# Patient Record
Sex: Male | Born: 1965 | Race: White | Hispanic: No | Marital: Married | Smoking: Never smoker
Health system: Southern US, Community
[De-identification: ages and names within clinical notes are randomized; demographics above are authoritative.]

## PROBLEM LIST (undated history)

## (undated) DIAGNOSIS — K469 Unspecified abdominal hernia without obstruction or gangrene: Secondary | ICD-10-CM

## (undated) DIAGNOSIS — J302 Other seasonal allergic rhinitis: Secondary | ICD-10-CM

## (undated) DIAGNOSIS — K5792 Diverticulitis of intestine, part unspecified, without perforation or abscess without bleeding: Secondary | ICD-10-CM

## (undated) DIAGNOSIS — E119 Type 2 diabetes mellitus without complications: Secondary | ICD-10-CM

## (undated) DIAGNOSIS — Z973 Presence of spectacles and contact lenses: Secondary | ICD-10-CM

## (undated) DIAGNOSIS — I10 Essential (primary) hypertension: Secondary | ICD-10-CM

## (undated) DIAGNOSIS — M1990 Unspecified osteoarthritis, unspecified site: Secondary | ICD-10-CM

## (undated) DIAGNOSIS — S060X9A Concussion with loss of consciousness of unspecified duration, initial encounter: Secondary | ICD-10-CM

## (undated) DIAGNOSIS — Z8719 Personal history of other diseases of the digestive system: Secondary | ICD-10-CM

## (undated) DIAGNOSIS — E785 Hyperlipidemia, unspecified: Secondary | ICD-10-CM

## (undated) DIAGNOSIS — I700 Atherosclerosis of aorta: Secondary | ICD-10-CM

## (undated) DIAGNOSIS — K760 Fatty (change of) liver, not elsewhere classified: Secondary | ICD-10-CM

## (undated) DIAGNOSIS — I2510 Atherosclerotic heart disease of native coronary artery without angina pectoris: Secondary | ICD-10-CM

## (undated) DIAGNOSIS — Z9289 Personal history of other medical treatment: Secondary | ICD-10-CM

## (undated) DIAGNOSIS — T7840XA Allergy, unspecified, initial encounter: Secondary | ICD-10-CM

## (undated) DIAGNOSIS — Z460 Encounter for fitting and adjustment of spectacles and contact lenses: Secondary | ICD-10-CM

## (undated) DIAGNOSIS — S060XAA Concussion with loss of consciousness status unknown, initial encounter: Secondary | ICD-10-CM

## (undated) HISTORY — DX: Atherosclerotic heart disease of native coronary artery without angina pectoris: I25.10

## (undated) HISTORY — PX: TRIGGER FINGER RELEASE: SHX641

## (undated) HISTORY — DX: Allergy, unspecified, initial encounter: T78.40XA

## (undated) HISTORY — PX: KNEE ARTHROSCOPY: SUR90

## (undated) HISTORY — DX: Hyperlipidemia, unspecified: E78.5

## (undated) HISTORY — PX: HAND SURGERY: SHX662

## (undated) HISTORY — PX: CORONARY ANGIOPLASTY: SHX604

## (undated) HISTORY — PX: SHOULDER SURGERY: SHX246

---

## 1998-01-08 ENCOUNTER — Emergency Department (HOSPITAL_COMMUNITY): Admission: EM | Admit: 1998-01-08 | Discharge: 1998-01-08 | Payer: Self-pay | Admitting: Emergency Medicine

## 2003-01-16 ENCOUNTER — Encounter: Payer: Self-pay | Admitting: Family Medicine

## 2003-01-16 ENCOUNTER — Encounter: Admission: RE | Admit: 2003-01-16 | Discharge: 2003-01-16 | Payer: Self-pay | Admitting: Family Medicine

## 2003-01-17 ENCOUNTER — Inpatient Hospital Stay (HOSPITAL_COMMUNITY): Admission: EM | Admit: 2003-01-17 | Discharge: 2003-01-21 | Payer: Self-pay | Admitting: Internal Medicine

## 2006-04-28 ENCOUNTER — Ambulatory Visit (HOSPITAL_COMMUNITY): Admission: RE | Admit: 2006-04-28 | Discharge: 2006-04-28 | Payer: Self-pay | Admitting: Orthopaedic Surgery

## 2007-04-20 HISTORY — PX: INGUINAL HERNIA REPAIR: SUR1180

## 2007-10-06 ENCOUNTER — Ambulatory Visit (HOSPITAL_COMMUNITY): Admission: RE | Admit: 2007-10-06 | Discharge: 2007-10-06 | Payer: Self-pay | Admitting: Surgery

## 2008-03-11 ENCOUNTER — Ambulatory Visit: Payer: Self-pay | Admitting: Family Medicine

## 2008-03-11 DIAGNOSIS — M25562 Pain in left knee: Secondary | ICD-10-CM

## 2008-04-19 HISTORY — PX: ELBOW ARTHROTOMY: SUR103

## 2008-09-09 ENCOUNTER — Ambulatory Visit: Payer: Self-pay | Admitting: Sports Medicine

## 2008-09-09 DIAGNOSIS — M25529 Pain in unspecified elbow: Secondary | ICD-10-CM | POA: Insufficient documentation

## 2008-09-09 DIAGNOSIS — M7700 Medial epicondylitis, unspecified elbow: Secondary | ICD-10-CM

## 2008-09-10 ENCOUNTER — Telehealth (INDEPENDENT_AMBULATORY_CARE_PROVIDER_SITE_OTHER): Payer: Self-pay | Admitting: *Deleted

## 2008-10-01 ENCOUNTER — Ambulatory Visit (HOSPITAL_BASED_OUTPATIENT_CLINIC_OR_DEPARTMENT_OTHER): Admission: RE | Admit: 2008-10-01 | Discharge: 2008-10-01 | Payer: Self-pay | Admitting: Orthopaedic Surgery

## 2010-01-05 ENCOUNTER — Ambulatory Visit (HOSPITAL_COMMUNITY): Admission: RE | Admit: 2010-01-05 | Discharge: 2010-01-05 | Payer: Self-pay | Admitting: Orthopaedic Surgery

## 2010-03-10 ENCOUNTER — Ambulatory Visit (HOSPITAL_BASED_OUTPATIENT_CLINIC_OR_DEPARTMENT_OTHER): Admission: RE | Admit: 2010-03-10 | Discharge: 2010-03-10 | Payer: Self-pay | Admitting: Orthopaedic Surgery

## 2010-05-04 ENCOUNTER — Encounter
Admission: RE | Admit: 2010-05-04 | Discharge: 2010-05-19 | Payer: Self-pay | Source: Home / Self Care | Attending: Orthopaedic Surgery | Admitting: Orthopaedic Surgery

## 2010-05-20 ENCOUNTER — Ambulatory Visit: Payer: 59 | Attending: Orthopaedic Surgery | Admitting: Rehabilitation

## 2010-05-20 DIAGNOSIS — M25669 Stiffness of unspecified knee, not elsewhere classified: Secondary | ICD-10-CM | POA: Insufficient documentation

## 2010-05-20 DIAGNOSIS — M25569 Pain in unspecified knee: Secondary | ICD-10-CM | POA: Insufficient documentation

## 2010-05-20 DIAGNOSIS — IMO0001 Reserved for inherently not codable concepts without codable children: Secondary | ICD-10-CM | POA: Insufficient documentation

## 2010-05-29 ENCOUNTER — Ambulatory Visit: Payer: 59 | Admitting: Rehabilitation

## 2010-06-01 ENCOUNTER — Ambulatory Visit: Payer: 59 | Admitting: Rehabilitation

## 2010-06-03 ENCOUNTER — Encounter: Payer: 59 | Admitting: Rehabilitation

## 2010-06-05 ENCOUNTER — Ambulatory Visit: Payer: 59 | Admitting: Physical Therapy

## 2010-06-05 ENCOUNTER — Encounter: Payer: 59 | Admitting: Rehabilitation

## 2010-06-08 ENCOUNTER — Ambulatory Visit: Payer: 59 | Admitting: Rehabilitation

## 2010-06-10 ENCOUNTER — Encounter: Payer: 59 | Admitting: Rehabilitation

## 2010-06-12 ENCOUNTER — Ambulatory Visit: Payer: 59 | Admitting: Rehabilitation

## 2010-06-15 ENCOUNTER — Ambulatory Visit: Payer: 59 | Admitting: Rehabilitation

## 2010-06-18 ENCOUNTER — Ambulatory Visit: Payer: 59 | Admitting: Rehabilitation

## 2010-06-19 ENCOUNTER — Ambulatory Visit: Payer: 59 | Attending: Orthopaedic Surgery | Admitting: Rehabilitation

## 2010-06-19 DIAGNOSIS — IMO0001 Reserved for inherently not codable concepts without codable children: Secondary | ICD-10-CM | POA: Insufficient documentation

## 2010-06-19 DIAGNOSIS — M25569 Pain in unspecified knee: Secondary | ICD-10-CM | POA: Insufficient documentation

## 2010-06-19 DIAGNOSIS — M25669 Stiffness of unspecified knee, not elsewhere classified: Secondary | ICD-10-CM | POA: Insufficient documentation

## 2010-06-22 ENCOUNTER — Encounter: Payer: 59 | Admitting: Physical Therapy

## 2010-06-30 LAB — BASIC METABOLIC PANEL
BUN: 13 mg/dL (ref 6–23)
CO2: 26 mEq/L (ref 19–32)
Calcium: 9.6 mg/dL (ref 8.4–10.5)
Chloride: 99 mEq/L (ref 96–112)
Creatinine, Ser: 0.84 mg/dL (ref 0.4–1.5)
GFR calc Af Amer: >60 mL/min (ref >60)
Glucose, Bld: 205 mg/dL — ABNORMAL HIGH (ref 70–99)

## 2010-06-30 LAB — GLUCOSE, CAPILLARY: Glucose-Capillary: 147 mg/dL — ABNORMAL HIGH (ref 70–99)

## 2010-07-27 LAB — BASIC METABOLIC PANEL
BUN: 16 mg/dL (ref 6–23)
CO2: 26 mEq/L (ref 19–32)
Chloride: 105 mEq/L (ref 96–112)
GFR calc non Af Amer: >60 mL/min (ref >60)
Glucose, Bld: 121 mg/dL — ABNORMAL HIGH (ref 70–99)
Potassium: 3.9 mEq/L (ref 3.5–5.1)
Sodium: 137 mEq/L (ref 135–145)

## 2010-07-27 LAB — GLUCOSE, CAPILLARY: Glucose-Capillary: 169 mg/dL — ABNORMAL HIGH (ref 70–99)

## 2010-09-01 NOTE — Op Note (Signed)
Stephen Arnold, Stephen Arnold           ACCOUNT NO.:  [id number]   MEDICAL RECORD NO.:  [id number]          PATIENT TYPE:  AMB   LOCATION:  DAY                          FACILITY:  [facility]   PHYSICIAN:  Ardeth Sportsman, MD     DATE OF BIRTH:  03/09/1966   DATE OF PROCEDURE:  DATE OF DISCHARGE:                               OPERATIVE REPORT   PRIMARY CARE PHYSICIAN:  Dr. Manus Gunning.   SURGEON:  Karie Soda, MD.   ASSISTANT:  None.   PREOPERATIVE DIAGNOSES:  1. Recurrent left inguinal hernia.  2. Possible right inguinal hernia.   POSTOPERATIVE DIAGNOSES:  1. Recurrent left indirect and direct inguinal hernia.  2. Right indirect and indirect inguinal hernia.   PROCEDURE PERFORMED:  Laparoscopic bilateral preperitoneal exploration  with repair with mesh.   ANESTHESIA:  1. General anesthesia.  Bilateral ilioinguinal/genitofemoral/cord      nerve blocks.  2. Local anesthetic in a field block around all port sites.   SPECIMENS:  None.   DRAINS:  None.   ESTIMATED BLOOD LOSS:  Less than 20 mL.   COMPLICATIONS:  No major complications.   INDICATIONS:  Stephen Arnold is a 45 year old male who had a left  inguinal hernia repair about 20 years ago and felt a pulling discomfort  and pain.  He had evidence of a left inguinal hernia.  He also on my  examination had evidence of a right inguinal hernia.  He was actually  starting to have a little bit of groin discomfort as well.   The anatomy and physiology of abdominal formation and embryology of  testicular migration was explained.  Pathophysiology of inguinal  herniation with its risks of debilitating pain, strangulation and  incarceration was discussed and recommendation was made for laparoscopic  preperitoneal exploration with bilateral inguinal hernia repair.  The  risks and alternatives were discussed.  Questions were answered.   OPERATIVE FINDINGS:  He had obvious indirect and direct inguinal hernias  with a moderate-sized cord  lipoma on the left side.  There was some  scarring around the inguinal region.  On the right he had small but  definite direct and indirect inguinal hernias as well a larger cord  lipoma on the right.   DESCRIPTION OF PROCEDURE:  Informed consent was confirmed.  The patient  voided just prior to going to the operating room.  He received IV  cefazolin just prior to surgery.  He was positioned supine with both  arms tucked.  He underwent general anesthesia without any difficulty.  Stephen abdomen was clipped, prepped and draped in a sterile fashion.   Entry was gained in the preperitoneal space through an infraumbilical  curvilinear incision.  A nick was made in the anterior rectus fascia  just to the right of midline and a 12-mm port was passed in the  preperitoneal plane just posterior to the right rectus abdominis muscle.  Capnopreperitoneum to 15 mL provided good abdominal insufflation.   Attention was turned towards the left side, which was the obvious side.  The peritoneum was freed off the lateral sidewalls in the bilateral  lower quadrants.  When enough  working space was created, the 5-0 ports  being placed in the left and right midabdomen, dissection on the left  side, I freed the peritoneum laterally.  A window was made between the  anterolateral bladder and the anteromedial true pelvis down to the level  of the obturator foramen.  This helped align the inguinal region  borders.  Peritoneum could be seen crawling up into the internal ring,  consistent with an indirect hernia.  There was a separate swath of  peritoneum crawling up medial to the inferior epigastrics, concerning  for a direct hernia as well.  The direct hernia was skeletonized and  reduced down.  The sac was adherent to Stephen iliac vein.  This was  carefully freed off, staying close to the sac and staying away from the  iliac vein without much difficulty.  The cord structures, including the  vas deferens, were freed off  the hernia sac and the sac was reduced out  of the inguinal canal and peeled back as proximally as possible.  The  peritoneum was freed off laterally and superolaterally.   There was a tear in the peritoneum at the hernia as well as one in the  mid midabdomen and left lateral abdomen peritoneum.  These were closed  using 3-0 Vicryl stitches using intracorporeal suturing.   Attention was turned toward the right side.  Peritoneum was freed off.  A large cord lipoma could be seen going into the internal ring along  with a swath of peritoneum consistent with an indirect inguinal hernia.  This was peeled back and the cord lipoma was brought out in pieces  through the port sites.  A swath could be seen going just medial to the  inferior epigastrics for an indirect defect as well although there was  no major incarceration involved.  Dissection was carried out in a mirror  image fashion.  There was a small tear in the peritoneal hernia sac and  this was controlled using intracorporeal suturing as noted above.   A 15 x 15 cm ultra lightweight polypropylene (Ultra Pro) mesh was used  for each side.  On each side the mesh was cut to a half skull shape.  Mesh was placed in a mirror image fashion such that a medial inferior  flap rested in the true pelvis between the anterolateral bladder and  anteromedial pelvic wall.  The mesh laid well posteriorly, laterally,  superiorly and medially such that there was 3 inches circumferential  coverage around the direct and indirect hernia defects.  The lead points  and hernia sacs were grasped and elevated cephalad as capnopreperitoneum  was evacuated.  The ports were removed.  The fascial defect was closed  using 0 Vicryl stitch.  The skin was closed using 4-0 Monocryl stitch.  A sterile dressing was applied.   The patient was extubated and sent to the recovery room in stable  condition.  I explained the postoperative care to the patient and Stephen Arnold just  prior to surgery and reinforced to Stephen Arnold just after  surgery.  I gave her the prescription for 50 Percocet.  Questions were  answered to her satisfaction.  She expressed understanding and  appreciation.      Ardeth Sportsman, MD  Electronically Signed     SCG/MEDQ  D:  10/06/2007  T:  10/06/2007  Job:  [id number]   cc:   Bryan Lemma. Manus Gunning, M.D.  Fax: [phone]

## 2010-09-01 NOTE — Op Note (Signed)
NAMEJADIAN, Stephen Arnold           ACCOUNT NO.:  [id number]   MEDICAL RECORD NO.:  [id number]          PATIENT TYPE:  AMB   LOCATION:  DSC                          FACILITY:  [facility]   PHYSICIAN:  Lubertha Basque. Dalldorf, M.D.DATE OF BIRTH:  1965/08/14   DATE OF PROCEDURE:  10/01/2008  DATE OF DISCHARGE:                               OPERATIVE REPORT   PREOPERATIVE DIAGNOSIS:  Right elbow lateral epicondylitis.   POSTOPERATIVE DIAGNOSIS:  Right elbow lateral epicondylitis.   PROCEDURE:  Right elbow lateral release.   ANESTHESIA:  General.   ATTENDING SURGEON:  Lubertha Basque. Jerl Santos, MD   ASSISTANT:  Lindwood Qua, PA   INDICATIONS FOR PROCEDURE:  The patient is a 45 year old man with a many-  year history of right elbow lateral aspect pain.  This has persisted  despite oral anti-inflammatories and a therapy program as well as  bracing and several injections, all of which have helped transiently.  He has pain which limits his ability to remain active and at times to  rest and he is offered a lateral release procedure.  Informed operative  consent was obtained after discussion of possible complications  including reaction to anesthesia and infection.   SUMMARY, FINDINGS, AND PROCEDURE:  Under general anesthesia through a  small lateral incision, a longitudinal split was made in the extensor  origin at the lateral epicondyle.  Some devitalized tissue was removed  and this was repaired.   DESCRIPTION OF PROCEDURE:  The patient was taken to the operating suite  where general anesthetic was applied without difficulty.  He was  positioned supine and prepped and draped in the normal sterile fashion.  After administration of IV Kefzol, the right arm was elevated,  exsanguinated, tourniquet inflated about the upper arm.  A lateral  incision was made with dissection down to the extensor origin of the  lateral epicondyle.  This was split longitudinally.  I created anterior  and posterior flaps  and removed some devitalized tissue down to bone.  I  then used a rongeur to freshen the bone.  We allowed 1 limb of this to  slip distally a few millimeters and then repaired the fascial layer with  2-0 Vicryl so as not to allow [place] to slip any further.  The tourniquet  was deflated and a small amount of bleeding was easily controlled with  cautery.  Subcutaneous tissues were reapproximated with 2-0 undyed  Vicryl and skin with nylon.  Some Marcaine was injected followed by  Adaptic and a dry gauze dressing with a loose Ace wrap.  Estimated blood  loss and intraoperative fluids can be obtained from anesthesia records  as can accurate tourniquet time.   DISPOSITION:  The patient was extubated in the operating room and taken  to the recovery room in stable addition.  He is to go home on same day  and follow up in my office next week.  I will contact him by phone  tonight.      Lubertha Basque Jerl Santos, M.D.  Electronically Signed     PGD/MEDQ  D:  10/01/2008  T:  10/02/2008  Job:  [id number]

## 2010-09-04 NOTE — H&P (Signed)
NAME:  Stephen Arnold, Stephen Arnold                     ACCOUNT NO.:  [id number]   MEDICAL RECORD NO.:  [id number]                   PATIENT TYPE:  INP   LOCATION:  0342                                 FACILITY:  [facility]   PHYSICIAN:  Corinna L. Lendell Caprice, MD             DATE OF BIRTH:  January 04, 1966   DATE OF ADMISSION:  01/17/2003  DATE OF DISCHARGE:                                HISTORY & PHYSICAL   CHIEF COMPLAINT:  Abdominal pain.   HISTORY OF PRESENT ILLNESS:  Mr. Blaisdell is a 45 year old white male who  has had about a 10-day history of bloating, epigastric pain, anorexia.  He  denies fevers or chills.  The patient is worse with eating.  He has been  taking Vicodin which improves the pain somewhat.  He vomited this morning  which he attributes to taking the pain medication on an empty stomach.  He  has no previous history of pancreatitis.  He does not drink.   PAST MEDICAL HISTORY:  Seasonal allergic rhinitis.   MEDICATIONS:  Allegra, Vicodin.   SOCIAL HISTORY:  The patient is married.  He does not smoke.  He drinks  occasionally although he had not drank to excess recently.  He occasionally  smokes marijuana.   FAMILY HISTORY:  His father has type 2 diabetes.  His grandfather died of  cancer.  He thinks it is colon cancer.  His mother has COPD and heart  problems.   PAST SURGICAL HISTORY:  Herniorrhaphy and knee surgeries.   REVIEW OF SYSTEMS:  CONSTITUTIONAL:  As above.  HEENT:  No headaches, no  sore throat, no odynophagia.  RESPIRATORY:  No cough.  No shortness of  breath.  GI:  As above.  He has not had any diarrhea or hematemesis.  GU:  No dysuria.  MUSCULOSKELETAL:  No arthralgias or myalgias.  SKIN:  No rash.  PSYCHIATRIC:  No depression.  NEUROLOGICAL:  No seizures.  ENDOCRINE:  No  diabetes.  INFECTIOUS:  He had an upper respiratory infection recently.   PHYSICAL EXAMINATION:  VITAL SIGNS:  Temperature is reportedly 99.8, vital  signs are normal.  GENERAL:  The  patient is well-nourished, well-developed in no acute  distress.  HEENT:  Normocephalic, atraumatic.  Pupils equal, round, reactive to light.  Sclerae nonicteric.  Moist mucous membranes.  NECK:  Supple.  No lymphadenopathy.  LUNGS:  Clear to auscultation bilaterally without wheezes, rhonchi, or  rales.  CARDIOVASCULAR:  Regular rate and rhythm without murmurs, rubs, or gallops.  ABDOMEN:  Normal bowel sounds.  Soft.  Mild epigastric tenderness.  No  rebound tenderness.  No distention.  GENITOURINARY AND RECTAL:  Deferred.  EXTREMITIES:  No clubbing, cyanosis, or edema.  SKIN:  No rash.  PSYCHIATRIC:  Normal affect.  NEUROLOGICAL:  Alert and oriented.  Cranial nerves and sensory motor exam  are intact.   LABORATORY AND ACCESSORY DATA:  His amylase from the 28th was 192, his  lipase was 1251, glucose 145, BUN 14, creatinine 0.9, sodium 138, potassium  4.4, chloride 104, bicarbonate 27, calcium 10, total protein 6.8, albumin  4.3, total bilirubin 0.5, alkaline phosphatase 36, AST 13, ALT 13.  White  blood cell count was 10 with a normal differential.  His hemoglobin was  13.4, hematocrit 38.4, platelet count 322.   Ultrasound of the abdomen today showed a nondilated common duct, excessive  gas in the midline which limits visualization of the pancreas.  On one  image, a suspicion of a dilated pancreatic duct measuring 3.5 mm.  CT of the  abdomen without and with contrast showed faint inflammatory stranding about  the pancreatic head and body consistent with pancreatitis.  No mass or  pseudocysts.   ASSESSMENT AND PLAN:  1. Acute pancreatitis:  The patient is not a heavy drinker.  He has no     gallstones.  I will check a triglyceride level.  The patient will be     admitted, given intravenous fluids, antiemetics, pain medications.  He     will be NPO.  I will check serial amylase and lipase measurements and do     serial abdominal examinations.  2. Hyperglycemia.  This will be  monitored and I will check a hemoglobin A1C     if it is elevated.                                               Corinna L. Lendell Caprice, MD    CLS/MEDQ  D:  01/17/2003  T:  01/17/2003  Job:  [id number]   cc:   Bryan Lemma. Manus Gunning, M.D.  [address] [place]  [place] [address]  Fax: [phone]

## 2010-09-04 NOTE — Discharge Summary (Signed)
NAME:  PINCHOS, TOPEL                     ACCOUNT NO.:  [id number]   MEDICAL RECORD NO.:  [id number]                   PATIENT TYPE:  INP   LOCATION:  0342                                 FACILITY[facility]  [facility]   PHYSICIAN:  Corinna L. Lendell Caprice, MD             DATE OF BIRTH:  February 23, 1966   DATE OF ADMISSION:  01/17/2003  DATE OF DISCHARGE:  01/21/2003                                 DISCHARGE SUMMARY   DIAGNOSES:  1. Idiopathic acute pancreatitis.  2. Resolved hyperglycemia.  3. Seasonal allergic rhinitis.   DISCHARGE MEDICATIONS:  1. Phenergan 25 mg p.o. q.6h. p.r.n. nausea/vomiting.  2. Vicodin one to two p.o. q.4h. p.r.n. pain.   ACTIVITY:  Ad lib.   DIET:  Low fat, no alcohol.   FOLLOWUP:  Follow up with Dr. Manus Gunning in two to three weeks.   CONDITION ON DISCHARGE:  Stable.   LABORATORY DATA:  Lipase two days prior to admission was over 1200.  Upon  admission his lipase was 125.  His amylase was normal.  Liver function tests  were completely normal.  Triglyceride level 86.  Basic metabolic panel  normal.  CBC normal.  Serum glucose done two days prior to admission was  about 145.  Special studies on radiology:  Ultrasound done prior to  admission at [facility] showed no gallbladder or liver  pathology, pancreas was not well seen.  CT of the abdomen and pelvis done  prior to admission showed faint inflammatory stranding around the pancreatic  head and body, no masses or pseudocysts were seen.   HISTORY AND HOSPITAL COURSE:  Mr. Borowski is a very pleasant 45 year old  white male who had a several week history of epigastric pain.  He also had  several episodes of vomiting and went to see his primary care physician who  ordered basic blood work as well as liver function tests, amylase, and  lipase.  His lipase was as above.  Ultrasound and CAT scan showed no  evidence of gallstones or pancreatic masses.  He was admitted to the floor,  given IV fluids, IV  antiemetics, and pain medications and made n.p.o.  He  had serial lipase levels drawn.  At the time of discharge, his lipase was  93.  His pain improved and his diet was advanced slowly.  At the time of  discharge  he was tolerating a low fat diet and had minimal pain.  He reports that he  drinks occasionally, but has not been drinking heavily and there was no  history of alcoholism or recent binging.  If this recurs, consider doing an  ERCP at some point.                                               Corinna L. Lendell Caprice, MD  CLS/MEDQ  D:  01/21/2003  T:  01/21/2003  Job:  [id number]   cc:   Bryan Lemma. Manus Gunning, M.D.  [address] [place]  [place] [address]  Fax: [phone]

## 2011-01-14 LAB — BASIC METABOLIC PANEL
CO2: 28
Chloride: 102
Creatinine, Ser: 0.59
GFR calc Af Amer: >60
Sodium: 139

## 2011-01-14 LAB — HEMOGLOBIN AND HEMATOCRIT, BLOOD
HCT: 37.6 — ABNORMAL LOW
Hemoglobin: 12.8 — ABNORMAL LOW

## 2011-02-01 ENCOUNTER — Ambulatory Visit (INDEPENDENT_AMBULATORY_CARE_PROVIDER_SITE_OTHER): Payer: 59 | Admitting: Physician Assistant

## 2011-02-01 ENCOUNTER — Telehealth: Payer: Self-pay

## 2011-02-01 DIAGNOSIS — R079 Chest pain, unspecified: Secondary | ICD-10-CM

## 2011-02-01 NOTE — Telephone Encounter (Signed)
I spoke with pt anda divsed him I am not showing where our office attempted to call him but try Dr. Wende Mott office since he did have a stress test done. Pt verbalized understanding and had no questions

## 2011-02-01 NOTE — Progress Notes (Signed)
   Stephen Arnold is a 45 y.o. male presents for ETT at request of PCP.  He has a h/o DM2 x 4 years.  No h/o CAD.  No HTN or HLP.  Non smoker.  No FHx of CAD.  He notes recent episodes of chest pressure with emotional stress.  Has had some chest discomfort with exertion.  He is able to exert himself without pain as well.  No dyspnea.  No syncope.  Exercise Treadmill Test  Pre-Exercise Testing Evaluation Rhythm: normal sinus  Rate: 81   PR:  .15 QRS:  .09  QT:  .35 QTc: .40     Test  Exercise Tolerance Test Ordering MD: Dr. Thea Silversmith  Interpreting MD:  Tereso Newcomer, PA-C  Unique Test No: 1  Treadmill:  1  Indication for ETT: chest pain - rule out ischemia  Contraindication to ETT: No   Stress Modality: exercise - treadmill  Cardiac Imaging Performed: non   Protocol: standard Bruce - maximal  Max BP:  184/94  Max MPHR (bpm):  174 85% MPR (bpm):  147  MPHR obtained (bpm): 166 % MPHR obtained:  96  Reached 85% MPHR (min:sec) 6:14 Total Exercise Time (min-sec):  8:36  Workload in METS:  13.4 Borg Scale: 17  Reason ETT Terminated:  patient's desire to stop    ST Segment Analysis At Rest: normal ST segments - no evidence of significant ST depression With Exercise: no evidence of significant ST depression  Other Information Arrhythmia:  No Angina during ETT:  absent (0) Quality of ETT:  diagnostic  ETT Interpretation:  normal - no evidence of ischemia by ST analysis  Comments: Good exercise tolerance. No chest pain. Patient stopped due to fatigue and knee pain. Normal BP response to exercise. No ST-T changes to suggest ischemia.   Recommendations: Follow up with PCP as directed.

## 2011-03-15 ENCOUNTER — Encounter: Payer: Self-pay | Admitting: Physician Assistant

## 2011-08-17 ENCOUNTER — Encounter: Payer: Self-pay | Admitting: Sports Medicine

## 2011-08-17 ENCOUNTER — Ambulatory Visit (INDEPENDENT_AMBULATORY_CARE_PROVIDER_SITE_OTHER): Payer: 59 | Admitting: Sports Medicine

## 2011-08-17 VITALS — BP 133/89 | HR 89 | Ht 72.0 in | Wt 215.0 lb

## 2011-08-17 DIAGNOSIS — M2170 Unequal limb length (acquired), unspecified site: Secondary | ICD-10-CM

## 2011-08-17 DIAGNOSIS — IMO0002 Reserved for concepts with insufficient information to code with codable children: Secondary | ICD-10-CM

## 2011-08-17 DIAGNOSIS — M25569 Pain in unspecified knee: Secondary | ICD-10-CM

## 2011-08-17 MED ORDER — TRAMADOL HCL 50 MG PO TABS: 50.0000 mg | ORAL_TABLET | Freq: Four times a day (QID) | ORAL | Status: AC | PRN

## 2011-08-17 NOTE — Assessment & Plan Note (Signed)
We need to make a correction for his leg length difference since he is getting into more varus change and some knee flexion on the left while walking he has functionally A1 to 2 cm short left leg  Plan is to make orthotics to support both arches but also to see if we correct the leg length difference on his return in 1 month

## 2011-08-17 NOTE — Assessment & Plan Note (Signed)
Began tramadol 50 mg 2-3 times daily  We will see if he can reduce his use of ibuprofen  Start glucosamine 1500 mg and chondroitin 1200 mg daily

## 2011-08-17 NOTE — Assessment & Plan Note (Signed)
Think he she has symptoms to suggest a possible new medial meniscus tear   I am concerned that his meniscus injuries most recently are degenerative  His lack of response to his 2011 surgery may suggest that he felt more significant arthritis then is clear from his examination  We will use patellar compression  He needs cushioned insoles to better support his knees particularly with the collapse of his arch bilaterally

## 2011-08-17 NOTE — Patient Instructions (Signed)
Please schedule a follow up appointment for custom orthotics in 1 month  Start tramadol 2-3 times per day as needed for knee pain  Try glucosamine/chondroitin - [employer]'s [place] brand is good.  Dosage is 1500/1200 mg daily  Thank you for seeing [place] today!

## 2011-08-17 NOTE — Progress Notes (Signed)
  Subjective:    Patient ID: Stephen Arnold, male    DOB: [birth year], 46 y.o.   MRN: [id number]  HPI  Pt presents to clinic for evaluation of lt knee pain.  Hx of 3 prior lt knee surgeries. 1980s and 2000 had knees scoped.  2011 had meniscal repair. Lt was post horn of lat meniscus.  Done by Dr Jerl Santos.  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 States his knee pain is now worse than it was before he had the surgeries.  Has significant pain and swelling after doing any type of activity. Takes total of 1600 mg of advil daily.  Had been on vicodin after surgery, but stopped this because he felt it was too strong.  He has stopped all activity because of swelling and pain - really all in left Can't do even easy sports activity even with brace has to prop up and ice after  Review of Systems     Objective:   Physical Exam NAD  Seated gets full extension bilat knees Mild atrophy over VMO on lt Bounce home test neg bilat Varus shift L>R Rt knee 150 deg flexion Lt knee 145 deg flexion Good hip flexion strength on lt Mcmurray's causes pain medially on lt MCL and LCL stable on lt Lachman neg on lt Mild puffiness popliteal space on lt Long arch dropped bilat moderately Standing has more varus shift on lt Lying position- lt leg 1 cm shorter than rt  Lt hip ROM normal  Walking gait- turns both feet out Trendelenburg to the left         Assessment & Plan:

## 2011-09-16 ENCOUNTER — Encounter: Payer: 59 | Admitting: Sports Medicine

## 2011-09-22 ENCOUNTER — Encounter: Payer: 59 | Admitting: Sports Medicine

## 2012-04-19 HISTORY — PX: SHOULDER ARTHROSCOPY W/ ROTATOR CUFF REPAIR: SHX2400

## 2012-04-19 HISTORY — PX: TOOTH EXTRACTION: SUR596

## 2012-07-18 ENCOUNTER — Other Ambulatory Visit (HOSPITAL_COMMUNITY): Payer: Self-pay | Admitting: Orthopaedic Surgery

## 2012-07-18 DIAGNOSIS — M25512 Pain in left shoulder: Secondary | ICD-10-CM

## 2012-07-20 ENCOUNTER — Ambulatory Visit (HOSPITAL_COMMUNITY)
Admission: RE | Admit: 2012-07-20 | Discharge: 2012-07-20 | Disposition: A | Discharge status: Home / Self Care | Payer: 59 | Source: Ambulatory Visit | Attending: Orthopaedic Surgery | Admitting: Orthopaedic Surgery

## 2012-07-20 DIAGNOSIS — M25512 Pain in left shoulder: Secondary | ICD-10-CM

## 2012-08-08 ENCOUNTER — Other Ambulatory Visit (HOSPITAL_COMMUNITY): Payer: Self-pay | Admitting: Orthopaedic Surgery

## 2012-08-08 DIAGNOSIS — M25512 Pain in left shoulder: Secondary | ICD-10-CM

## 2012-08-10 ENCOUNTER — Ambulatory Visit (HOSPITAL_COMMUNITY)
Admission: RE | Admit: 2012-08-10 | Discharge: 2012-08-10 | Disposition: A | Discharge status: Home / Self Care | Payer: 59 | Source: Ambulatory Visit | Attending: Orthopaedic Surgery | Admitting: Orthopaedic Surgery

## 2012-08-10 DIAGNOSIS — M25512 Pain in left shoulder: Secondary | ICD-10-CM

## 2012-08-15 ENCOUNTER — Other Ambulatory Visit (HOSPITAL_COMMUNITY): Payer: Self-pay | Admitting: Orthopaedic Surgery

## 2012-08-15 DIAGNOSIS — R52 Pain, unspecified: Secondary | ICD-10-CM

## 2012-08-15 DIAGNOSIS — R531 Weakness: Secondary | ICD-10-CM

## 2012-08-23 ENCOUNTER — Ambulatory Visit (HOSPITAL_COMMUNITY)
Admission: RE | Admit: 2012-08-23 | Discharge: 2012-08-23 | Disposition: A | Discharge status: Home / Self Care | Payer: 59 | Source: Ambulatory Visit | Attending: Orthopaedic Surgery | Admitting: Orthopaedic Surgery

## 2012-08-23 ENCOUNTER — Encounter (HOSPITAL_COMMUNITY): Payer: Self-pay

## 2012-08-23 DIAGNOSIS — M25519 Pain in unspecified shoulder: Secondary | ICD-10-CM | POA: Insufficient documentation

## 2012-08-23 DIAGNOSIS — R531 Weakness: Secondary | ICD-10-CM

## 2012-08-23 DIAGNOSIS — E119 Type 2 diabetes mellitus without complications: Secondary | ICD-10-CM | POA: Insufficient documentation

## 2012-08-23 DIAGNOSIS — M19019 Primary osteoarthritis, unspecified shoulder: Secondary | ICD-10-CM | POA: Insufficient documentation

## 2012-08-23 DIAGNOSIS — R52 Pain, unspecified: Secondary | ICD-10-CM

## 2012-08-23 HISTORY — DX: Type 2 diabetes mellitus without complications: E11.9

## 2012-08-23 MED ORDER — MIDAZOLAM HCL 2 MG/2ML IJ SOLN
INTRAMUSCULAR | Status: AC
  Administered 2012-08-23: 2 mg via INTRAVENOUS
  Filled 2012-08-23: qty 10

## 2012-08-23 MED ORDER — MIDAZOLAM HCL 2 MG/2ML IJ SOLN
1.0000 mg | INTRAMUSCULAR | Status: DC | PRN
  Administered 2012-08-23 (×3): 2 mg via INTRAVENOUS

## 2012-08-23 MED ORDER — FENTANYL CITRATE 0.05 MG/ML IJ SOLN
INTRAMUSCULAR | Status: AC
  Administered 2012-08-23: 50 ug via INTRAVENOUS
  Filled 2012-08-23: qty 2

## 2012-08-23 MED ORDER — FENTANYL CITRATE 0.05 MG/ML IJ SOLN
25.0000 ug | INTRAMUSCULAR | Status: DC | PRN
  Administered 2012-08-23 (×2): 50 ug via INTRAVENOUS

## 2012-08-23 NOTE — ED Notes (Signed)
Discharge instructions reviewed with patient and his wife and a copy given

## 2012-08-23 NOTE — ED Notes (Signed)
MRI exam completed.

## 2012-08-23 NOTE — H&P (Signed)
Stephen Arnold is an 47 y.o. male.   Chief Complaint: Left shoulder pain Scheduled for left shoulder MRI with sedation Pt unable to tolerate positioning in scanner due to pain HPI: DM  Past Medical History  Diagnosis Date  . Diabetes mellitus without complication     Past Surgical History  Procedure Laterality Date  . Hernia repair Bilateral   . Knee arthroscopy Bilateral   . Hand surgery Right     History reviewed. No pertinent family history. Social History:  reports that he has never smoked. He has never used smokeless tobacco. His alcohol and drug histories are not on file.  Allergies: No Known Allergies   (Not in a hospital admission)  No results found for this or any previous visit (from the past 48 hour(s)). No results found.  Review of Systems  Constitutional: Negative for fever.  Respiratory: Negative for shortness of breath.   Cardiovascular: Negative for chest pain.  Gastrointestinal: Negative for nausea, vomiting and abdominal pain.  Musculoskeletal: Positive for joint pain.       Lt shoulder pain  Neurological: Negative for weakness and headaches.    Blood pressure 122/83, pulse 75, temperature 97.6 F (36.4 C), SpO2 100.00%. Physical Exam  Constitutional: He is oriented to person, place, and time. He appears well-developed and well-nourished.  Cardiovascular: Normal rate, regular rhythm and normal heart sounds.   No murmur heard. Respiratory: Effort normal and breath sounds normal. He has no wheezes.  GI: Soft. Bowel sounds are normal. There is no tenderness.  Musculoskeletal: Normal range of motion.  Left shoulder pain  Neurological: He is alert and oriented to person, place, and time.  Skin: Skin is warm and dry.  Psychiatric: He has a normal mood and affect. His behavior is normal. Judgment and thought content normal.     Assessment/Plan Left shoulder pain Scheduled for MRI with sedation Pt aware of procedure benefits and risks and  agreeable to proceed Consent signed and in chart  Marry Kusch A 08/23/2012, 12:39 PM

## 2012-08-23 NOTE — ED Notes (Signed)
MRI exam started 

## 2012-11-16 ENCOUNTER — Other Ambulatory Visit: Payer: Self-pay | Admitting: Orthopaedic Surgery

## 2012-12-05 ENCOUNTER — Encounter (HOSPITAL_BASED_OUTPATIENT_CLINIC_OR_DEPARTMENT_OTHER): Payer: Self-pay | Admitting: *Deleted

## 2012-12-05 NOTE — Progress Notes (Signed)
Pt has had several surgeries here-to come by for bmet-ekg-has no cardiac or resp problems-diabetic controlled

## 2012-12-06 NOTE — H&P (Signed)
Stephen Arnold is an 47 y.o. male.   Chief Complaint: Left shoulder pain HPI: Stephen Arnold has been having left shoulder pain for some time.  He is having trouble sleeping at nighttime and using his arm comfortably.  Any work at shoulder level height or higher is very painful for him.  He has had 3 different injections which helped only temporarily.  A recent MRI scan was positive for a.c. Joint degeneration and subacromial bursitis/rotator cuff tendinitis.  Have discussed proceeding with a shoulder arthroscopy, acromial decompression and a.c. Resection.  My hope is that this will regain his function and relieve his pain.  Past Medical History  Diagnosis Date  . Diabetes mellitus without complication   . Seasonal allergies   . Contact lens/glasses fitting     wears contacts  . GERD (gastroesophageal reflux disease)     occ-no meds    Past Surgical History  Procedure Laterality Date  . Knee arthroscopy  2011    lt  . Hand surgery Right     rt small finger  . Hernia repair Bilateral 2009  . Knee arthroscopy      right  . Elbow arthrotomy  2010    rt  . Tooth extraction  2014    History reviewed. No pertinent family history. Social History:  reports that he has never smoked. He has never used smokeless tobacco. He reports that  drinks alcohol. He reports that he does not use illicit drugs.  Allergies: No Known Allergies  No prescriptions prior to admission    No results found for this or any previous visit (from the past 48 hour(s)). No results found.  Review of Systems  Musculoskeletal: Positive for joint pain.  All other systems reviewed and are negative.    Height 6' (1.829 m), weight 97.523 kg (215 lb). Physical Exam  Constitutional: He is oriented to person, place, and time. He appears well-nourished.  HENT:  Head: Atraumatic.  Eyes: Conjunctivae are normal.  Neck: Neck supple.  Cardiovascular: Regular rhythm.   Respiratory: Breath sounds normal.  GI: Bowel  sounds are normal.  Musculoskeletal:  Left shoulder exam: Range of motion is good but he has.  Painful impingement in 2 positions.  Rotator cuff strength is good but very painful with testing.  There is pain at the a.c. Joint with cross chest test maneuver and direct pressure.  Elbow and wrist motion normal.  Neurological: He is oriented to person, place, and time.  Skin: Skin is dry.  Psychiatric: He has a normal mood and affect.     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 Plan: Stephen Arnold is having continued shoulder pain despite injections mentioned above.  We have discussed, Resectinghis a.c. Joint spurs and also doing a subacromial decompression to relieve his pain and improve his function.  We did review the risks of anesthesia, infection associated with outpatient  Shoulder arthroscopy.  Stephen Arnold R 12/06/2012, 6:11 PM

## 2012-12-11 ENCOUNTER — Encounter (HOSPITAL_BASED_OUTPATIENT_CLINIC_OR_DEPARTMENT_OTHER)
Admission: RE | Admit: 2012-12-11 | Discharge: 2012-12-11 | Disposition: A | Discharge status: Home / Self Care | Payer: 59 | Source: Ambulatory Visit | Attending: Orthopaedic Surgery | Admitting: Orthopaedic Surgery

## 2012-12-11 LAB — BASIC METABOLIC PANEL
Calcium: 9.7 mg/dL (ref 8.4–10.5)
Chloride: 98 mEq/L (ref 96–112)
GFR calc non Af Amer: >90 mL/min (ref >90)
Glucose, Bld: 377 mg/dL — ABNORMAL HIGH (ref 70–99)
Sodium: 137 mEq/L (ref 135–145)

## 2012-12-11 NOTE — Progress Notes (Signed)
Discussed BMP glucose of 377 with Dr. Sampson Goon who plans to check CBG DOS in pre op and readdress at that time.

## 2012-12-12 ENCOUNTER — Encounter (HOSPITAL_BASED_OUTPATIENT_CLINIC_OR_DEPARTMENT_OTHER): Payer: Self-pay | Admitting: *Deleted

## 2012-12-12 ENCOUNTER — Encounter (HOSPITAL_BASED_OUTPATIENT_CLINIC_OR_DEPARTMENT_OTHER)
Admission: RE | Disposition: A | Discharge status: Home / Self Care | Payer: Self-pay | Source: Ambulatory Visit | Attending: Orthopaedic Surgery

## 2012-12-12 ENCOUNTER — Ambulatory Visit (HOSPITAL_BASED_OUTPATIENT_CLINIC_OR_DEPARTMENT_OTHER)
Admission: RE | Admit: 2012-12-12 | Discharge: 2012-12-12 | Disposition: A | Discharge status: Home / Self Care | Payer: 59 | Source: Ambulatory Visit | Attending: Orthopaedic Surgery | Admitting: Orthopaedic Surgery

## 2012-12-12 ENCOUNTER — Ambulatory Visit (HOSPITAL_BASED_OUTPATIENT_CLINIC_OR_DEPARTMENT_OTHER): Payer: 59 | Admitting: *Deleted

## 2012-12-12 DIAGNOSIS — M7542 Impingement syndrome of left shoulder: Secondary | ICD-10-CM

## 2012-12-12 DIAGNOSIS — E119 Type 2 diabetes mellitus without complications: Secondary | ICD-10-CM | POA: Insufficient documentation

## 2012-12-12 DIAGNOSIS — Z01812 Encounter for preprocedural laboratory examination: Secondary | ICD-10-CM | POA: Insufficient documentation

## 2012-12-12 DIAGNOSIS — Z01810 Encounter for preprocedural cardiovascular examination: Secondary | ICD-10-CM | POA: Insufficient documentation

## 2012-12-12 DIAGNOSIS — M19019 Primary osteoarthritis, unspecified shoulder: Secondary | ICD-10-CM | POA: Insufficient documentation

## 2012-12-12 DIAGNOSIS — M25819 Other specified joint disorders, unspecified shoulder: Secondary | ICD-10-CM | POA: Insufficient documentation

## 2012-12-12 HISTORY — DX: Other seasonal allergic rhinitis: J30.2

## 2012-12-12 HISTORY — DX: Encounter for fitting and adjustment of spectacles and contact lenses: Z46.0

## 2012-12-12 LAB — POCT HEMOGLOBIN-HEMACUE: Hemoglobin: 10.7 g/dL — ABNORMAL LOW (ref 13.0–17.0)

## 2012-12-12 SURGERY — SHOULDER ARTHROSCOPY WITH SUBACROMIAL DECOMPRESSION AND DISTAL CLAVICLE EXCISION
Anesthesia: Regional | Site: Shoulder | Laterality: Left | Wound class: Clean

## 2012-12-12 MED ORDER — SODIUM CHLORIDE 0.9 % IR SOLN
Status: DC | PRN
  Administered 2012-12-12: 9000 mL

## 2012-12-12 MED ORDER — FENTANYL CITRATE 0.05 MG/ML IJ SOLN
INTRAMUSCULAR | Status: DC | PRN
  Administered 2012-12-12 (×3): 25 ug via INTRAVENOUS

## 2012-12-12 MED ORDER — LACTATED RINGERS IV SOLN: INTRAVENOUS | Status: DC

## 2012-12-12 MED ORDER — OXYCODONE HCL 5 MG/5ML PO SOLN: 5.0000 mg | Freq: Once | ORAL | Status: DC | PRN

## 2012-12-12 MED ORDER — ONDANSETRON HCL 4 MG/2ML IJ SOLN
INTRAMUSCULAR | Status: DC | PRN
  Administered 2012-12-12: 4 mg via INTRAVENOUS

## 2012-12-12 MED ORDER — HYDROMORPHONE HCL PF 1 MG/ML IJ SOLN: 0.2500 mg | INTRAMUSCULAR | Status: DC | PRN

## 2012-12-12 MED ORDER — BUPIVACAINE-EPINEPHRINE PF 0.5-1:200000 % IJ SOLN
INTRAMUSCULAR | Status: DC | PRN
  Administered 2012-12-12: 30 mL

## 2012-12-12 MED ORDER — OXYCODONE HCL 5 MG PO TABS: 5.0000 mg | ORAL_TABLET | Freq: Once | ORAL | Status: DC | PRN

## 2012-12-12 MED ORDER — DEXAMETHASONE SODIUM PHOSPHATE 4 MG/ML IJ SOLN
INTRAMUSCULAR | Status: DC | PRN
  Administered 2012-12-12: 10 mg via INTRAVENOUS

## 2012-12-12 MED ORDER — CHLORHEXIDINE GLUCONATE 4 % EX LIQD: 60.0000 mL | Freq: Once | CUTANEOUS | Status: DC

## 2012-12-12 MED ORDER — PROPOFOL 10 MG/ML IV BOLUS
INTRAVENOUS | Status: DC | PRN
  Administered 2012-12-12: 100 mg via INTRAVENOUS
  Administered 2012-12-12: 50 mg via INTRAVENOUS
  Administered 2012-12-12: 200 mg via INTRAVENOUS
  Administered 2012-12-12: 100 mg via INTRAVENOUS

## 2012-12-12 MED ORDER — FENTANYL CITRATE 0.05 MG/ML IJ SOLN
50.0000 ug | INTRAMUSCULAR | Status: DC | PRN
  Administered 2012-12-12: 100 ug via INTRAVENOUS

## 2012-12-12 MED ORDER — GLYCOPYRROLATE 0.2 MG/ML IJ SOLN
INTRAMUSCULAR | Status: DC | PRN
  Administered 2012-12-12: 0.2 mg via INTRAVENOUS

## 2012-12-12 MED ORDER — LACTATED RINGERS IV SOLN
INTRAVENOUS | Status: DC
  Administered 2012-12-12 (×2): via INTRAVENOUS

## 2012-12-12 MED ORDER — CEFAZOLIN SODIUM-DEXTROSE 2-3 GM-% IV SOLR
2.0000 g | INTRAVENOUS | Status: AC
  Administered 2012-12-12: 2 g via INTRAVENOUS

## 2012-12-12 MED ORDER — SUCCINYLCHOLINE CHLORIDE 20 MG/ML IJ SOLN
INTRAMUSCULAR | Status: DC | PRN
  Administered 2012-12-12: 100 mg via INTRAVENOUS

## 2012-12-12 MED ORDER — MIDAZOLAM HCL 2 MG/2ML IJ SOLN
1.0000 mg | INTRAMUSCULAR | Status: DC | PRN
  Administered 2012-12-12: 2 mg via INTRAVENOUS

## 2012-12-12 MED ORDER — OXYCODONE-ACETAMINOPHEN 5-325 MG PO TABS: 1.0000 | ORAL_TABLET | Freq: Four times a day (QID) | ORAL | Status: DC | PRN

## 2012-12-12 SURGICAL SUPPLY — 75 items
ADH SKN CLS APL DERMABOND .7 (GAUZE/BANDAGES/DRESSINGS)
APL SKNCLS STERI-STRIP NONHPOA (GAUZE/BANDAGES/DRESSINGS)
BENZOIN TINCTURE PRP APPL 2/3 (GAUZE/BANDAGES/DRESSINGS) IMPLANT
BLADE CUDA 5.5 (BLADE) IMPLANT
BLADE GREAT WHITE 4.2 (BLADE) ×2 IMPLANT
BLADE SURG 15 STRL LF DISP TIS (BLADE) IMPLANT
BLADE SURG 15 STRL SS (BLADE)
BUR VERTEX HOODED 4.5 (BURR) ×2 IMPLANT
CANISTER OMNI JUG 16 LITER (MISCELLANEOUS) IMPLANT
CANISTER SUCTION 2500CC (MISCELLANEOUS) IMPLANT
CANNULA SHOULDER 7CM (CANNULA) ×2 IMPLANT
CANNULA TWIST IN 8.25X7CM (CANNULA) IMPLANT
DECANTER SPIKE VIAL GLASS SM (MISCELLANEOUS) IMPLANT
DERMABOND ADVANCED (GAUZE/BANDAGES/DRESSINGS)
DERMABOND ADVANCED .7 DNX12 (GAUZE/BANDAGES/DRESSINGS) IMPLANT
DRAPE STERI 35X30 U-POUCH (DRAPES) ×2 IMPLANT
DRAPE U-SHAPE 47X51 STRL (DRAPES) ×2 IMPLANT
DRAPE U-SHAPE 76X120 STRL (DRAPES) ×4 IMPLANT
DRSG EMULSION OIL 3X3 NADH (GAUZE/BANDAGES/DRESSINGS) ×2 IMPLANT
DRSG PAD ABDOMINAL 8X10 ST (GAUZE/BANDAGES/DRESSINGS) ×2 IMPLANT
DURAPREP 26ML APPLICATOR (WOUND CARE) ×2 IMPLANT
ELECT MENISCUS 165MM 90D (ELECTRODE) IMPLANT
ELECT REM PT RETURN 9FT ADLT (ELECTROSURGICAL) ×2
ELECTRODE REM PT RTRN 9FT ADLT (ELECTROSURGICAL) ×1 IMPLANT
GLOVE BIO SURGEON STRL SZ 6.5 (GLOVE) ×2 IMPLANT
GLOVE BIO SURGEON STRL SZ7 (GLOVE) ×2 IMPLANT
GLOVE BIO SURGEON STRL SZ8.5 (GLOVE) ×2 IMPLANT
GLOVE BIOGEL PI IND STRL 7.5 (GLOVE) ×1 IMPLANT
GLOVE BIOGEL PI IND STRL 8 (GLOVE) ×1 IMPLANT
GLOVE BIOGEL PI IND STRL 8.5 (GLOVE) ×1 IMPLANT
GLOVE BIOGEL PI INDICATOR 7.5 (GLOVE) ×1
GLOVE BIOGEL PI INDICATOR 8 (GLOVE) ×1
GLOVE BIOGEL PI INDICATOR 8.5 (GLOVE) ×1
GLOVE EXAM NITRILE MD LF STRL (GLOVE) ×2 IMPLANT
GLOVE SS BIOGEL STRL SZ 8 (GLOVE) ×1 IMPLANT
GLOVE SUPERSENSE BIOGEL SZ 8 (GLOVE) ×1
GLOVE SURG SS PI 7.0 STRL IVOR (GLOVE) ×2 IMPLANT
GOWN PREVENTION PLUS XLARGE (GOWN DISPOSABLE) ×4 IMPLANT
GOWN PREVENTION PLUS XXLARGE (GOWN DISPOSABLE) ×6 IMPLANT
IV NS IRRIG 3000ML ARTHROMATIC (IV SOLUTION) ×6 IMPLANT
NDL SUT 6 .5 CRC .975X.05 MAYO (NEEDLE) IMPLANT
NEEDLE MAYO TAPER (NEEDLE)
NEEDLE SCORPION MULTI FIRE (NEEDLE) IMPLANT
NS IRRIG 1000ML POUR BTL (IV SOLUTION) IMPLANT
PACK ARTHROSCOPY DSU (CUSTOM PROCEDURE TRAY) ×2 IMPLANT
PACK BASIN DAY SURGERY FS (CUSTOM PROCEDURE TRAY) ×2 IMPLANT
PASSER SUT SWANSON 36MM LOOP (INSTRUMENTS) IMPLANT
PENCIL BUTTON HOLSTER BLD 10FT (ELECTRODE) IMPLANT
SET ARTHROSCOPY TUBING (MISCELLANEOUS) ×2
SET ARTHROSCOPY TUBING LN (MISCELLANEOUS) ×1 IMPLANT
SHEET MEDIUM DRAPE 40X70 STRL (DRAPES) ×2 IMPLANT
SLEEVE SCD COMPRESS KNEE MED (MISCELLANEOUS) IMPLANT
SLING ARM FOAM STRAP LRG (SOFTGOODS) ×2 IMPLANT
SLING ARM FOAM STRAP MED (SOFTGOODS) IMPLANT
SLING ARM FOAM STRAP SML (SOFTGOODS) IMPLANT
SLING ARM FOAM STRAP XLG (SOFTGOODS) ×2 IMPLANT
SPONGE GAUZE 4X4 12PLY (GAUZE/BANDAGES/DRESSINGS) ×2 IMPLANT
SPONGE LAP 4X18 X RAY DECT (DISPOSABLE) IMPLANT
STRIP CLOSURE SKIN 1/2X4 (GAUZE/BANDAGES/DRESSINGS) IMPLANT
SUCTION FRAZIER TIP 10 FR DISP (SUCTIONS) IMPLANT
SUT ETHIBOND 2 OS 4 DA (SUTURE) IMPLANT
SUT ETHILON 3 0 PS 1 (SUTURE) ×2 IMPLANT
SUT FIBERWIRE #2 38 T-5 BLUE (SUTURE)
SUT PDS AB 2-0 CT2 27 (SUTURE) IMPLANT
SUT VIC AB 0 SH 27 (SUTURE) IMPLANT
SUT VIC AB 2-0 SH 27 (SUTURE)
SUT VIC AB 2-0 SH 27XBRD (SUTURE) IMPLANT
SUT VICRYL 4-0 PS2 18IN ABS (SUTURE) IMPLANT
SUTURE FIBERWR #2 38 T-5 BLUE (SUTURE) IMPLANT
SYR BULB 3OZ (MISCELLANEOUS) IMPLANT
TOWEL OR 17X24 6PK STRL BLUE (TOWEL DISPOSABLE) ×2 IMPLANT
TOWEL OR NON WOVEN STRL DISP B (DISPOSABLE) IMPLANT
WAND STAR VAC 90 (SURGICAL WAND) ×2 IMPLANT
WATER STERILE IRR 1000ML POUR (IV SOLUTION) ×2 IMPLANT
YANKAUER SUCT BULB TIP NO VENT (SUCTIONS) IMPLANT

## 2012-12-12 NOTE — Interval H&P Note (Signed)
History and Physical Interval Note:  12/12/2012 12:59 PM  Stephen Arnold  has presented today for surgery, with the diagnosis of LEFT IMPINGEMENT SYNDROME AND [facility] JOINT ARTHRITIS  The various methods of treatment have been discussed with the patient and family. After consideration of risks, benefits and other options for treatment, the patient has consented to  Procedure(s): ARTHROSCOPY SHOULDER (Left) as a surgical intervention .  The patient's history has been reviewed, patient examined, no change in status, stable for surgery.  I have reviewed the patient's chart and labs.  Questions were answered to the patient's satisfaction.     [name]

## 2012-12-12 NOTE — Op Note (Signed)
#  [id number] 

## 2012-12-12 NOTE — Anesthesia Procedure Notes (Addendum)
Anesthesia Regional Block:  Interscalene brachial plexus block  Pre-Anesthetic Checklist: ,, timeout performed, Correct Patient, Correct Site, Correct Laterality, Correct Procedure, Correct Position, site marked, Risks and benefits discussed, pre-op evaluation,  At surgeon's request and post-op pain management  Laterality: Left  Prep: Maximum Sterile Barrier Precautions used and chloraprep       Needles:  Injection technique: Single-shot  Needle Type: Echogenic Stimulator Needle     Needle Length: 5cm 5 cm Needle Gauge: 22 and 22 G    Additional Needles:  Procedures: ultrasound guided (picture in chart) and nerve stimulator Interscalene brachial plexus block  Nerve Stimulator or Paresthesia:  Response: Biceps response,   Additional Responses:   Narrative:  Start time: 12/12/2012 12:26 PM End time: 12/12/2012 12:37 PM Injection made incrementally with aspirations every 5 mL. Anesthesiologist: Sampson Goon, MD  Additional Notes: 2% Lidocaine skin wheel.   Interscalene brachial plexus block Procedure Name: Intubation Date/Time: 12/12/2012 1:41 PM Performed by: Gar Gibbon Pre-anesthesia Checklist: Patient identified, Emergency Drugs available, Suction available and Patient being monitored Patient Re-evaluated:Patient Re-evaluated prior to inductionOxygen Delivery Method: Circle System Utilized Preoxygenation: Pre-oxygenation with 100% oxygen Intubation Type: IV induction Ventilation: Mask ventilation without difficulty Laryngoscope Size: Miller and 3 Grade View: Grade II Tube type: Oral Tube size: 8.0 mm Number of attempts: 4 Airway Equipment and Method: stylet,  oral airway and Video-laryngoscopy Placement Confirmation: positive ETCO2 and breath sounds checked- equal and bilateral Secured at: 23 cm Tube secured with: Tape Dental Injury: Bloody posterior oropharynx  Difficulty Due To: Difficulty was unanticipated and Difficult Airway- due to anterior  larynx Future Recommendations: Recommend- induction with short-acting agent, and alternative techniques readily available

## 2012-12-12 NOTE — Progress Notes (Signed)
Assisted Dr. Fitzgerald with left, ultrasound guided, supraclavicular block. Side rails up, monitors on throughout procedure. See vital signs in flow sheet. Tolerated Procedure well. 

## 2012-12-12 NOTE — Transfer of Care (Signed)
Immediate Anesthesia Transfer of Care Note  Patient: Stephen Arnold  Procedure(s) Performed: Procedure(s): SHOULDER ARTHROSCOPY WITH SUBACROMIAL DECOMPRESSION AND DISTAL CLAVICLE EXCISION (Left)  Patient Location: PACU  Anesthesia Type:GA combined with regional for post-op pain  Level of Consciousness: awake, sedated and patient cooperative  Airway & Oxygen Therapy: Patient Spontanous Breathing and Patient connected to face mask oxygen  Post-op Assessment: Report given to PACU RN and Post -op Vital signs reviewed and stable  Post vital signs: Reviewed and stable  Complications: No apparent anesthesia complications

## 2012-12-12 NOTE — Anesthesia Postprocedure Evaluation (Signed)
Anesthesia Post Note  Patient: Stephen Arnold  Procedure(s) Performed: Procedure(s) (LRB): SHOULDER ARTHROSCOPY WITH SUBACROMIAL DECOMPRESSION AND DISTAL CLAVICLE EXCISION (Left)  Anesthesia type: general  Patient location: PACU  Post pain: Pain level controlled  Post assessment: Patient's Cardiovascular Status Stable  Last Vitals:  Filed Vitals:   12/12/12 1530  BP:   Pulse: 84  Temp:   Resp: 18    Post vital signs: Reviewed and stable  Level of consciousness: sedated  Complications: No apparent anesthesia complications

## 2012-12-12 NOTE — Anesthesia Preprocedure Evaluation (Signed)
Anesthesia Evaluation  Patient identified by MRN, date of birth, ID band Patient awake    Reviewed: Allergy & Precautions, H&P , NPO status , Patient's Chart, lab work & pertinent test results  Airway Mallampati: II TM Distance: >3 FB Neck ROM: Full    Dental no notable dental hx. (+) Teeth Intact and Dental Advisory Given   Pulmonary neg pulmonary ROS,  breath sounds clear to auscultation  Pulmonary exam normal       Cardiovascular negative cardio ROS  Rhythm:Regular Rate:Normal     Neuro/Psych negative neurological ROS  negative psych ROS   GI/Hepatic Neg liver ROS, GERD-  Medicated and Controlled,  Endo/Other  diabetes, Type 2, Oral Hypoglycemic Agents  Renal/GU negative Renal ROS  negative genitourinary   Musculoskeletal   Abdominal   Peds  Hematology negative hematology ROS (+)   Anesthesia Other Findings   Reproductive/Obstetrics negative OB ROS                           Anesthesia Physical Anesthesia Plan  ASA: II  Anesthesia Plan: General and Regional   Post-op Pain Management:    Induction: Intravenous  Airway Management Planned: Oral ETT  Additional Equipment:   Intra-op Plan:   Post-operative Plan: Extubation in OR  Informed Consent: I have reviewed the patients History and Physical, chart, labs and discussed the procedure including the risks, benefits and alternatives for the proposed anesthesia with the patient or authorized representative who has indicated his/her understanding and acceptance.   Dental advisory given  Plan Discussed with: CRNA  Anesthesia Plan Comments:         Anesthesia Quick Evaluation

## 2012-12-13 NOTE — Op Note (Signed)
NAMEKAMERIN, GRUMBINE NO.:  [id number]  MEDICAL RECORD NO.:  [id number]  LOCATION:                               FACILITY:  [facility]  PHYSICIAN:  Lubertha Basque. Savon Bordonaro, M.D.DATE OF BIRTH:  Sep 04, 1965  DATE OF PROCEDURE:  12/12/2012 DATE OF DISCHARGE:  12/12/2012                              OPERATIVE REPORT   PREOPERATIVE DIAGNOSES: 1. Left shoulder impingement. 2. Left shoulder acromioclavicular degeneration.  POSTOPERATIVE DIAGNOSES: 1. Left shoulder impingement. 2. Left shoulder acromioclavicular degeneration.  PROCEDURES: 1. Left shoulder arthroscopic debridement. 2. Left shoulder arthroscopic acromioclavicular resection. 3. Left shoulder arthroscopic acromioplasty.  ANESTHESIA:  General and block.  ATTENDING SURGEON:  Lubertha Basque. Jerl Santos, M.D.  ASSISTANT:  Lindwood Qua, P.A.  INDICATION FOR PROCEDURE:  The patient is a 47 year old male with long history of left shoulder pain.  This is persisted for years despite various oral anti-inflammatories, therapy, and three injections, all of which did afford him transient relief.  By MRI scan, he has cup irritation, but no complete tear.  He does have things consistent with impingement related to the shape of this acromion and the [facility] joint. With pain that limits his ability to rest and use his arm, he is offered an arthroscopy.  Informed operative consent was obtained after discussion of possible complications including reaction to anesthesia and infection.  SUMMARY OF FINDINGS AND PROCEDURE:  Under general anesthesia and a block, a left shoulder arthroscopy was performed.  The glenohumeral joint showed no degenerative changes in the biceps tendon and rotator cuff appeared benign from below.  In the subacromial space, he had irritation of the cuff, but no tear worthy of repair.  I performed an acromioplasty followed by a formal AC decompression removing about 1 cm of bone in this location.  He was discharged  to home the same day.  DESCRIPTION OF PROCEDURE:  The patient was taken to the operating suite where general anesthetic was applied without difficulty.  He was also given a block in the preanesthesia area.  He was positioned in a beach- chair position and prepped and draped in normal sterile fashion.  After the administration of IV Kefzol and an appropriate time-out, an arthroscopy of the left shoulder was performed through total of three portals.  Findings were as noted above and procedure consisted predominantly of the formal AC resection where I took about 1 cm to distal clavicle.  I also performed an acromioplasty with the bur in the lateral position followed by transfer of the bur to the posterior position.  The cuff was irritated and partially torn with thick bursa on the bursal aspect and I elected to debride this, but no tear worthy of repair was found.  The shoulder was thoroughly irrigated followed by reapproximation of portals loosely with nylon.  Adaptic was applied followed by dry gauze and tape.  Estimated blood loss and intraoperative fluids can be obtained from anesthesia records.  DISPOSITION:  The patient was extubated in the operating room and taken to the recovery room in stable addition.  He was to go home same-day and follow up in office next week.  I will contact him by phone tonight.     Lubertha Basque Jerl Santos, M.D.  PGD/MEDQ  D:  12/12/2012  T:  12/13/2012  Job:  [id number]

## 2013-10-02 ENCOUNTER — Other Ambulatory Visit (HOSPITAL_COMMUNITY): Payer: Self-pay | Admitting: Orthopaedic Surgery

## 2013-10-02 DIAGNOSIS — M75101 Unspecified rotator cuff tear or rupture of right shoulder, not specified as traumatic: Secondary | ICD-10-CM

## 2013-10-23 ENCOUNTER — Encounter (HOSPITAL_COMMUNITY): Payer: Self-pay | Admitting: Pharmacy Technician

## 2013-10-25 ENCOUNTER — Other Ambulatory Visit (HOSPITAL_COMMUNITY): Payer: Self-pay | Admitting: *Deleted

## 2013-10-25 ENCOUNTER — Encounter (HOSPITAL_COMMUNITY): Payer: Self-pay | Admitting: Vascular Surgery

## 2013-10-25 ENCOUNTER — Encounter (HOSPITAL_COMMUNITY): Payer: Self-pay

## 2013-10-25 ENCOUNTER — Encounter (HOSPITAL_COMMUNITY)
Admission: RE | Admit: 2013-10-25 | Discharge: 2013-10-25 | Disposition: A | Discharge status: Home / Self Care | Payer: 59 | Source: Ambulatory Visit | Attending: Orthopaedic Surgery | Admitting: Orthopaedic Surgery

## 2013-10-25 DIAGNOSIS — Z01812 Encounter for preprocedural laboratory examination: Secondary | ICD-10-CM | POA: Insufficient documentation

## 2013-10-25 LAB — CBC
HCT: 38.8 % — ABNORMAL LOW (ref 39.0–52.0)
HEMOGLOBIN: 12.9 g/dL — AB (ref 13.0–17.0)
MCH: 29.3 pg (ref 26.0–34.0)
MCHC: 33.2 g/dL (ref 30.0–36.0)
MCV: 88.2 fL (ref 78.0–100.0)
PLATELETS: 342 10*3/uL (ref 150–400)
RBC: 4.4 MIL/uL (ref 4.22–5.81)
RDW: 13 % (ref 11.5–15.5)
WBC: 7.3 10*3/uL (ref 4.0–10.5)

## 2013-10-25 LAB — BASIC METABOLIC PANEL
Anion gap: 15 (ref 5–15)
BUN: 17 mg/dL (ref 6–23)
CO2: 25 mEq/L (ref 19–32)
Calcium: 9.4 mg/dL (ref 8.4–10.5)
Chloride: 100 mEq/L (ref 96–112)
Creatinine, Ser: 0.65 mg/dL (ref 0.50–1.35)
GFR calc non Af Amer: >90 mL/min (ref >90)
GLUCOSE: 173 mg/dL — AB (ref 70–99)
POTASSIUM: 4.2 meq/L (ref 3.7–5.3)
SODIUM: 140 meq/L (ref 137–147)

## 2013-10-25 NOTE — Pre-Procedure Instructions (Signed)
Stephen Arnold  10/25/2013   Your procedure is scheduled on:  Tuesday, November 06, 2013 at 8:00 AM.   Report to [facility] Entrance "A" Admitting Office at 6:00 AM.   Call this number if you have problems the morning of surgery: [phone]   Remember:   Do not eat food or drink liquids after midnight Monday, 11/05/13.   Take these medicines the morning of surgery with A SIP OF WATER: loratadine (CLARITIN) - if needed.  Do not take diabetic medications the morning of procedure.    Do not wear jewelry.  Do not wear lotions, powders, or cologne. You may wear deodorant.  Men may shave face and neck.  Do not bring valuables to the hospital.  [facility] is not responsible                  for any belongings or valuables.               Contacts, dentures or bridgework may not be worn into surgery.  Leave suitcase in the car. After surgery it may be brought to your room.  For patients admitted to the hospital, discharge time is determined by your                treatment team.               Patients discharged the day of surgery will not be allowed to drive  home.  Name and phone number of your driver: Family/friend   Special Instructions: [place] - Preparing for Surgery  Before surgery, you can play an important role.  Because skin is not sterile, your skin needs to be as free of germs as possible.  You can reduce the number of germs on you skin by washing with CHG (chlorahexidine gluconate) soap before surgery.  CHG is an antiseptic cleaner which kills germs and bonds with the skin to continue killing germs even after washing.  Please DO NOT use if you have an allergy to CHG or antibacterial soaps.  If your skin becomes reddened/irritated stop using the CHG and inform your nurse when you arrive at Short Stay.  Do not shave (including legs and underarms) for at least 48 hours prior to the first CHG shower.  You may shave your face.  Please follow these instructions  carefully:   1.  Shower with CHG Soap the night before surgery and the                                morning of Surgery.  2.  If you choose to wash your hair, wash your hair first as usual with your       normal shampoo.  3.  After you shampoo, rinse your hair and body thoroughly to remove the                      Shampoo.  4.  Use CHG as you would any other liquid soap.  You can apply chg directly       to the skin and wash gently with scrungie or a clean washcloth.  5.  Apply the CHG Soap to your body ONLY FROM THE NECK DOWN.        Do not use on open wounds or open sores.  Avoid contact with your eyes, ears, mouth and genitals (private parts).  Wash genitals (private parts)  with your normal soap.  6.  Wash thoroughly, paying special attention to the area where your surgery        will be performed.  7.  Thoroughly rinse your body with warm water from the neck down.  8.  DO NOT shower/wash with your normal soap after using and rinsing off       the CHG Soap.  9.  Pat yourself dry with a clean towel.            10.  Wear clean pajamas.            11.  Place clean sheets on your bed the night of your first shower and do not        sleep with pets.  Day of Surgery  Do not apply any lotions the morning of surgery.  Please wear clean clothes to the hospital/surgery center.     Please read over the following fact sheets that you were given: Pain Booklet, Coughing and Deep Breathing and Surgical Site Infection Prevention

## 2013-11-06 ENCOUNTER — Ambulatory Visit (HOSPITAL_COMMUNITY): Admission: RE | Admit: 2013-11-06 | Payer: 59 | Source: Ambulatory Visit | Admitting: Orthopaedic Surgery

## 2013-11-06 ENCOUNTER — Ambulatory Visit (HOSPITAL_COMMUNITY)
Admission: RE | Admit: 2013-11-06 | Discharge: 2013-11-06 | Disposition: A | Discharge status: Home / Self Care | Payer: 59 | Source: Ambulatory Visit | Attending: Orthopaedic Surgery | Admitting: Orthopaedic Surgery

## 2013-11-06 ENCOUNTER — Ambulatory Visit (HOSPITAL_COMMUNITY): Admission: RE | Admit: 2013-11-06 | Payer: 59 | Source: Ambulatory Visit

## 2013-11-06 ENCOUNTER — Encounter (HOSPITAL_COMMUNITY): Admission: RE | Payer: Self-pay | Source: Ambulatory Visit

## 2013-11-06 DIAGNOSIS — M75101 Unspecified rotator cuff tear or rupture of right shoulder, not specified as traumatic: Secondary | ICD-10-CM

## 2013-11-06 SURGERY — RADIOLOGY WITH ANESTHESIA
Anesthesia: General | Laterality: Right

## 2013-11-10 ENCOUNTER — Ambulatory Visit (HOSPITAL_BASED_OUTPATIENT_CLINIC_OR_DEPARTMENT_OTHER): Admission: RE | Admit: 2013-11-10 | Payer: 59 | Source: Ambulatory Visit

## 2013-11-17 ENCOUNTER — Ambulatory Visit (HOSPITAL_BASED_OUTPATIENT_CLINIC_OR_DEPARTMENT_OTHER): Payer: 59

## 2013-12-28 ENCOUNTER — Other Ambulatory Visit (HOSPITAL_COMMUNITY): Payer: Self-pay | Admitting: Orthopaedic Surgery

## 2013-12-28 DIAGNOSIS — M25511 Pain in right shoulder: Secondary | ICD-10-CM

## 2014-01-14 ENCOUNTER — Ambulatory Visit (HOSPITAL_COMMUNITY): Payer: 59

## 2014-01-17 ENCOUNTER — Ambulatory Visit (HOSPITAL_COMMUNITY)
Admission: RE | Admit: 2014-01-17 | Discharge: 2014-01-17 | Disposition: A | Discharge status: Home / Self Care | Payer: 59 | Source: Ambulatory Visit | Attending: Orthopaedic Surgery | Admitting: Orthopaedic Surgery

## 2014-01-17 ENCOUNTER — Encounter: Payer: Self-pay | Admitting: Physician Assistant

## 2014-01-17 DIAGNOSIS — M25511 Pain in right shoulder: Secondary | ICD-10-CM

## 2014-01-21 ENCOUNTER — Encounter (HOSPITAL_COMMUNITY): Payer: Self-pay | Admitting: *Deleted

## 2014-01-21 ENCOUNTER — Encounter (HOSPITAL_COMMUNITY): Payer: Self-pay | Admitting: Pharmacy Technician

## 2014-01-22 ENCOUNTER — Ambulatory Visit (HOSPITAL_COMMUNITY)
Admission: RE | Admit: 2014-01-22 | Discharge: 2014-01-22 | Disposition: A | Discharge status: Home / Self Care | Payer: 59 | Source: Ambulatory Visit | Attending: Orthopaedic Surgery | Admitting: Orthopaedic Surgery

## 2014-01-22 ENCOUNTER — Encounter (HOSPITAL_COMMUNITY): Payer: Self-pay | Admitting: Anesthesiology

## 2014-01-22 ENCOUNTER — Encounter (HOSPITAL_COMMUNITY)
Admission: RE | Disposition: A | Discharge status: Home / Self Care | Payer: Self-pay | Source: Ambulatory Visit | Attending: Orthopaedic Surgery

## 2014-01-22 ENCOUNTER — Encounter (HOSPITAL_COMMUNITY): Payer: 59 | Admitting: Anesthesiology

## 2014-01-22 ENCOUNTER — Ambulatory Visit (HOSPITAL_COMMUNITY): Payer: 59 | Admitting: Anesthesiology

## 2014-01-22 DIAGNOSIS — M25519 Pain in unspecified shoulder: Secondary | ICD-10-CM | POA: Diagnosis not present

## 2014-01-22 HISTORY — DX: Unspecified osteoarthritis, unspecified site: M19.90

## 2014-01-22 HISTORY — DX: Concussion with loss of consciousness status unknown, initial encounter: S06.0XAA

## 2014-01-22 HISTORY — DX: Concussion with loss of consciousness of unspecified duration, initial encounter: S06.0X9A

## 2014-01-22 HISTORY — PX: RADIOLOGY WITH ANESTHESIA: SHX6223

## 2014-01-22 HISTORY — DX: Personal history of other medical treatment: Z92.89

## 2014-01-22 LAB — GLUCOSE, CAPILLARY
Glucose-Capillary: 179 mg/dL — ABNORMAL HIGH (ref 70–99)
Glucose-Capillary: 163 mg/dL — ABNORMAL HIGH (ref 70–99)

## 2014-01-22 SURGERY — RADIOLOGY WITH ANESTHESIA
Anesthesia: Monitor Anesthesia Care | Laterality: Right

## 2014-01-22 MED ORDER — FENTANYL CITRATE 0.05 MG/ML IJ SOLN: 25.0000 ug | INTRAMUSCULAR | Status: DC | PRN

## 2014-01-22 MED ORDER — PROMETHAZINE HCL 25 MG/ML IJ SOLN: 6.2500 mg | INTRAMUSCULAR | Status: DC | PRN

## 2014-01-22 MED ORDER — LACTATED RINGERS IV SOLN
INTRAVENOUS | Status: DC | PRN
Start: 2014-01-22 — End: 2014-01-22
  Administered 2014-01-22: 07:00:00 via INTRAVENOUS

## 2014-01-22 MED ORDER — MEPERIDINE HCL 25 MG/ML IJ SOLN: 6.2500 mg | INTRAMUSCULAR | Status: DC | PRN

## 2014-01-22 NOTE — Discharge Instructions (Signed)

## 2014-01-22 NOTE — Transfer of Care (Signed)
Immediate Anesthesia Transfer of Care Note  Patient: Stephen Arnold  Procedure(s) Performed: Procedure(s): RADIOLOGY WITH ANESTHESIA  MRI OF SHOULDER (Right)  Patient Location: PACU  Anesthesia Type:General  Level of Consciousness: awake, alert  and oriented  Airway & Oxygen Therapy: Patient Spontanous Breathing and Patient connected to nasal cannula oxygen  Post-op Assessment: Report given to PACU RN, Post -op Vital signs reviewed and stable and Patient moving all extremities X 4  Post vital signs: Reviewed and stable  Complications: No apparent anesthesia complications

## 2014-01-22 NOTE — Progress Notes (Signed)
Per Dr. Tresa Moore no labs or EKG needed.

## 2014-01-22 NOTE — Anesthesia Preprocedure Evaluation (Addendum)
Anesthesia Evaluation  Patient identified by MRN, date of birth, ID band Patient awake    Reviewed: Allergy & Precautions, H&P , NPO status   Airway Mallampati: II      Dental  (+) Teeth Intact   Pulmonary  breath sounds clear to auscultation        Cardiovascular Rhythm:Regular Rate:Normal     Neuro/Psych Anxiety    GI/Hepatic GERD-  Medicated,  Endo/Other  diabetes, Poorly Controlled, Type 2, Oral Hypoglycemic Agents  Renal/GU      Musculoskeletal   Abdominal (+) + obese,   Peds  Hematology   Anesthesia Other Findings   Reproductive/Obstetrics                       Anesthesia Physical Anesthesia Plan  ASA: III  Anesthesia Plan: MAC and General   Post-op Pain Management:    Induction: Intravenous  Airway Management Planned: LMA  Additional Equipment:   Intra-op Plan:   Post-operative Plan:   Informed Consent: I have reviewed the patients History and Physical, chart, labs and discussed the procedure including the risks, benefits and alternatives for the proposed anesthesia with the patient or authorized representative who has indicated his/her understanding and acceptance.     Plan Discussed with:   Anesthesia Plan Comments: (Pt needs to be totally unconscious.  Has failed MRI with sedation)      Anesthesia Quick Evaluation

## 2014-01-22 NOTE — Progress Notes (Signed)
Pt requested belongings with valuables be locked up in MRI locker spoke to MRI advised that they could accommodate request patient notified

## 2014-01-22 NOTE — Anesthesia Postprocedure Evaluation (Signed)
  Anesthesia Post-op Note  Patient: Stephen Arnold  Procedure(s) Performed: Procedure(s): RADIOLOGY WITH ANESTHESIA  MRI OF SHOULDER (Right)  Patient Location: PACU  Anesthesia Type:General  Level of Consciousness: awake and alert   Airway and Oxygen Therapy: Patient Spontanous Breathing and Patient connected to nasal cannula oxygen  Post-op Pain: none  Post-op Assessment: Post-op Vital signs reviewed, Patient's Cardiovascular Status Stable, Respiratory Function Stable and Patent Airway  Post-op Vital Signs: Reviewed and stable  Last Vitals:  Filed Vitals:   01/22/14 0900  BP: 131/87  Pulse: 86  Temp: 36.7 C  Resp: 12    Complications: No apparent anesthesia complications

## 2014-01-24 ENCOUNTER — Encounter (HOSPITAL_COMMUNITY): Payer: Self-pay | Admitting: Radiology

## 2014-10-29 ENCOUNTER — Other Ambulatory Visit: Payer: Self-pay | Admitting: Orthopaedic Surgery

## 2014-10-29 DIAGNOSIS — M25551 Pain in right hip: Secondary | ICD-10-CM

## 2014-11-14 ENCOUNTER — Ambulatory Visit (HOSPITAL_COMMUNITY): Admission: RE | Admit: 2014-11-14 | Payer: 59 | Source: Ambulatory Visit

## 2014-12-03 ENCOUNTER — Ambulatory Visit: Admit: 2014-12-03 | Payer: Self-pay

## 2014-12-03 ENCOUNTER — Inpatient Hospital Stay (HOSPITAL_COMMUNITY): Admission: RE | Admit: 2014-12-03 | Payer: 59 | Source: Ambulatory Visit

## 2014-12-03 SURGERY — RADIOLOGY WITH ANESTHESIA
Anesthesia: General | Laterality: Right

## 2014-12-10 ENCOUNTER — Ambulatory Visit (HOSPITAL_COMMUNITY): Payer: 59

## 2014-12-10 NOTE — Pre-Procedure Instructions (Deleted)
Daven Montz Dante  12/10/2014      [facility] - [place],  - [address] [place] [address] Phone: [phone] Fax: [phone]    Your procedure is scheduled on December 12, 2014, Thursday.   Report to [facility] Admitting at 6:00 AM.                                                                                                                                                                                                                 Call this number if you have problems the morning of surgery:  [phone]   Remember:  Do not eat food or drink liquids after midnight Wednesday.  Take these medicines the morning of surgery with A SIP OF WATER : Nothing.              You will NOT take any diabetic medication the morning of surgery.   Do not wear jewelry - no rings or watches.  Do not wear lotions or colognes.  You may NOT wear deodorant the morning of surgery.             Men may shave face and neck.   Do not bring valuables to the hospital.  [facility] is not responsible for any belongings or valuables.  Contacts, dentures or bridgework may not be worn into surgery.  Leave your suitcase in the car.  After surgery it may be brought to your room.  Patients discharged the day of surgery will not be allowed to drive home.   Name and phone number of your driver:     Please read over the following fact sheets that you were given. Pain Booklet

## 2014-12-11 ENCOUNTER — Inpatient Hospital Stay (HOSPITAL_COMMUNITY)
Admission: RE | Admit: 2014-12-11 | Discharge: 2014-12-11 | Disposition: A | Discharge status: Home / Self Care | Payer: 59 | Source: Ambulatory Visit

## 2014-12-11 ENCOUNTER — Encounter (HOSPITAL_COMMUNITY): Payer: Self-pay

## 2014-12-11 NOTE — Progress Notes (Addendum)
Patient was running late on getting to PAT appt.  So, I have called him, got his HH. He will bring a copy of his labs that he had drawn a little less then 2 weeks ago.  Have instructed him to be here 6:45-7:00 AM for 9:00 MRI.  No orders.  DA

## 2014-12-12 ENCOUNTER — Encounter (HOSPITAL_COMMUNITY)
Admission: RE | Disposition: A | Discharge status: Home / Self Care | Payer: Self-pay | Source: Ambulatory Visit | Attending: Orthopaedic Surgery

## 2014-12-12 ENCOUNTER — Ambulatory Visit (HOSPITAL_COMMUNITY): Payer: 59 | Admitting: Anesthesiology

## 2014-12-12 ENCOUNTER — Encounter (HOSPITAL_COMMUNITY): Payer: Self-pay | Admitting: Anesthesiology

## 2014-12-12 ENCOUNTER — Ambulatory Visit (HOSPITAL_COMMUNITY)
Admission: RE | Admit: 2014-12-12 | Discharge: 2014-12-12 | Disposition: A | Discharge status: Home / Self Care | Payer: 59 | Source: Ambulatory Visit | Attending: Orthopaedic Surgery | Admitting: Orthopaedic Surgery

## 2014-12-12 DIAGNOSIS — M25551 Pain in right hip: Secondary | ICD-10-CM | POA: Insufficient documentation

## 2014-12-12 DIAGNOSIS — W19XXXA Unspecified fall, initial encounter: Secondary | ICD-10-CM | POA: Insufficient documentation

## 2014-12-12 HISTORY — PX: RADIOLOGY WITH ANESTHESIA: SHX6223

## 2014-12-12 LAB — GLUCOSE, CAPILLARY
GLUCOSE-CAPILLARY: 217 mg/dL — AB (ref 65–99)
Glucose-Capillary: 250 mg/dL — ABNORMAL HIGH (ref 65–99)

## 2014-12-12 SURGERY — RADIOLOGY WITH ANESTHESIA
Anesthesia: General

## 2014-12-12 MED ORDER — FENTANYL CITRATE (PF) 100 MCG/2ML IJ SOLN
INTRAMUSCULAR | Status: DC | PRN
Start: 2014-12-12 — End: 2014-12-12
  Administered 2014-12-12: 50 ug via INTRAVENOUS

## 2014-12-12 MED ORDER — PROPOFOL 10 MG/ML IV BOLUS
INTRAVENOUS | Status: DC | PRN
  Administered 2014-12-12: 200 mg via INTRAVENOUS

## 2014-12-12 MED ORDER — MIDAZOLAM HCL 5 MG/5ML IJ SOLN
INTRAMUSCULAR | Status: DC | PRN
  Administered 2014-12-12 (×2): 1 mg via INTRAVENOUS

## 2014-12-12 MED ORDER — LACTATED RINGERS IV SOLN
INTRAVENOUS | Status: DC | PRN
  Administered 2014-12-12: 09:00:00 via INTRAVENOUS

## 2014-12-12 NOTE — Discharge Instructions (Addendum)
General Anesthesia °General anesthesia is a sleep-like state of non-feeling produced by medicines (anesthetics). General anesthesia prevents you from being alert and feeling pain during a medical procedure. Your caregiver may recommend general anesthesia if your procedure: °· Is long. °· Is painful or uncomfortable. °· Would be frightening to see or hear. °· Requires you to be still. °· Affects your breathing. °· Causes significant blood loss. °LET YOUR CAREGIVER KNOW ABOUT: °· Allergies to food or medicine. °· Medicines taken, including vitamins, herbs, eyedrops, over-the-counter medicines, and creams. °· Use of steroids (by mouth or creams). °· Previous problems with anesthetics or numbing medicines, including problems experienced by relatives. °· History of bleeding problems or blood clots. °· Previous surgeries and types of anesthetics received. °· Possibility of pregnancy, if this applies. °· Use of cigarettes, alcohol, or illegal drugs. °· Any health condition(s), especially diabetes, sleep apnea, and high blood pressure. °RISKS AND COMPLICATIONS °General anesthesia rarely causes complications. However, if complications do occur, they can be life threatening. Complications include: °· A lung infection. °· A stroke. °· A heart attack. °· Waking up during the procedure. When this occurs, the patient may be unable to move and communicate that he or she is awake. The patient may feel severe pain. °Older adults and adults with serious medical problems are more likely to have complications than adults who are young and healthy. Some complications can be prevented by answering all of your caregiver's questions thoroughly and by following all pre-procedure instructions. It is important to tell your caregiver if any of the pre-procedure instructions, especially those related to diet, were not followed. Any food or liquid in the stomach can cause problems when you are under general anesthesia. °BEFORE THE  PROCEDURE °· Ask your caregiver if you will have to spend the night at the hospital. If you will not have to spend the night, arrange to have an adult drive you and stay with you for 24 hours. °· Follow your caregiver's instructions if you are taking dietary supplements or medicines. Your caregiver may tell you to stop taking them or to reduce your dosage. °· Do not smoke for as long as possible before your procedure. If possible, stop smoking 3-6 weeks before the procedure. °· Do not take new dietary supplements or medicines within 1 week of your procedure unless your caregiver approves them. °· Do not eat within 8 hours of your procedure or as directed by your caregiver. Drink only clear liquids, such as water, black coffee (without milk or cream), and fruit juices (without pulp). °· Do not drink within 3 hours of your procedure or as directed by your caregiver. °· You may brush your teeth on the morning of the procedure, but make sure to spit out the toothpaste and water when finished. °PROCEDURE  °You will receive anesthetics through a mask, through an intravenous (IV) access tube, or through both. A doctor who specializes in anesthesia (anesthesiologist) or a nurse who specializes in anesthesia (nurse anesthetist) or both will stay with you throughout the procedure to make sure you remain unconscious. He or she will also watch your blood pressure, pulse, and oxygen levels to make sure that the anesthetics do not cause any problems. Once you are asleep, a breathing tube or mask may be used to help you breathe. °AFTER THE PROCEDURE °You will wake up after the procedure is complete. You may be in the room where the procedure was performed or in a recovery area. You may have a sore throat if   a breathing tube was used. You may also feel: °· Dizzy. °· Weak. °· Drowsy. °· Confused. °· Nauseous. °· Cold. °These are all normal responses and can be expected to last for up to 24 hours after the procedure is complete. A  caregiver will tell you when you are ready to go home. This will usually be when you are fully awake and in stable condition. °Document Released: 07/13/2007 Document Revised: 08/20/2013 Document Reviewed: 08/04/2011 °Exit[employer]® Patient Information ©2015 [employer]. This information is not intended to replace advice given to you by your health care provider. Make sure you discuss any questions you have with your health care provider. ° °

## 2014-12-12 NOTE — Anesthesia Procedure Notes (Signed)
Procedure Name: LMA Insertion Date/Time: 12/12/2014 8:26 AM Performed by: Scheryl Darter Pre-anesthesia Checklist: Patient identified, Emergency Drugs available, Suction available, Patient being monitored and Timeout performed Patient Re-evaluated:Patient Re-evaluated prior to inductionOxygen Delivery Method: Circle system utilized Preoxygenation: Pre-oxygenation with 100% oxygen Intubation Type: IV induction LMA: LMA inserted LMA Size: 5.0 Number of attempts: 1 Placement Confirmation: positive ETCO2 and breath sounds checked- equal and bilateral

## 2014-12-12 NOTE — Anesthesia Preprocedure Evaluation (Addendum)
Anesthesia Evaluation  Patient identified by MRN, date of birth, ID band Patient awake    Reviewed: Allergy & Precautions, H&P , NPO status , Patient's Chart, lab work & pertinent test results  Airway Mallampati: II  TM Distance: >3 FB Neck ROM: Full    Dental no notable dental hx. (+) Teeth Intact, Dental Advisory Given   Pulmonary neg pulmonary ROS,  breath sounds clear to auscultation  Pulmonary exam normal       Cardiovascular negative cardio ROS  Rhythm:Regular Rate:Normal     Neuro/Psych  Headaches, Anxiety negative psych ROS   GI/Hepatic negative GI ROS, Neg liver ROS,   Endo/Other  diabetes, Type 2, Oral Hypoglycemic Agents  Renal/GU negative Renal ROS  negative genitourinary   Musculoskeletal  (+) Arthritis -, Osteoarthritis,    Abdominal   Peds  Hematology negative hematology ROS (+)   Anesthesia Other Findings   Reproductive/Obstetrics negative OB ROS                            Anesthesia Physical Anesthesia Plan  ASA: II  Anesthesia Plan: General   Post-op Pain Management:    Induction: Intravenous  Airway Management Planned: LMA  Additional Equipment:   Intra-op Plan:   Post-operative Plan: Extubation in OR  Informed Consent: I have reviewed the patients History and Physical, chart, labs and discussed the procedure including the risks, benefits and alternatives for the proposed anesthesia with the patient or authorized representative who has indicated his/her understanding and acceptance.   Dental advisory given  Plan Discussed with: CRNA and Surgeon  Anesthesia Plan Comments:         Anesthesia Quick Evaluation

## 2014-12-12 NOTE — Anesthesia Postprocedure Evaluation (Signed)
  Anesthesia Post-op Note  Patient: Stephen Arnold  Procedure(s) Performed: Procedure(s): MRI (N/A)  Patient Location: PACU  Anesthesia Type:General  Level of Consciousness: awake and alert   Airway and Oxygen Therapy: Patient Spontanous Breathing  Post-op Pain: Controlled  Post-op Assessment: Post-op Vital signs reviewed, Patient's Cardiovascular Status Stable and Respiratory Function Stable  Post-op Vital Signs: Reviewed  Filed Vitals:   12/12/14 0941  BP:   Pulse: 78  Temp:   Resp: 14    Complications: No apparent anesthesia complications

## 2014-12-12 NOTE — Transfer of Care (Signed)
Immediate Anesthesia Transfer of Care Note  Patient: Stephen Arnold  Procedure(s) Performed: Procedure(s): MRI (N/A)  Patient Location: PACU  Anesthesia Type:General  Level of Consciousness: awake, alert , oriented and sedated  Airway & Oxygen Therapy: Patient Spontanous Breathing and Patient connected to nasal cannula oxygen  Post-op Assessment: Report given to RN, Post -op Vital signs reviewed and stable and Patient moving all extremities  Post vital signs: Reviewed and stable  Last Vitals:  Filed Vitals:   12/12/14 0916  BP: 102/82  Pulse: 80  Temp: 36.6 C  Resp: 9    Complications: No apparent anesthesia complications

## 2014-12-13 ENCOUNTER — Encounter (HOSPITAL_COMMUNITY): Payer: Self-pay | Admitting: Radiology

## 2014-12-18 MED FILL — Propofol IV Emul 200 MG/20ML (10 MG/ML): INTRAVENOUS | Qty: 20 | Status: AC

## 2014-12-18 MED FILL — Fentanyl Citrate Preservative Free (PF) Inj 100 MCG/2ML: INTRAMUSCULAR | Qty: 2 | Status: AC

## 2014-12-18 MED FILL — Midazolam HCl Inj 2 MG/2ML (Base Equivalent): INTRAMUSCULAR | Qty: 2 | Status: AC

## 2015-04-22 MED FILL — TOUJEO SOLOSTAR 300 UNITS/M: 300 | 67 days supply | Qty: 5 | Fill #0

## 2015-04-22 MED FILL — UNIFINE PENTIPS 32GX5/32: 32G X 4 MM | 90 days supply | Qty: 100 | Fill #0

## 2015-05-12 DIAGNOSIS — E1165 Type 2 diabetes mellitus with hyperglycemia: Secondary | ICD-10-CM | POA: Diagnosis not present

## 2015-05-12 MED FILL — METFORMIN HCL ER 500 MG TAB: 500 | 90 days supply | Qty: 360 | Fill #0

## 2015-06-09 MED FILL — GLIPIZIDE-METFORMIN 5-500 M: 5-500 | 90 days supply | Qty: 360 | Fill #0

## 2015-07-03 MED FILL — TOUJEO SOLOSTAR 300 UNITS/M: 300 | 67 days supply | Qty: 5 | Fill #1

## 2015-07-29 MED FILL — UNIFINE PENTIPS 32GX5/32: 32G X 4 MM | 90 days supply | Qty: 100 | Fill #1

## 2015-08-25 DIAGNOSIS — E1165 Type 2 diabetes mellitus with hyperglycemia: Secondary | ICD-10-CM | POA: Diagnosis not present

## 2015-08-25 MED FILL — TOUJEO SOLOSTAR 300 UNITS/M: 300 | 45 days supply | Qty: 5 | Fill #0

## 2015-09-10 MED FILL — GLIPIZIDE-METFORMIN 5-500 M: 5-500 | 30 days supply | Qty: 120 | Fill #1

## 2015-11-03 MED FILL — TOUJEO SOLOSTAR 300 UNITS/M: 300 | 45 days supply | Qty: 5 | Fill #1

## 2015-11-03 MED FILL — UNIFINE PENTIPS 32GX5/32: 32G X 4 MM | 50 days supply | Qty: 100 | Fill #2

## 2015-11-11 DIAGNOSIS — E1165 Type 2 diabetes mellitus with hyperglycemia: Secondary | ICD-10-CM | POA: Diagnosis not present

## 2015-11-24 DIAGNOSIS — E1165 Type 2 diabetes mellitus with hyperglycemia: Secondary | ICD-10-CM | POA: Diagnosis not present

## 2015-12-01 DIAGNOSIS — E1165 Type 2 diabetes mellitus with hyperglycemia: Secondary | ICD-10-CM | POA: Diagnosis not present

## 2015-12-10 DIAGNOSIS — H5203 Hypermetropia, bilateral: Secondary | ICD-10-CM | POA: Diagnosis not present

## 2015-12-10 DIAGNOSIS — H524 Presbyopia: Secondary | ICD-10-CM | POA: Diagnosis not present

## 2015-12-10 DIAGNOSIS — H52223 Regular astigmatism, bilateral: Secondary | ICD-10-CM | POA: Diagnosis not present

## 2015-12-18 DIAGNOSIS — E1165 Type 2 diabetes mellitus with hyperglycemia: Secondary | ICD-10-CM | POA: Diagnosis not present

## 2015-12-19 MED FILL — UNIFINE PENTIPS 32GX5/32: 32G X 4 MM | 50 days supply | Qty: 100 | Fill #3

## 2015-12-19 MED FILL — TOUJEO SOLOSTAR 300 UNITS/M: 300 | 90 days supply | Qty: 18 | Fill #0

## 2015-12-19 MED FILL — HUMALOG 100 UNITS/ML KWIKPE: 100 | 90 days supply | Qty: 15 | Fill #0

## 2015-12-19 MED FILL — UNIFINE PENTIPS 32GX5/32": 32G X 4 MM | 50 days supply | Qty: 100 | Fill #3

## 2016-01-21 DIAGNOSIS — M25562 Pain in left knee: Secondary | ICD-10-CM | POA: Diagnosis not present

## 2016-02-04 DIAGNOSIS — M25562 Pain in left knee: Secondary | ICD-10-CM | POA: Diagnosis not present

## 2016-02-04 MED FILL — HYDROCODON-APAP 5-325: 5-325 | 10 days supply | Qty: 30 | Fill #0

## 2016-02-06 MED FILL — UNIFINE PENTIPS 32GX5/32": 32G X 4 MM | 90 days supply | Qty: 100 | Fill #0

## 2016-02-06 MED FILL — UNIFINE PENTIPS 32GX5/32: 32G X 4 MM | 90 days supply | Qty: 100 | Fill #0

## 2016-02-18 DIAGNOSIS — M25562 Pain in left knee: Secondary | ICD-10-CM | POA: Diagnosis not present

## 2016-02-19 DIAGNOSIS — Z23 Encounter for immunization: Secondary | ICD-10-CM | POA: Diagnosis not present

## 2016-02-19 DIAGNOSIS — E1165 Type 2 diabetes mellitus with hyperglycemia: Secondary | ICD-10-CM | POA: Diagnosis not present

## 2016-02-19 MED FILL — HUMALOG 100 UNITS/ML KWIKPE: 100 | 50 days supply | Qty: 30 | Fill #0

## 2016-02-19 MED FILL — UNIFINE PENTIPS 32GX5/32: 32G X 4 MM | 28 days supply | Qty: 200 | Fill #0

## 2016-02-19 MED FILL — UNIFINE PENTIPS 32GX5/32": 32G X 4 MM | 28 days supply | Qty: 200 | Fill #0

## 2016-02-20 MED FILL — HYDROCODON-APAP 5-325: 5-325 | 7 days supply | Qty: 30 | Fill #0

## 2016-02-27 ENCOUNTER — Other Ambulatory Visit: Payer: Self-pay | Admitting: Orthopaedic Surgery

## 2016-03-01 ENCOUNTER — Encounter (HOSPITAL_COMMUNITY): Payer: Self-pay

## 2016-03-01 ENCOUNTER — Encounter (HOSPITAL_COMMUNITY)
Admission: RE | Admit: 2016-03-01 | Discharge: 2016-03-01 | Disposition: A | Discharge status: Home / Self Care | Payer: 59 | Source: Ambulatory Visit | Attending: Orthopaedic Surgery | Admitting: Orthopaedic Surgery

## 2016-03-01 DIAGNOSIS — M2342 Loose body in knee, left knee: Secondary | ICD-10-CM | POA: Diagnosis not present

## 2016-03-01 DIAGNOSIS — E119 Type 2 diabetes mellitus without complications: Secondary | ICD-10-CM | POA: Diagnosis not present

## 2016-03-01 DIAGNOSIS — M25562 Pain in left knee: Secondary | ICD-10-CM | POA: Diagnosis not present

## 2016-03-01 DIAGNOSIS — M23222 Derangement of posterior horn of medial meniscus due to old tear or injury, left knee: Secondary | ICD-10-CM | POA: Diagnosis not present

## 2016-03-01 DIAGNOSIS — M94262 Chondromalacia, left knee: Secondary | ICD-10-CM | POA: Diagnosis not present

## 2016-03-01 DIAGNOSIS — M1990 Unspecified osteoarthritis, unspecified site: Secondary | ICD-10-CM | POA: Diagnosis not present

## 2016-03-01 LAB — BASIC METABOLIC PANEL
ANION GAP: 8 (ref 5–15)
BUN: 15 mg/dL (ref 6–20)
CALCIUM: 9.1 mg/dL (ref 8.9–10.3)
CO2: 27 mmol/L (ref 22–32)
CREATININE: 0.73 mg/dL (ref 0.61–1.24)
Chloride: 101 mmol/L (ref 101–111)
Glucose, Bld: 364 mg/dL — ABNORMAL HIGH (ref 65–99)
Potassium: 4.3 mmol/L (ref 3.5–5.1)
SODIUM: 136 mmol/L (ref 135–145)

## 2016-03-01 LAB — CBC
HCT: 36.6 % — ABNORMAL LOW (ref 39.0–52.0)
Hemoglobin: 12.3 g/dL — ABNORMAL LOW (ref 13.0–17.0)
MCH: 27.8 pg (ref 26.0–34.0)
MCHC: 33.6 g/dL (ref 30.0–36.0)
MCV: 82.8 fL (ref 78.0–100.0)
Platelets: 328 10*3/uL (ref 150–400)
RBC: 4.42 MIL/uL (ref 4.22–5.81)
RDW: 12.6 % (ref 11.5–15.5)
WBC: 8.7 10*3/uL (ref 4.0–10.5)

## 2016-03-01 NOTE — Pre-Procedure Instructions (Addendum)
Jahmez Stenger [place]  03/01/2016      [place], [place] - [address] [facility]. [address] [place] [address] Phone: [phone] Fax: [phone]    Your procedure is scheduled on   Tuesday  03/02/16  Report to [facility] Admitting at 100 P.M.  Call this number if you have problems the morning of surgery:  [phone]   Remember:  Do not eat food or drink liquids after midnight.  Take these medicines the morning of surgery with A SIP OF WATER   TYLENOL 3      (STOP IBUPROFEN/ ADVIL/ MOTRIN/ ALEVE, GOODY POWDERS/ BC'S, HERBAL MEDICINES)    How to Manage Your Diabetes Before and After Surgery  Why is it important to control my blood sugar before and after surgery? . Improving blood sugar levels before and after surgery helps healing and can limit problems. . A way of improving blood sugar control is eating a healthy diet by: o  Eating less sugar and carbohydrates o  Increasing activity/exercise o  Talking with your doctor about reaching your blood sugar goals . High blood sugars (greater than 180 mg/dL) can raise your risk of infections and slow your recovery, so you will need to focus on controlling your diabetes during the weeks before surgery. . Make sure that the doctor who takes care of your diabetes knows about your planned surgery including the date and location.  How do I manage my blood sugar before surgery? . Check your blood sugar at least 4 times a day, starting 2 days before surgery, to make sure that the level is not too high or low. o Check your blood sugar the morning of your surgery when you wake up and every 2 hours until you get to the Short Stay unit. . If your blood sugar is less than 70 mg/dL, you will need to treat for low blood sugar: o Do not take insulin. o Treat a low blood sugar (less than 70 mg/dL) with  cup of clear juice (cranberry or apple), 4 glucose tablets, OR glucose  gel. o Recheck blood sugar in 15 minutes after treatment (to make sure it is greater than 70 mg/dL). If your blood sugar is not greater than 70 mg/dL on recheck, call [phone] for further instructions. . Report your blood sugar to the short stay nurse when you get to Short Stay.  . If you are admitted to the hospital after surgery: o Your blood sugar will be checked by the staff and you will probably be given insulin after surgery (instead of oral diabetes medicines) to make sure you have good blood sugar levels. o The goal for blood sugar control after surgery is 80-180 mg/dL.              WHAT DO I DO ABOUT MY DIABETES MEDICATION?   Marland Kitchen Do not take oral diabetes medicines (pills) the morning of surgery.  . THE NIGHT BEFORE SURGERY, take ___27_______ units of _____TOUJEO_____insulin.       Marland Kitchen HE MORNING OF SURGERY, take ________0_____ units of ______0____insulin.  . The day of surgery, do not take other diabetes injectables, including Byetta (exenatide), Bydureon (exenatide ER), Victoza (liraglutide), or Trulicity (dulaglutide).  . If your CBG is greater than 220 mg/dL, you may take  of your sliding scale (correction) dose of insulin.  Other Instructions:          Patient Signature:  Date:   Nurse Signature:  Date:   Reviewed and Endorsed by [facility] Patient Education Committee, August 2015  Do not wear jewelry, make-up or nail polish.  Do not wear lotions, powders, or perfumes, or deoderant.  Do not shave 48 hours prior to surgery.  Men may shave face and neck.  Do not bring valuables to the hospital.  [facility] is not responsible for any belongings or valuables.  Contacts, dentures or bridgework may not be worn into surgery.  Leave your suitcase in the car.  After surgery it may be brought to your room.  For patients admitted to the hospital, discharge time will be determined by your treatment team.  Patients discharged the day of surgery will not be  allowed to drive home.   Name and phone number of your driver:    Special instructions:  SEE PREPARING FOR SURGERY   Please read over the following fact sheets that you were given. MRSA Information and Surgical Site Infection Prevention
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
## 2016-03-02 ENCOUNTER — Encounter (HOSPITAL_COMMUNITY)
Admission: RE | Disposition: A | Discharge status: Home / Self Care | Payer: Self-pay | Source: Ambulatory Visit | Attending: Orthopaedic Surgery

## 2016-03-02 ENCOUNTER — Ambulatory Visit (HOSPITAL_BASED_OUTPATIENT_CLINIC_OR_DEPARTMENT_OTHER)
Admission: RE | Admit: 2016-03-02 | Discharge: 2016-03-02 | Disposition: A | Discharge status: Home / Self Care | Payer: 59 | Source: Ambulatory Visit | Attending: Orthopaedic Surgery | Admitting: Orthopaedic Surgery

## 2016-03-02 ENCOUNTER — Ambulatory Visit (HOSPITAL_COMMUNITY): Payer: 59 | Admitting: Anesthesiology

## 2016-03-02 ENCOUNTER — Ambulatory Visit (HOSPITAL_COMMUNITY): Payer: 59 | Admitting: Vascular Surgery

## 2016-03-02 DIAGNOSIS — M25562 Pain in left knee: Secondary | ICD-10-CM | POA: Diagnosis present

## 2016-03-02 DIAGNOSIS — M23322 Other meniscus derangements, posterior horn of medial meniscus, left knee: Secondary | ICD-10-CM | POA: Diagnosis not present

## 2016-03-02 DIAGNOSIS — E119 Type 2 diabetes mellitus without complications: Secondary | ICD-10-CM | POA: Diagnosis not present

## 2016-03-02 DIAGNOSIS — M94262 Chondromalacia, left knee: Secondary | ICD-10-CM | POA: Insufficient documentation

## 2016-03-02 DIAGNOSIS — M1990 Unspecified osteoarthritis, unspecified site: Secondary | ICD-10-CM | POA: Diagnosis not present

## 2016-03-02 DIAGNOSIS — M2342 Loose body in knee, left knee: Secondary | ICD-10-CM | POA: Diagnosis not present

## 2016-03-02 DIAGNOSIS — S83242A Other tear of medial meniscus, current injury, left knee, initial encounter: Secondary | ICD-10-CM | POA: Diagnosis not present

## 2016-03-02 DIAGNOSIS — M7702 Medial epicondylitis, left elbow: Secondary | ICD-10-CM | POA: Diagnosis not present

## 2016-03-02 DIAGNOSIS — M23222 Derangement of posterior horn of medial meniscus due to old tear or injury, left knee: Secondary | ICD-10-CM | POA: Insufficient documentation

## 2016-03-02 HISTORY — PX: KNEE ARTHROSCOPY: SHX127

## 2016-03-02 LAB — HEMOGLOBIN A1C
Hgb A1c MFr Bld: 11.2 % — ABNORMAL HIGH (ref 4.8–5.6)
MEAN PLASMA GLUCOSE: 275 mg/dL

## 2016-03-02 LAB — GLUCOSE, CAPILLARY
GLUCOSE-CAPILLARY: 190 mg/dL — AB (ref 65–99)
Glucose-Capillary: 336 mg/dL — ABNORMAL HIGH (ref 65–99)
Glucose-Capillary: 150 mg/dL — ABNORMAL HIGH (ref 65–99)
Glucose-Capillary: 136 mg/dL — ABNORMAL HIGH (ref 65–99)

## 2016-03-02 SURGERY — ARTHROSCOPY, KNEE
Anesthesia: General | Site: Knee | Laterality: Left

## 2016-03-02 MED ORDER — MIDAZOLAM HCL 2 MG/2ML IJ SOLN
INTRAMUSCULAR | Status: AC
  Filled 2016-03-02: qty 2

## 2016-03-02 MED ORDER — OXYCODONE HCL 5 MG/5ML PO SOLN: 5.0000 mg | Freq: Once | ORAL | Status: DC | PRN

## 2016-03-02 MED ORDER — BUPIVACAINE HCL (PF) 0.5 % IJ SOLN
INTRAMUSCULAR | Status: AC
  Filled 2016-03-02: qty 30

## 2016-03-02 MED ORDER — FENTANYL CITRATE (PF) 100 MCG/2ML IJ SOLN
INTRAMUSCULAR | Status: DC
Start: 2016-03-02 — End: 2016-03-02
  Filled 2016-03-02: qty 2

## 2016-03-02 MED ORDER — PROPOFOL 10 MG/ML IV BOLUS
INTRAVENOUS | Status: AC
  Filled 2016-03-02: qty 20

## 2016-03-02 MED ORDER — ONDANSETRON HCL 4 MG/2ML IJ SOLN
INTRAMUSCULAR | Status: DC | PRN
  Administered 2016-03-02: 4 mg via INTRAVENOUS

## 2016-03-02 MED ORDER — LACTATED RINGERS IV SOLN
INTRAVENOUS | Status: DC
  Administered 2016-03-02 (×2): via INTRAVENOUS

## 2016-03-02 MED ORDER — OXYCODONE HCL 5 MG PO TABS: 5.0000 mg | ORAL_TABLET | Freq: Once | ORAL | Status: DC | PRN

## 2016-03-02 MED ORDER — ONDANSETRON HCL 4 MG/2ML IJ SOLN: 4.0000 mg | Freq: Once | INTRAMUSCULAR | Status: DC | PRN

## 2016-03-02 MED ORDER — METHYLPREDNISOLONE ACETATE 80 MG/ML IJ SUSP
INTRAMUSCULAR | Status: AC
  Filled 2016-03-02: qty 1

## 2016-03-02 MED ORDER — OXYCODONE HCL 5 MG/5ML PO SOLN: 5.0000 mg | Freq: Once | ORAL | Status: AC | PRN

## 2016-03-02 MED ORDER — CEFAZOLIN SODIUM-DEXTROSE 2-4 GM/100ML-% IV SOLN
2.0000 g | INTRAVENOUS | Status: AC
  Administered 2016-03-02: 2 g via INTRAVENOUS

## 2016-03-02 MED ORDER — BUPIVACAINE HCL (PF) 0.5 % IJ SOLN
INTRAMUSCULAR | Status: DC | PRN
  Administered 2016-03-02: 10 mL

## 2016-03-02 MED ORDER — OXYCODONE HCL 5 MG PO TABS
5.0000 mg | ORAL_TABLET | Freq: Once | ORAL | Status: AC | PRN
  Administered 2016-03-02: 5 mg via ORAL

## 2016-03-02 MED ORDER — ONDANSETRON HCL 4 MG/2ML IJ SOLN
INTRAMUSCULAR | Status: AC
  Filled 2016-03-02: qty 2

## 2016-03-02 MED ORDER — SODIUM CHLORIDE 0.9 % IR SOLN
Status: DC | PRN
  Administered 2016-03-02: 6000 mL

## 2016-03-02 MED ORDER — LIDOCAINE 2% (20 MG/ML) 5 ML SYRINGE
INTRAMUSCULAR | Status: AC
  Filled 2016-03-02: qty 5

## 2016-03-02 MED ORDER — CEFAZOLIN SODIUM-DEXTROSE 2-4 GM/100ML-% IV SOLN
INTRAVENOUS | Status: AC
  Filled 2016-03-02: qty 100

## 2016-03-02 MED ORDER — LIDOCAINE HCL (CARDIAC) 20 MG/ML IV SOLN
INTRAVENOUS | Status: DC | PRN
  Administered 2016-03-02: 100 mg via INTRATRACHEAL

## 2016-03-02 MED ORDER — OXYCODONE HCL 5 MG PO TABS
ORAL_TABLET | ORAL | Status: DC
Start: 2016-03-02 — End: 2016-03-02
  Filled 2016-03-02: qty 1

## 2016-03-02 MED ORDER — MORPHINE SULFATE (PF) 4 MG/ML IV SOLN
INTRAVENOUS | Status: AC
  Filled 2016-03-02: qty 1

## 2016-03-02 MED ORDER — CHLORHEXIDINE GLUCONATE 4 % EX LIQD: 60.0000 mL | Freq: Once | CUTANEOUS | Status: DC

## 2016-03-02 MED ORDER — FENTANYL CITRATE (PF) 100 MCG/2ML IJ SOLN: 25.0000 ug | INTRAMUSCULAR | Status: DC | PRN

## 2016-03-02 MED ORDER — MORPHINE SULFATE (PF) 4 MG/ML IV SOLN
INTRAVENOUS | Status: DC | PRN
  Administered 2016-03-02: 4 mg

## 2016-03-02 MED ORDER — LACTATED RINGERS IV SOLN
INTRAVENOUS | Status: DC
  Administered 2016-03-02: 14:00:00 via INTRAVENOUS

## 2016-03-02 MED ORDER — MIDAZOLAM HCL 2 MG/2ML IJ SOLN
INTRAMUSCULAR | Status: DC | PRN
  Administered 2016-03-02: 2 mg via INTRAVENOUS

## 2016-03-02 MED ORDER — FENTANYL CITRATE (PF) 100 MCG/2ML IJ SOLN
INTRAMUSCULAR | Status: AC
  Filled 2016-03-02: qty 4

## 2016-03-02 MED ORDER — PROPOFOL 10 MG/ML IV BOLUS
INTRAVENOUS | Status: DC | PRN
  Administered 2016-03-02: 200 mg via INTRAVENOUS
  Administered 2016-03-02 (×2): 100 mg via INTRAVENOUS

## 2016-03-02 MED ORDER — FENTANYL CITRATE (PF) 100 MCG/2ML IJ SOLN
25.0000 ug | INTRAMUSCULAR | Status: DC | PRN
  Administered 2016-03-02 (×2): 50 ug via INTRAVENOUS

## 2016-03-02 MED ORDER — FENTANYL CITRATE (PF) 100 MCG/2ML IJ SOLN
INTRAMUSCULAR | Status: DC | PRN
  Administered 2016-03-02 (×4): 50 ug via INTRAVENOUS

## 2016-03-02 SURGICAL SUPPLY — 37 items
BANDAGE ACE 6X5 VEL STRL LF (GAUZE/BANDAGES/DRESSINGS) ×2 IMPLANT
BANDAGE ELASTIC 6 VELCRO ST LF (GAUZE/BANDAGES/DRESSINGS) ×2 IMPLANT
BLADE GREAT WHITE 4.2 (BLADE) ×2 IMPLANT
BLADE SURG 11 STRL SS (BLADE) IMPLANT
BNDG GAUZE ELAST 4 BULKY (GAUZE/BANDAGES/DRESSINGS) ×2 IMPLANT
COVER SURGICAL LIGHT HANDLE (MISCELLANEOUS) IMPLANT
CUFF TOURNIQUET SINGLE 34IN LL (TOURNIQUET CUFF) IMPLANT
CUFF TOURNIQUET SINGLE 44IN (TOURNIQUET CUFF) IMPLANT
DRAPE ARTHROSCOPY W/POUCH 114 (DRAPES) ×2 IMPLANT
DRAPE HALF SHEET 40X57 (DRAPES) ×2 IMPLANT
DRAPE IMP U-DRAPE 54X76 (DRAPES) ×2 IMPLANT
DRAPE U-SHAPE 47X51 STRL (DRAPES) ×2 IMPLANT
DRSG EMULSION OIL 3X3 NADH (GAUZE/BANDAGES/DRESSINGS) ×2 IMPLANT
DRSG PAD ABDOMINAL 8X10 ST (GAUZE/BANDAGES/DRESSINGS) ×2 IMPLANT
DURAPREP 26ML APPLICATOR (WOUND CARE) ×2 IMPLANT
GAUZE SPONGE 4X4 12PLY STRL (GAUZE/BANDAGES/DRESSINGS) ×2 IMPLANT
GLOVE BIO SURGEON STRL SZ8 (GLOVE) ×8 IMPLANT
GLOVE BIOGEL PI IND STRL 8 (GLOVE) ×1 IMPLANT
GLOVE BIOGEL PI INDICATOR 8 (GLOVE) ×1
GOWN STRL REUS W/ TWL LRG LVL3 (GOWN DISPOSABLE) ×3 IMPLANT
GOWN STRL REUS W/ TWL XL LVL3 (GOWN DISPOSABLE) ×3 IMPLANT
GOWN STRL REUS W/TWL 2XL LVL3 (GOWN DISPOSABLE) ×2 IMPLANT
GOWN STRL REUS W/TWL LRG LVL3 (GOWN DISPOSABLE) ×6
GOWN STRL REUS W/TWL XL LVL3 (GOWN DISPOSABLE) ×6
KIT ROOM TURNOVER OR (KITS) ×2 IMPLANT
MANIFOLD NEPTUNE II (INSTRUMENTS) ×2 IMPLANT
NEEDLE 18GX1X1/2 (RX/OR ONLY) (NEEDLE) ×2 IMPLANT
NEEDLE 22X1 1/2 (OR ONLY) (NEEDLE) IMPLANT
NEEDLE SPNL 18GX3.5 QUINCKE PK (NEEDLE) ×2 IMPLANT
PACK ARTHROSCOPY DSU (CUSTOM PROCEDURE TRAY) ×2 IMPLANT
PAD ARMBOARD 7.5X6 YLW CONV (MISCELLANEOUS) ×4 IMPLANT
SET ARTHROSCOPY TUBING (MISCELLANEOUS) ×2
SET ARTHROSCOPY TUBING LN (MISCELLANEOUS) ×1 IMPLANT
SPONGE LAP 18X18 X RAY DECT (DISPOSABLE) ×2 IMPLANT
SYR CONTROL 10ML LL (SYRINGE) ×2 IMPLANT
TOWEL OR 17X24 6PK STRL BLUE (TOWEL DISPOSABLE) ×4 IMPLANT
WATER STERILE IRR 1000ML POUR (IV SOLUTION) ×2 IMPLANT

## 2016-03-02 NOTE — Interval H&P Note (Signed)
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 After consideration of risks, benefits and other options for treatment, the patient has consented to  Procedure(s): ARTHROSCOPY KNEE (Left) as a surgical intervention .  The patient's history has been reviewed, patient examined, no change in status, stable for surgery.  I have reviewed the patient's chart and labs.  Questions were answered to the patient's satisfaction.     Ava Tangney G

## 2016-03-02 NOTE — Op Note (Signed)
ZHYON MODESTE [id number] 03/02/2016   PRE-OP DIAGNOSIS: left knee TMM and CM  POST-OP DIAGNOSIS: same  PROCEDURE: left knee PMM and CP/AB  ANESTHESIA: general  Bari Leib G   Dictation #:  ?

## 2016-03-02 NOTE — Anesthesia Procedure Notes (Signed)
Procedure Name: LMA Insertion Date/Time: 03/02/2016 2:36 PM Performed by: Lance Coon Pre-anesthesia Checklist: Patient identified, Emergency Drugs available, Suction available, Patient being monitored and Timeout performed Patient Re-evaluated:Patient Re-evaluated prior to inductionOxygen Delivery Method: Circle system utilized Preoxygenation: Pre-oxygenation with 100% oxygen Intubation Type: IV induction LMA: LMA inserted LMA Size: 5.0 Tube secured with: Tape Dental Injury: Teeth and Oropharynx as per pre-operative assessment

## 2016-03-02 NOTE — Anesthesia Preprocedure Evaluation (Addendum)
Anesthesia Evaluation  Patient identified by MRN, date of birth, ID band Patient awake    Reviewed: Allergy & Precautions, NPO status , Patient's Chart, lab work & pertinent test results  Airway Mallampati: II  TM Distance: >3 FB Neck ROM: Full    Dental  (+) Teeth Intact, Dental Advisory Given   Pulmonary    breath sounds clear to auscultation       Cardiovascular  Rhythm:Regular Rate:Normal     Neuro/Psych    GI/Hepatic   Endo/Other  diabetes  Renal/GU      Musculoskeletal   Abdominal   Peds  Hematology   Anesthesia Other Findings   Reproductive/Obstetrics                             Anesthesia Physical Anesthesia Plan  ASA: III  Anesthesia Plan: General   Post-op Pain Management:  Regional for Post-op pain   Induction: Intravenous  Airway Management Planned: LMA  Additional Equipment:   Intra-op Plan:   Post-operative Plan:   Informed Consent: I have reviewed the patients History and Physical, chart, labs and discussed the procedure including the risks, benefits and alternatives for the proposed anesthesia with the patient or authorized representative who has indicated his/her understanding and acceptance.   Dental advisory given  Plan Discussed with: CRNA and Anesthesiologist  Anesthesia Plan Comments:        Anesthesia Quick Evaluation

## 2016-03-02 NOTE — Progress Notes (Signed)
Orthopedic Tech Progress Note Patient Details:  Stephen Arnold May 23, 1965 [id number]  Ortho Devices Type of Ortho Device: Crutches Ortho Device/Splint Interventions: Adjustment Crutches ordered by RN on shift.  Kristopher Oppenheim 03/02/2016, 7:11 PM

## 2016-03-02 NOTE — Anesthesia Postprocedure Evaluation (Signed)
Anesthesia Post Note  Patient: DOYEL TALBOT  Procedure(s) Performed: Procedure(s) (LRB): ARTHROSCOPY KNEE; PARTIAL MEDIAL MENISCECTOMY, CHONDROPLASTY (Left)  Patient location during evaluation: PACU Anesthesia Type: General Level of consciousness: awake Pain management: pain level controlled Vital Signs Assessment: post-procedure vital signs reviewed and stable Respiratory status: spontaneous breathing Cardiovascular status: stable Postop Assessment: no signs of nausea or vomiting Anesthetic complications: no    Last Vitals:  Vitals:   03/02/16 1716 03/02/16 1753  BP:    Pulse:  64  Resp:  (!) 21  Temp: 36.7 C 36.4 C    Last Pain:  Vitals:   03/02/16 1730  TempSrc:   PainSc: 7                  Jeneal Vogl

## 2016-03-02 NOTE — Transfer of Care (Signed)
Immediate Anesthesia Transfer of Care Note  Patient: Stephen Arnold  Procedure(s) Performed: Procedure(s): ARTHROSCOPY KNEE; PARTIAL MEDIAL MENISCECTOMY, CHONDROPLASTY (Left)  Patient Location: PACU  Anesthesia Type:General  Level of Consciousness: awake, alert , oriented and patient cooperative  Airway & Oxygen Therapy: Patient Spontanous Breathing  Post-op Assessment: Report given to RN, Post -op Vital signs reviewed and stable and Patient moving all extremities X 4  Post vital signs: Reviewed and stable  Last Vitals:  Vitals:   03/02/16 1317 03/02/16 1716  BP: 132/71   Pulse: 72   Resp: 20   Temp: 37.2 C (P) 36.7 C    Last Pain:  Vitals:   03/02/16 1716  TempSrc:   PainSc: (P) 2          Complications: No apparent anesthesia complications

## 2016-03-03 ENCOUNTER — Encounter (HOSPITAL_COMMUNITY): Payer: Self-pay | Admitting: Orthopaedic Surgery

## 2016-03-03 MED FILL — HYDROCODON-APAP 5-325: 5-325 | 3 days supply | Qty: 30 | Fill #0

## 2016-03-03 NOTE — Op Note (Signed)
NAMEGEORDI, BLYTHER NO.:  [id number]  MEDICAL RECORD NO.:  [id number]  LOCATION:  [facility]                         FACILITY:  [facility]  PHYSICIAN:  Monico Blitz. Trine Fread, M.D.DATE OF BIRTH:  [birth year]  DATE OF PROCEDURE:  03/02/2016 DATE OF DISCHARGE:  03/02/2016                              OPERATIVE REPORT   PREOPERATIVE DIAGNOSES: 1. Left knee torn medial meniscus. 2. Left knee chondromalacia.  POSTOPERATIVE DIAGNOSES: 1. Left knee torn medial meniscus. 2. Left knee chondromalacia.  PROCEDURES: 1. Left knee partial medial meniscectomy. 2. Left knee abrasion arthroplasty, patellofemoral.  ANESTHESIA:  General.  ATTENDING SURGEON:  Monico Blitz. Rhona Raider, M.D.  ASSISTANT:  Loni Dolly, PA.  INDICATION FOR PROCEDURE:  The patient is a 50 year old man with a long history of left knee issues.  He has had arthroscopy several times in the past.  He suffered an exacerbation of his difficulty a couple of months ago when he stepped in a hole.  He is persisted with difficulty despite aspiration on a couple of occasions and use of pills and a brace.  By an ultrasound study, he has think suggestive of a medial meniscus tear.  He has a fairly good joint space maintenance on standing films and he is offered another arthroscopy.  Informed operative consent was obtained after discussion of possible complications including reaction to anesthesia and infection.  SUMMARY OF FINDINGS AND PROCEDURE:  Under general anesthesia, a left knee arthroscopy was performed.  Suprapatellar pouch was benign while the patellofemoral joint exhibited just some focal breakdown at the apex of patella, addressed with chondroplasty and abrasion to bleeding bone in 1 tiny area.  The patella tracked fairly well.  He had some small cartilaginous loose bodies, which I removed.  Both gutters were benign. In the medial compartment, he had a displaceable tear of the posterior horn of the medial meniscus.   About a 5% partial medial meniscectomy was done.  He also had some grade 2 and at most just focal grade 3 change medial.  There was no exposed bone.  ACL was normal.  The lateral compartment exhibited no evidence of meniscal or articular cartilage injury.  He was discharged home the same day.  DESCRIPTION OF PROCEDURE:  The patient was taken to the operating suite where general anesthetic was applied without difficulty.  He was positioned supine and prepped and draped in normal sterile fashion. After the administration of preop IV Kefzol and appropriate time-out, an arthroscopy of the left knee was performed through a total of 2 portals. Findings were as noted above and the procedure consisted of the partial medial meniscectomy which was done with a basket and shaver back to a stable rim.  This involved only the posterior horn of the medial meniscus.  We then performed the aforementioned chondroplasties and abrasions.  The knee was thoroughly irrigated at the end of the case followed by placement of some Marcaine plus morphine into the knee. Adaptic was placed over the portals followed by dry gauze and a loose Ace wrap.  Estimated blood loss and intraoperative fluids can be obtained from anesthesia records.  DISPOSITION:  The patient was extubated in the operating room and taken to recovery room in stable  condition.  Plans were for him to go home the same day and to follow up in the office in less than a week.  I will try to contact him by phone tonight.     Monico Blitz Rhona Raider, M.D.     PGD/MEDQ  D:  03/02/2016  T:  03/03/2016  Job:  [id number]

## 2016-03-09 MED FILL — HYDROCODON-APAP 5-325: 5-325 | 7 days supply | Qty: 30 | Fill #0

## 2016-03-15 DIAGNOSIS — M25562 Pain in left knee: Secondary | ICD-10-CM | POA: Diagnosis not present

## 2016-03-24 MED FILL — HYDROCODON-APAP 5-325: 5-325 | 10 days supply | Qty: 30 | Fill #0

## 2016-04-05 DIAGNOSIS — M65312 Trigger thumb, left thumb: Secondary | ICD-10-CM | POA: Diagnosis not present

## 2016-04-05 DIAGNOSIS — M25562 Pain in left knee: Secondary | ICD-10-CM | POA: Diagnosis not present

## 2016-04-15 MED FILL — HYDROCODON-APAP 5-325: 5-325 | 10 days supply | Qty: 30 | Fill #0

## 2016-04-16 MED FILL — HUMALOG 100 UNITS/ML KWIKPE: 100 | 50 days supply | Qty: 30 | Fill #1

## 2016-04-16 MED FILL — BD PEN NDL NANO 32GX5/32: 32G X 4 MM | 28 days supply | Qty: 200 | Fill #1

## 2016-04-16 MED FILL — BD PEN NDL NANO 32GX5/32": 32G X 4 MM | 28 days supply | Qty: 200 | Fill #1

## 2016-05-04 MED FILL — ACETAMINOPHEN/COD #3 TABLET: 300-30 | 7 days supply | Qty: 30 | Fill #0

## 2016-05-10 DIAGNOSIS — M65311 Trigger thumb, right thumb: Secondary | ICD-10-CM | POA: Diagnosis not present

## 2016-05-11 ENCOUNTER — Telehealth (HOSPITAL_COMMUNITY): Payer: Self-pay | Admitting: *Deleted

## 2016-05-11 ENCOUNTER — Other Ambulatory Visit: Payer: Self-pay | Admitting: Internal Medicine

## 2016-05-11 ENCOUNTER — Other Ambulatory Visit (HOSPITAL_COMMUNITY): Payer: Self-pay | Admitting: Internal Medicine

## 2016-05-11 DIAGNOSIS — R0789 Other chest pain: Secondary | ICD-10-CM | POA: Diagnosis not present

## 2016-05-11 DIAGNOSIS — R109 Unspecified abdominal pain: Secondary | ICD-10-CM

## 2016-05-11 DIAGNOSIS — R110 Nausea: Secondary | ICD-10-CM | POA: Diagnosis not present

## 2016-05-11 DIAGNOSIS — R438 Other disturbances of smell and taste: Secondary | ICD-10-CM | POA: Diagnosis not present

## 2016-05-11 DIAGNOSIS — E785 Hyperlipidemia, unspecified: Secondary | ICD-10-CM

## 2016-05-11 NOTE — Telephone Encounter (Signed)
Left message on voicemail in reference to upcoming appointment scheduled for 05/13/16. Phone number given for a call back so details instructions can be given. Onaje Warne, Ranae Palms

## 2016-05-12 ENCOUNTER — Ambulatory Visit (HOSPITAL_COMMUNITY): Payer: 59

## 2016-05-12 ENCOUNTER — Encounter (HOSPITAL_COMMUNITY): Payer: Self-pay

## 2016-05-12 MED FILL — TOUJEO SOLOSTAR 300 UNITS/M: 300 | 90 days supply | Qty: 18 | Fill #0

## 2016-05-13 ENCOUNTER — Encounter (HOSPITAL_COMMUNITY): Payer: 59

## 2016-05-13 ENCOUNTER — Telehealth (HOSPITAL_COMMUNITY): Payer: Self-pay | Admitting: *Deleted

## 2016-05-13 NOTE — Telephone Encounter (Signed)
Left message on voicemail in reference to upcoming appointment scheduled for 05/17/16. Phone number given for a call back so details instructions can be given.  Stephen Arnold

## 2016-05-14 ENCOUNTER — Ambulatory Visit
Admission: RE | Admit: 2016-05-14 | Discharge: 2016-05-14 | Disposition: A | Discharge status: Home / Self Care | Payer: 59 | Source: Ambulatory Visit | Attending: Internal Medicine | Admitting: Internal Medicine

## 2016-05-14 DIAGNOSIS — R110 Nausea: Secondary | ICD-10-CM

## 2016-05-14 DIAGNOSIS — R1013 Epigastric pain: Secondary | ICD-10-CM | POA: Diagnosis not present

## 2016-05-14 DIAGNOSIS — R109 Unspecified abdominal pain: Secondary | ICD-10-CM

## 2016-05-17 ENCOUNTER — Ambulatory Visit (INDEPENDENT_AMBULATORY_CARE_PROVIDER_SITE_OTHER): Payer: 59 | Admitting: Internal Medicine

## 2016-05-17 ENCOUNTER — Observation Stay (HOSPITAL_COMMUNITY)
Admission: AD | Admit: 2016-05-17 | Discharge: 2016-05-19 | Disposition: A | Discharge status: Home / Self Care | Payer: 59 | Source: Ambulatory Visit | Attending: Internal Medicine | Admitting: Internal Medicine

## 2016-05-17 ENCOUNTER — Encounter: Payer: Self-pay | Admitting: Internal Medicine

## 2016-05-17 ENCOUNTER — Ambulatory Visit (HOSPITAL_BASED_OUTPATIENT_CLINIC_OR_DEPARTMENT_OTHER): Payer: 59

## 2016-05-17 VITALS — BP 170/89 | HR 65 | Ht 72.0 in | Wt 232.4 lb

## 2016-05-17 DIAGNOSIS — Z87820 Personal history of traumatic brain injury: Secondary | ICD-10-CM | POA: Diagnosis not present

## 2016-05-17 DIAGNOSIS — R079 Chest pain, unspecified: Secondary | ICD-10-CM

## 2016-05-17 DIAGNOSIS — R0789 Other chest pain: Secondary | ICD-10-CM

## 2016-05-17 DIAGNOSIS — E119 Type 2 diabetes mellitus without complications: Secondary | ICD-10-CM | POA: Diagnosis not present

## 2016-05-17 DIAGNOSIS — R110 Nausea: Secondary | ICD-10-CM | POA: Diagnosis not present

## 2016-05-17 DIAGNOSIS — Z794 Long term (current) use of insulin: Secondary | ICD-10-CM | POA: Diagnosis not present

## 2016-05-17 DIAGNOSIS — I25110 Atherosclerotic heart disease of native coronary artery with unstable angina pectoris: Secondary | ICD-10-CM | POA: Diagnosis not present

## 2016-05-17 DIAGNOSIS — I10 Essential (primary) hypertension: Secondary | ICD-10-CM | POA: Insufficient documentation

## 2016-05-17 DIAGNOSIS — Z8249 Family history of ischemic heart disease and other diseases of the circulatory system: Secondary | ICD-10-CM | POA: Diagnosis not present

## 2016-05-17 DIAGNOSIS — E785 Hyperlipidemia, unspecified: Secondary | ICD-10-CM | POA: Insufficient documentation

## 2016-05-17 DIAGNOSIS — F419 Anxiety disorder, unspecified: Secondary | ICD-10-CM | POA: Diagnosis not present

## 2016-05-17 DIAGNOSIS — Z01812 Encounter for preprocedural laboratory examination: Secondary | ICD-10-CM

## 2016-05-17 DIAGNOSIS — Z955 Presence of coronary angioplasty implant and graft: Secondary | ICD-10-CM

## 2016-05-17 DIAGNOSIS — R931 Abnormal findings on diagnostic imaging of heart and coronary circulation: Secondary | ICD-10-CM

## 2016-05-17 DIAGNOSIS — I200 Unstable angina: Secondary | ICD-10-CM

## 2016-05-17 DIAGNOSIS — M1990 Unspecified osteoarthritis, unspecified site: Secondary | ICD-10-CM | POA: Insufficient documentation

## 2016-05-17 LAB — COMPREHENSIVE METABOLIC PANEL
ALK PHOS: 47 U/L (ref 38–126)
ALT: 17 U/L (ref 17–63)
ANION GAP: 12 (ref 5–15)
AST: 16 U/L (ref 15–41)
Albumin: 3.9 g/dL (ref 3.5–5.0)
BUN: 12 mg/dL (ref 6–20)
CALCIUM: 9.6 mg/dL (ref 8.9–10.3)
CO2: 27 mmol/L (ref 22–32)
CREATININE: 0.75 mg/dL (ref 0.61–1.24)
Chloride: 101 mmol/L (ref 101–111)
Glucose, Bld: 138 mg/dL — ABNORMAL HIGH (ref 65–99)
Potassium: 3.9 mmol/L (ref 3.5–5.1)
Sodium: 140 mmol/L (ref 135–145)
Total Bilirubin: 0.5 mg/dL (ref 0.3–1.2)
Total Protein: 7.2 g/dL (ref 6.5–8.1)

## 2016-05-17 LAB — CBC WITH DIFFERENTIAL/PLATELET
Basophils Absolute: 0 10*3/uL (ref 0.0–0.1)
Basophils Relative: 0 %
EOS ABS: 0.2 10*3/uL (ref 0.0–0.7)
EOS PCT: 2 %
HCT: 39.4 % (ref 39.0–52.0)
HEMOGLOBIN: 13.2 g/dL (ref 13.0–17.0)
LYMPHS ABS: 2.6 10*3/uL (ref 0.7–4.0)
LYMPHS PCT: 29 %
MCH: 28 pg (ref 26.0–34.0)
MCHC: 33.5 g/dL (ref 30.0–36.0)
MCV: 83.5 fL (ref 78.0–100.0)
MONOS PCT: 6 %
Monocytes Absolute: 0.5 10*3/uL (ref 0.1–1.0)
Neutro Abs: 5.6 10*3/uL (ref 1.7–7.7)
Neutrophils Relative %: 63 %
PLATELETS: 363 10*3/uL (ref 150–400)
RBC: 4.72 MIL/uL (ref 4.22–5.81)
RDW: 12.4 % (ref 11.5–15.5)
WBC: 9 10*3/uL (ref 4.0–10.5)

## 2016-05-17 LAB — PROTIME-INR
INR: 1.06
PROTHROMBIN TIME: 13.8 s (ref 11.4–15.2)

## 2016-05-17 LAB — MYOCARDIAL PERFUSION IMAGING
CHL CUP NUCLEAR SRS: 6
CSEPPHR: 122 {beats}/min
LV sys vol: 67 mL
LVDIAVOL: 137 mL (ref 62–150)
NUC STRESS TID: 1.16
RATE: 0.34
Rest HR: 76 {beats}/min
SDS: 5
SSS: 10

## 2016-05-17 LAB — TSH: TSH: 1.31 u[IU]/mL (ref 0.350–4.500)

## 2016-05-17 LAB — GLUCOSE, CAPILLARY: GLUCOSE-CAPILLARY: 184 mg/dL — AB (ref 65–99)

## 2016-05-17 MED ORDER — SODIUM CHLORIDE 0.9% FLUSH: 3.0000 mL | INTRAVENOUS | Status: DC | PRN

## 2016-05-17 MED ORDER — INSULIN ASPART 100 UNIT/ML ~~LOC~~ SOLN
18.0000 [IU] | Freq: Three times a day (TID) | SUBCUTANEOUS | Status: DC
Start: 2016-05-17 — End: 2016-05-19
  Administered 2016-05-17 – 2016-05-19 (×4): 18 [IU] via SUBCUTANEOUS

## 2016-05-17 MED ORDER — HEPARIN SODIUM (PORCINE) 5000 UNIT/ML IJ SOLN
5000.0000 [IU] | Freq: Three times a day (TID) | INTRAMUSCULAR | Status: DC
  Administered 2016-05-17 – 2016-05-18 (×3): 5000 [IU] via SUBCUTANEOUS
  Filled 2016-05-17 (×3): qty 1

## 2016-05-17 MED ORDER — ASPIRIN 81 MG PO CHEW
81.0000 mg | CHEWABLE_TABLET | ORAL | Status: AC
  Administered 2016-05-18: 81 mg via ORAL
  Filled 2016-05-17: qty 1

## 2016-05-17 MED ORDER — ACETAMINOPHEN 325 MG PO TABS
650.0000 mg | ORAL_TABLET | ORAL | Status: DC | PRN
  Administered 2016-05-17 – 2016-05-18 (×2): 650 mg via ORAL
  Filled 2016-05-17 (×2): qty 2

## 2016-05-17 MED ORDER — INSULIN ASPART 100 UNIT/ML ~~LOC~~ SOLN: 18.0000 [IU] | Freq: Three times a day (TID) | SUBCUTANEOUS | Status: DC

## 2016-05-17 MED ORDER — ASPIRIN EC 81 MG PO TBEC
81.0000 mg | DELAYED_RELEASE_TABLET | Freq: Every day | ORAL | Status: DC
  Administered 2016-05-19: 10:00:00 81 mg via ORAL
  Filled 2016-05-17: qty 1

## 2016-05-17 MED ORDER — SODIUM CHLORIDE 0.9 % IV SOLN
INTRAVENOUS | Status: DC
  Administered 2016-05-17 – 2016-05-18 (×2): via INTRAVENOUS

## 2016-05-17 MED ORDER — TECHNETIUM TC 99M TETROFOSMIN IV KIT
10.1000 | PACK | Freq: Once | INTRAVENOUS | Status: AC | PRN
  Administered 2016-05-17: 10.1 via INTRAVENOUS
  Filled 2016-05-17: qty 11

## 2016-05-17 MED ORDER — SODIUM CHLORIDE 0.9 % IV SOLN: 250.0000 mL | INTRAVENOUS | Status: DC | PRN

## 2016-05-17 MED ORDER — TECHNETIUM TC 99M TETROFOSMIN IV KIT
32.5000 | PACK | Freq: Once | INTRAVENOUS | Status: AC | PRN
  Administered 2016-05-17: 32.5 via INTRAVENOUS
  Filled 2016-05-17: qty 33

## 2016-05-17 MED ORDER — INSULIN GLARGINE 100 UNIT/ML ~~LOC~~ SOLN
30.0000 [IU] | Freq: Every day | SUBCUTANEOUS | Status: DC
  Administered 2016-05-17 – 2016-05-18 (×2): 30 [IU] via SUBCUTANEOUS
  Filled 2016-05-17 (×2): qty 0.3

## 2016-05-17 MED ORDER — NITROGLYCERIN 0.4 MG SL SUBL: 0.4000 mg | SUBLINGUAL_TABLET | SUBLINGUAL | Status: DC | PRN

## 2016-05-17 MED ORDER — ONDANSETRON HCL 4 MG/2ML IJ SOLN: 4.0000 mg | Freq: Four times a day (QID) | INTRAMUSCULAR | Status: DC | PRN

## 2016-05-17 MED ORDER — ASPIRIN 300 MG RE SUPP: 300.0000 mg | RECTAL | Status: AC

## 2016-05-17 MED ORDER — ZOLPIDEM TARTRATE 5 MG PO TABS
5.0000 mg | ORAL_TABLET | Freq: Once | ORAL | Status: AC
Start: 2016-05-17 — End: 2016-05-17
  Administered 2016-05-17: 5 mg via ORAL
  Filled 2016-05-17: qty 1

## 2016-05-17 MED ORDER — ASPIRIN 81 MG PO CHEW
324.0000 mg | CHEWABLE_TABLET | ORAL | Status: AC
Start: 2016-05-17 — End: 2016-05-17
  Administered 2016-05-17: 324 mg via ORAL
  Filled 2016-05-17: qty 4

## 2016-05-17 MED ORDER — SODIUM CHLORIDE 0.9% FLUSH
3.0000 mL | Freq: Two times a day (BID) | INTRAVENOUS | Status: DC
  Administered 2016-05-17 – 2016-05-18 (×3): 3 mL via INTRAVENOUS

## 2016-05-17 MED ORDER — REGADENOSON 0.4 MG/5ML IV SOLN
0.4000 mg | Freq: Once | INTRAVENOUS | Status: AC
  Administered 2016-05-17: 0.4 mg via INTRAVENOUS

## 2016-05-17 MED ORDER — SODIUM CHLORIDE 0.9% FLUSH
3.0000 mL | Freq: Two times a day (BID) | INTRAVENOUS | Status: DC
  Administered 2016-05-17: 3 mL via INTRAVENOUS
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
## 2016-05-17 NOTE — Patient Instructions (Signed)
Dr. Harrington Challenger has recommended that you be admitted to the hospital for observation and further testing.
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
## 2016-05-18 ENCOUNTER — Encounter (HOSPITAL_COMMUNITY)
Admission: AD | Disposition: A | Discharge status: Home / Self Care | Payer: Self-pay | Source: Ambulatory Visit | Attending: Internal Medicine

## 2016-05-18 ENCOUNTER — Other Ambulatory Visit: Payer: Self-pay

## 2016-05-18 ENCOUNTER — Ambulatory Visit (HOSPITAL_COMMUNITY): Admission: RE | Admit: 2016-05-18 | Payer: 59 | Source: Ambulatory Visit | Admitting: Cardiology

## 2016-05-18 ENCOUNTER — Encounter (HOSPITAL_COMMUNITY): Payer: Self-pay | Admitting: Cardiology

## 2016-05-18 DIAGNOSIS — I200 Unstable angina: Secondary | ICD-10-CM

## 2016-05-18 DIAGNOSIS — I25110 Atherosclerotic heart disease of native coronary artery with unstable angina pectoris: Secondary | ICD-10-CM

## 2016-05-18 DIAGNOSIS — Z87820 Personal history of traumatic brain injury: Secondary | ICD-10-CM | POA: Diagnosis not present

## 2016-05-18 DIAGNOSIS — E119 Type 2 diabetes mellitus without complications: Secondary | ICD-10-CM | POA: Diagnosis not present

## 2016-05-18 DIAGNOSIS — Z8249 Family history of ischemic heart disease and other diseases of the circulatory system: Secondary | ICD-10-CM | POA: Diagnosis not present

## 2016-05-18 DIAGNOSIS — F419 Anxiety disorder, unspecified: Secondary | ICD-10-CM | POA: Diagnosis not present

## 2016-05-18 DIAGNOSIS — E785 Hyperlipidemia, unspecified: Secondary | ICD-10-CM | POA: Diagnosis not present

## 2016-05-18 DIAGNOSIS — Z794 Long term (current) use of insulin: Secondary | ICD-10-CM | POA: Diagnosis not present

## 2016-05-18 DIAGNOSIS — M1990 Unspecified osteoarthritis, unspecified site: Secondary | ICD-10-CM | POA: Diagnosis not present

## 2016-05-18 DIAGNOSIS — I10 Essential (primary) hypertension: Secondary | ICD-10-CM | POA: Diagnosis not present

## 2016-05-18 HISTORY — PX: CARDIAC CATHETERIZATION: SHX172

## 2016-05-18 LAB — BASIC METABOLIC PANEL
Anion gap: 9 (ref 5–15)
BUN: 12 mg/dL (ref 6–20)
CHLORIDE: 106 mmol/L (ref 101–111)
CO2: 27 mmol/L (ref 22–32)
CREATININE: 0.77 mg/dL (ref 0.61–1.24)
Calcium: 9.2 mg/dL (ref 8.9–10.3)
GFR calc Af Amer: >60 mL/min (ref >60)
GFR calc non Af Amer: >60 mL/min (ref >60)
Glucose, Bld: 62 mg/dL — ABNORMAL LOW (ref 65–99)
Potassium: 3.5 mmol/L (ref 3.5–5.1)
Sodium: 142 mmol/L (ref 135–145)

## 2016-05-18 LAB — GLUCOSE, CAPILLARY
GLUCOSE-CAPILLARY: 69 mg/dL (ref 65–99)
GLUCOSE-CAPILLARY: 204 mg/dL — AB (ref 65–99)
Glucose-Capillary: 73 mg/dL (ref 65–99)
Glucose-Capillary: 87 mg/dL (ref 65–99)
Glucose-Capillary: 167 mg/dL — ABNORMAL HIGH (ref 65–99)
Glucose-Capillary: 159 mg/dL — ABNORMAL HIGH (ref 65–99)
Glucose-Capillary: 121 mg/dL — ABNORMAL HIGH (ref 65–99)

## 2016-05-18 LAB — LIPID PANEL
Cholesterol: 195 mg/dL (ref 0–200)
HDL: 37 mg/dL — AB (ref >40)
LDL CALC: 131 mg/dL — AB (ref 0–99)
TRIGLYCERIDES: 134 mg/dL (ref <150)
Total CHOL/HDL Ratio: 5.3 RATIO
VLDL: 27 mg/dL (ref 0–40)

## 2016-05-18 LAB — POCT ACTIVATED CLOTTING TIME
Activated Clotting Time: 274 seconds
Activated Clotting Time: 362 seconds

## 2016-05-18 SURGERY — LEFT HEART CATH AND CORONARY ANGIOGRAPHY

## 2016-05-18 MED ORDER — LABETALOL HCL 5 MG/ML IV SOLN: 10.0000 mg | INTRAVENOUS | Status: AC | PRN

## 2016-05-18 MED ORDER — METOPROLOL SUCCINATE ER 25 MG PO TB24
25.0000 mg | ORAL_TABLET | Freq: Every day | ORAL | Status: DC
  Administered 2016-05-18 – 2016-05-19 (×2): 25 mg via ORAL
  Filled 2016-05-18 (×2): qty 1

## 2016-05-18 MED ORDER — ROSUVASTATIN CALCIUM 10 MG PO TABS
5.0000 mg | ORAL_TABLET | Freq: Every day | ORAL | Status: DC
  Administered 2016-05-18: 18:00:00 5 mg via ORAL
  Filled 2016-05-18: qty 1

## 2016-05-18 MED ORDER — HEPARIN SODIUM (PORCINE) 1000 UNIT/ML IJ SOLN
INTRAMUSCULAR | Status: AC
  Filled 2016-05-18: qty 1

## 2016-05-18 MED ORDER — SODIUM CHLORIDE 0.9 % WEIGHT BASED INFUSION: 1.0000 mL/kg/h | INTRAVENOUS | Status: AC

## 2016-05-18 MED ORDER — LIDOCAINE HCL (PF) 1 % IJ SOLN
INTRAMUSCULAR | Status: DC | PRN
  Administered 2016-05-18: 2 mL

## 2016-05-18 MED ORDER — IOPAMIDOL (ISOVUE-370) INJECTION 76%
INTRAVENOUS | Status: AC
  Filled 2016-05-18: qty 100

## 2016-05-18 MED ORDER — HYDROCODONE-ACETAMINOPHEN 5-325 MG PO TABS
1.0000 | ORAL_TABLET | Freq: Four times a day (QID) | ORAL | Status: DC | PRN
  Administered 2016-05-18 (×2): 1 via ORAL
  Filled 2016-05-18 (×2): qty 1

## 2016-05-18 MED ORDER — HEPARIN SODIUM (PORCINE) 1000 UNIT/ML IJ SOLN
INTRAMUSCULAR | Status: DC | PRN
  Administered 2016-05-18: 2000 [IU] via INTRAVENOUS
  Administered 2016-05-18 (×2): 5000 [IU] via INTRAVENOUS

## 2016-05-18 MED ORDER — TICAGRELOR 90 MG PO TABS
90.0000 mg | ORAL_TABLET | Freq: Two times a day (BID) | ORAL | Status: DC
  Administered 2016-05-18 – 2016-05-19 (×2): 90 mg via ORAL
  Filled 2016-05-18 (×2): qty 1

## 2016-05-18 MED ORDER — SODIUM CHLORIDE 0.9% FLUSH: 3.0000 mL | Freq: Two times a day (BID) | INTRAVENOUS | Status: DC

## 2016-05-18 MED ORDER — HEPARIN (PORCINE) IN NACL 2-0.9 UNIT/ML-% IJ SOLN
INTRAMUSCULAR | Status: DC | PRN
  Administered 2016-05-18: 1000 mL

## 2016-05-18 MED ORDER — FENTANYL CITRATE (PF) 100 MCG/2ML IJ SOLN
INTRAMUSCULAR | Status: DC | PRN
  Administered 2016-05-18: 25 ug via INTRAVENOUS

## 2016-05-18 MED ORDER — MIDAZOLAM HCL 2 MG/2ML IJ SOLN
INTRAMUSCULAR | Status: DC | PRN
  Administered 2016-05-18: 2 mg via INTRAVENOUS

## 2016-05-18 MED ORDER — LIDOCAINE HCL (PF) 1 % IJ SOLN
INTRAMUSCULAR | Status: AC
  Filled 2016-05-18: qty 30

## 2016-05-18 MED ORDER — VERAPAMIL HCL 2.5 MG/ML IV SOLN
INTRAVENOUS | Status: AC
  Filled 2016-05-18: qty 2

## 2016-05-18 MED ORDER — IOPAMIDOL (ISOVUE-370) INJECTION 76%
INTRAVENOUS | Status: AC
  Filled 2016-05-18: qty 50

## 2016-05-18 MED ORDER — NITROGLYCERIN 1 MG/10 ML FOR IR/CATH LAB
INTRA_ARTERIAL | Status: DC | PRN
  Administered 2016-05-18 (×2): 200 ug via INTRACORONARY

## 2016-05-18 MED ORDER — SODIUM CHLORIDE 0.9% FLUSH: 3.0000 mL | INTRAVENOUS | Status: DC | PRN

## 2016-05-18 MED ORDER — HEPARIN (PORCINE) IN NACL 2-0.9 UNIT/ML-% IJ SOLN
INTRAMUSCULAR | Status: DC | PRN
  Administered 2016-05-18: 10 mL via INTRA_ARTERIAL

## 2016-05-18 MED ORDER — TICAGRELOR 90 MG PO TABS
ORAL_TABLET | ORAL | Status: AC
  Filled 2016-05-18: qty 2

## 2016-05-18 MED ORDER — MIDAZOLAM HCL 2 MG/2ML IJ SOLN
INTRAMUSCULAR | Status: AC
  Filled 2016-05-18: qty 2

## 2016-05-18 MED ORDER — IOPAMIDOL (ISOVUE-370) INJECTION 76%
INTRAVENOUS | Status: DC | PRN
  Administered 2016-05-18: 220 mL via INTRA_ARTERIAL

## 2016-05-18 MED ORDER — HEPARIN (PORCINE) IN NACL 2-0.9 UNIT/ML-% IJ SOLN
INTRAMUSCULAR | Status: AC
  Filled 2016-05-18: qty 1000

## 2016-05-18 MED ORDER — TICAGRELOR 90 MG PO TABS
ORAL_TABLET | ORAL | Status: DC | PRN
  Administered 2016-05-18: 180 mg via ORAL

## 2016-05-18 MED ORDER — IOPAMIDOL (ISOVUE-370) INJECTION 76%
INTRAVENOUS | Status: AC
Start: 2016-05-18 — End: 2016-05-18
  Filled 2016-05-18: qty 100

## 2016-05-18 MED ORDER — FENTANYL CITRATE (PF) 100 MCG/2ML IJ SOLN
INTRAMUSCULAR | Status: AC
  Filled 2016-05-18: qty 2

## 2016-05-18 MED ORDER — HYDRALAZINE HCL 20 MG/ML IJ SOLN: 5.0000 mg | INTRAMUSCULAR | Status: AC | PRN

## 2016-05-18 MED ORDER — NITROGLYCERIN 1 MG/10 ML FOR IR/CATH LAB
INTRA_ARTERIAL | Status: AC
  Filled 2016-05-18: qty 10

## 2016-05-18 MED ORDER — ANGIOPLASTY BOOK
Freq: Once | Status: AC
  Administered 2016-05-18: 21:00:00
  Filled 2016-05-18: qty 1

## 2016-05-18 MED ORDER — SODIUM CHLORIDE 0.9 % IV SOLN: 250.0000 mL | INTRAVENOUS | Status: DC | PRN

## 2016-05-18 SURGICAL SUPPLY — 26 items
BALLN EMERGE MR 2.0X12 (BALLOONS) ×3
BALLN EUPHORA RX 2.0X12 (BALLOONS) ×3
BALLN MOZEC 2.50X14 (BALLOONS) ×3
BALLN ~~LOC~~ EUPHORA RX 3.25X15 (BALLOONS) ×3
BALLN ~~LOC~~ MOZEC 2.25X15 (BALLOONS) ×2
BALLOON EMERGE MR 2.0X12 (BALLOONS) ×1 IMPLANT
BALLOON EUPHORA RX 2.0X12 (BALLOONS) ×1 IMPLANT
BALLOON MOZEC 2.50X14 (BALLOONS) ×1 IMPLANT
BALLOON ~~LOC~~ EUPHORA RX 3.25X15 (BALLOONS) ×1 IMPLANT
BALLOON ~~LOC~~ MOZEC 2.25X15 (BALLOONS) ×1 IMPLANT
CATH EXPO 5FR FR4 (CATHETERS) ×3 IMPLANT
CATH INFINITI 5 FR JL3.5 (CATHETERS) ×3 IMPLANT
CATH INFINITI 5FR ANG PIGTAIL (CATHETERS) ×3 IMPLANT
CATH VISTA GUIDE 6FR XBLAD3.5 (CATHETERS) ×3 IMPLANT
DEVICE RAD COMP TR BAND LRG (VASCULAR PRODUCTS) ×3 IMPLANT
GLIDESHEATH SLEND SS 6F .021 (SHEATH) ×3 IMPLANT
GUIDEWIRE INQWIRE 1.5J.035X260 (WIRE) ×1 IMPLANT
INQWIRE 1.5J .035X260CM (WIRE) ×3
KIT ENCORE 26 ADVANTAGE (KITS) ×3 IMPLANT
KIT HEART LEFT (KITS) ×3 IMPLANT
PACK CARDIAC CATHETERIZATION (CUSTOM PROCEDURE TRAY) ×3 IMPLANT
STENT PROMUS PREM MR 2.25X20 (Permanent Stent) ×3 IMPLANT
STENT PROMUS PREM MR 3.0X16 (Permanent Stent) ×3 IMPLANT
TRANSDUCER W/STOPCOCK (MISCELLANEOUS) ×3 IMPLANT
TUBING CIL FLEX 10 FLL-RA (TUBING) ×3 IMPLANT
WIRE ASAHI PROWATER 180CM (WIRE) ×3 IMPLANT

## 2016-05-18 NOTE — Progress Notes (Signed)
S/W TABATHA @ [employer] # [phone]    BRILINTA 90 MG BID   COVER- YES  CO-PAY- $ 85.00  PRIOR APPROVAL- NO  PHARMACY : [place]

## 2016-05-18 NOTE — Progress Notes (Signed)
TRBAND REMOVAL  LOCATION:    right radial  DEFLATED PER PROTOCOL:    Yes.    TIME BAND OFF / DRESSING APPLIED:    1245   SITE UPON ARRIVAL:    Level 0  SITE AFTER BAND REMOVAL:    Level 0  CIRCULATION SENSATION AND MOVEMENT:    Within Normal Limits   Yes.    COMMENTS:   Tolerated procedure well 

## 2016-05-18 NOTE — Care Management Note (Signed)
Case Management Note  Patient Details  Name: Stephen Arnold MRN: [id number] Date of Birth: 1965-06-19  Subjective/Objective:   S/p coronary stent intervention, will be on brilinta, NCM gave patient the brilinta 30 day savings card and he states he will be dc tomorrow and he wiill go to [facility] outpatient pharmacy to pick up 30 day free.  NCM also informed him of his co pay amount.  Patient has no other needs.                 Action/Plan:   Expected Discharge Date:                  Expected Discharge Plan:  Home/Self Care  In-House Referral:     Discharge planning Services  CM Consult  Post Acute Care Choice:    Choice offered to:     DME Arranged:    DME Agency:     HH Arranged:    HH Agency:     Status of Service:  In process, will continue to follow  If discussed at Long Length of Stay Meetings, dates discussed:    Additional Comments:  Zenon Mayo, RN 05/18/2016, 2:50 PM

## 2016-05-18 NOTE — Interval H&P Note (Signed)
History and Physical Interval Note:  05/18/2016 7:42 AM  Stephen Arnold  has presented today for surgery, with the diagnosis of cp  The various methods of treatment have been discussed with the patient and family. After consideration of risks, benefits and other options for treatment, the patient has consented to  Procedure(s): Left Heart Cath and Coronary Angiography (N/A) as a surgical intervention .  The patient's history has been reviewed, patient examined, no change in status, stable for surgery.  I have reviewed the patient's chart and labs.  Questions were answered to the patient's satisfaction.    Cath Lab Visit (complete for each Cath Lab visit)  Clinical Evaluation Leading to the Procedure:   ACS: Yes.    Non-ACS:    Anginal Classification: CCS III  Anti-ischemic medical therapy: No Therapy  Non-Invasive Test Results: Intermediate-risk stress test findings: cardiac mortality 1-3%/year  Prior CABG: No previous CABG       [name] 05/18/2016 7:42 AM

## 2016-05-19 DIAGNOSIS — I200 Unstable angina: Secondary | ICD-10-CM

## 2016-05-19 DIAGNOSIS — Z794 Long term (current) use of insulin: Secondary | ICD-10-CM | POA: Diagnosis not present

## 2016-05-19 DIAGNOSIS — I2510 Atherosclerotic heart disease of native coronary artery without angina pectoris: Secondary | ICD-10-CM | POA: Diagnosis not present

## 2016-05-19 DIAGNOSIS — E782 Mixed hyperlipidemia: Secondary | ICD-10-CM | POA: Diagnosis not present

## 2016-05-19 DIAGNOSIS — M1990 Unspecified osteoarthritis, unspecified site: Secondary | ICD-10-CM | POA: Diagnosis not present

## 2016-05-19 DIAGNOSIS — E119 Type 2 diabetes mellitus without complications: Secondary | ICD-10-CM | POA: Diagnosis not present

## 2016-05-19 DIAGNOSIS — R079 Chest pain, unspecified: Secondary | ICD-10-CM | POA: Diagnosis not present

## 2016-05-19 DIAGNOSIS — I25110 Atherosclerotic heart disease of native coronary artery with unstable angina pectoris: Secondary | ICD-10-CM | POA: Diagnosis not present

## 2016-05-19 DIAGNOSIS — I10 Essential (primary) hypertension: Secondary | ICD-10-CM | POA: Diagnosis not present

## 2016-05-19 DIAGNOSIS — F419 Anxiety disorder, unspecified: Secondary | ICD-10-CM | POA: Diagnosis not present

## 2016-05-19 DIAGNOSIS — E785 Hyperlipidemia, unspecified: Secondary | ICD-10-CM | POA: Diagnosis not present

## 2016-05-19 DIAGNOSIS — I2583 Coronary atherosclerosis due to lipid rich plaque: Secondary | ICD-10-CM

## 2016-05-19 DIAGNOSIS — Z789 Other specified health status: Secondary | ICD-10-CM

## 2016-05-19 DIAGNOSIS — Z87820 Personal history of traumatic brain injury: Secondary | ICD-10-CM | POA: Diagnosis not present

## 2016-05-19 DIAGNOSIS — Z8249 Family history of ischemic heart disease and other diseases of the circulatory system: Secondary | ICD-10-CM | POA: Diagnosis not present

## 2016-05-19 LAB — BASIC METABOLIC PANEL
Anion gap: 10 (ref 5–15)
BUN: 13 mg/dL (ref 6–20)
CALCIUM: 9.3 mg/dL (ref 8.9–10.3)
CHLORIDE: 101 mmol/L (ref 101–111)
CO2: 27 mmol/L (ref 22–32)
CREATININE: 0.79 mg/dL (ref 0.61–1.24)
Glucose, Bld: 72 mg/dL (ref 65–99)
Potassium: 3.2 mmol/L — ABNORMAL LOW (ref 3.5–5.1)
SODIUM: 138 mmol/L (ref 135–145)

## 2016-05-19 LAB — CBC
HCT: 39.9 % (ref 39.0–52.0)
Hemoglobin: 13.1 g/dL (ref 13.0–17.0)
MCH: 27.9 pg (ref 26.0–34.0)
MCHC: 32.8 g/dL (ref 30.0–36.0)
MCV: 84.9 fL (ref 78.0–100.0)
PLATELETS: 378 10*3/uL (ref 150–400)
RBC: 4.7 MIL/uL (ref 4.22–5.81)
RDW: 12.5 % (ref 11.5–15.5)
WBC: 13.3 10*3/uL — AB (ref 4.0–10.5)

## 2016-05-19 LAB — GLUCOSE, CAPILLARY: GLUCOSE-CAPILLARY: 176 mg/dL — AB (ref 65–99)

## 2016-05-19 MED ORDER — NITROGLYCERIN 0.4 MG SL SUBL: 0.4000 mg | SUBLINGUAL_TABLET | SUBLINGUAL | 12 refills | Status: DC | PRN

## 2016-05-19 MED ORDER — METOPROLOL SUCCINATE ER 25 MG PO TB24: 25.0000 mg | ORAL_TABLET | Freq: Every day | ORAL | 6 refills | Status: DC

## 2016-05-19 MED ORDER — ROSUVASTATIN CALCIUM 5 MG PO TABS: 5.0000 mg | ORAL_TABLET | ORAL | 11 refills | Status: DC

## 2016-05-19 MED ORDER — TICAGRELOR 90 MG PO TABS: 90.0000 mg | ORAL_TABLET | Freq: Two times a day (BID) | ORAL | 3 refills | Status: DC

## 2016-05-19 MED ORDER — ASPIRIN 81 MG PO TBEC: 81.0000 mg | DELAYED_RELEASE_TABLET | Freq: Every day | ORAL | 11 refills | Status: AC

## 2016-05-19 MED FILL — NITROGLYCERIN 0.4 MG TAB SL: 0.4 | 7 days supply | Qty: 25 | Fill #0

## 2016-05-19 MED FILL — ASPIR-LOW EC 81 MG TABLET: 81 | 30 days supply | Qty: 30 | Fill #0

## 2016-05-19 MED FILL — BRILINTA 90 MG TABLET: 90 | 30 days supply | Qty: 60 | Fill #0

## 2016-05-19 MED FILL — ROSUVASTATIN CALCIUM 5 MG T: 5 | 30 days supply | Qty: 15 | Fill #0

## 2016-05-19 MED FILL — METOPROLOL SUCC ER 25 MG TA: 25 | 30 days supply | Qty: 30 | Fill #0

## 2016-05-19 NOTE — Progress Notes (Signed)
Progress Note  Patient Name: Stephen Arnold Date of Encounter: 05/19/2016  Primary Cardiologist: Dr. Harrington Challenger  Subjective   Feeling well. No chest pain, sob or palpitations.   Inpatient Medications    Scheduled Meds: . aspirin EC  81 mg Oral Daily  . insulin aspart  18 Units Subcutaneous TID WC  . insulin glargine  30 Units Subcutaneous QHS  . metoprolol succinate  25 mg Oral Daily  . rosuvastatin  5 mg Oral q1800  . sodium chloride flush  3 mL Intravenous Q12H  . sodium chloride flush  3 mL Intravenous Q12H  . ticagrelor  90 mg Oral BID   Continuous Infusions:  PRN Meds: sodium chloride, acetaminophen, HYDROcodone-acetaminophen, nitroGLYCERIN, ondansetron (ZOFRAN) IV, sodium chloride flush, sodium chloride flush   Vital Signs    Vitals:   05/18/16 2000 05/18/16 2016 05/19/16 0636 05/19/16 0813  BP: (!) 154/85 (!) 154/85 120/69   Pulse:  71 70   Resp: 14 15 10 17   Temp:  98.9 F (37.2 C) 98.6 F (37 C) 97.3 F (36.3 C)  TempSrc:  Oral Oral Oral  SpO2: 97% 97% 98%   Weight:   233 lb 11 oz (106 kg)   Height:        Intake/Output Summary (Last 24 hours) at 05/19/16 0819 Last data filed at 05/19/16 0813  Gross per 24 hour  Intake           1717.5 ml  Output             4000 ml  Net          -2282.5 ml   Filed Weights   05/18/16 0500 05/18/16 0934 05/19/16 0636  Weight: 231 lb 8 oz (105 kg) 232 lb 4 oz (105.3 kg) 233 lb 11 oz (106 kg)    Telemetry    NSR - Personally Reviewed  ECG    NSR - Personally Reviewed  Physical Exam   GEN: No acute distress.   Neck: No JVD Cardiac: RRR, no murmurs, rubs, or gallops. R radial cath site without hematoma.  Respiratory: Clear to auscultation bilaterally. GI: Soft, nontender, non-distended  MS: No edema; No deformity. Neuro:  Nonfocal  Psych: Normal affect   Labs    Chemistry Recent Labs Lab 05/17/16 1454 05/18/16 0440 05/19/16 0157  NA 140 142 138  K 3.9 3.5 3.2*  CL 101 106 101  CO2 27 27 27     GLUCOSE 138* 62* 72  BUN 12 12 13   CREATININE 0.75 0.77 0.79  CALCIUM 9.6 9.2 9.3  PROT 7.2  --   --   ALBUMIN 3.9  --   --   AST 16  --   --   ALT 17  --   --   ALKPHOS 47  --   --   BILITOT 0.5  --   --   GFRNONAA >60 >60 >60  GFRAA >60 >60 >60  ANIONGAP 12 9 10      Hematology Recent Labs Lab 05/17/16 1454 05/19/16 0157  WBC 9.0 13.3*  RBC 4.72 4.70  HGB 13.2 13.1  HCT 39.4 39.9  MCV 83.5 84.9  MCH 28.0 27.9  MCHC 33.5 32.8  RDW 12.4 12.5  PLT 363 378    Cardiac EnzymesNo results for input(s): TROPONINI in the last 168 hours. No results for input(s): TROPIPOC in the last 168 hours.   BNPNo results for input(s): BNP, PROBNP in the last 168 hours.   DDimer No results for input(s):  DDIMER in the last 168 hours.   Radiology    No results found.  Cardiac Studies   Peter M Martinique, MD (Primary)    Procedures   Coronary Stent Intervention  Left Heart Cath and Coronary Angiography  Conclusion     Mid RCA lesion, 60 %stenosed.  Ost LAD to Mid LAD lesion, 30 %stenosed.  Dist LAD-1 lesion, 40 %stenosed.  Dist LAD-2 lesion, 90 %stenosed.  1st Mrg lesion, 90 %stenosed.  2nd Mrg lesion, 40 %stenosed.  LPDA-1 lesion, 45 %stenosed.  LV end diastolic pressure is normal.  Mid LAD lesion, 90 %stenosed.  A STENT PROMUS PREM MR 3.0X16 drug eluting stent was successfully placed.  Post intervention, there is a 0% residual stenosis.  LPDA-2 lesion, 90 %stenosed.  A STENT PROMUS PREM MR 2.25X20 drug eluting stent was successfully placed.  Post intervention, there is a 0% residual stenosis.   1. Multivessel obstructive CAD    - 90% ulcerated mid LAD stenosis. 90% distal LAD stenosis at the apex    - 90% small OM 1    - 90% mid left PDA 2. Normal LVEDP 3. Successful stenting of the mid LAD with DES 4. Successful stenting of the mid left PDA with DES  Plan: DAPT for one year. Aggressive risk factor modification. Patient reports history of statin  intolerance. Consider low dose Crestor 5 mg every other day. Consider adding Zetia or PCSK-9 inhibitor. Add beta blocker therapy. Would treat residual disease medically.     Patient Profile     51 y.o. male with hx of HTN, HLD and DM who presented for scheduled cath due to abnormal myoview.   Assessment & Plan    1. CAD - cath showed multivessel obstructive CAD s/p DES to mLAD and left PDA. Plan to treat residual disease medically. Continue ASA, Brilinta, Crestor, and Toprol XL.   2. HLD - 05/18/2016: Cholesterol 195; HDL 37; LDL Cholesterol 131; Triglycerides 134; VLDL 27  - LDL goal less than 70. Previously intolerance to lipitor and Crestor. Low dose Crestor started this admission. IF intolerance, will refer to lipid clinic. Consider adding Zetia.   3. HTN - Stable. BB added this admission.   4. DM - 11.2 on 02/2016. Continue home regimens.   Signed, Leanor Kail, PA  05/19/2016, 8:19 AM    The patient was seen, examined and discussed with Bhagat,Bhavinkumar PA-C and I agree with the above.   51 year old male with DM, HTN, HLP post cath yesterday for unstable angina, cath showed multivessel obstructive CAD s/p DES to mLAD and left PDA. Plan to treat residual disease medically. Continue ASA, Brilinta, Crestor, and Toprol XL. He is intolerant to statins, we will start low dose crestor 5 mg po 3x/week and refer to the lipid clinic with [facility].  Discharge home today, follow up within 7-10 days/  Ena Dawley, MD 05/19/2016

## 2016-05-19 NOTE — Care Management Note (Signed)
Case Management Note  Patient Details  Name: Stephen Arnold MRN: [id number] Date of Birth: 24-Jan-1966  Subjective/Objective:     S/p coronary stent intervention, will be on brilinta, NCM gave patient the brilinta 30 day savings card and he states he will be dc tomorrow and he wiill go to [facility] outpatient pharmacy to pick up 30 day free.  NCM also informed him of his co pay amount.  Patient has no other needs.                              Action/Plan:   Expected Discharge Date:  05/19/16               Expected Discharge Plan:  Home/Self Care  In-House Referral:     Discharge planning Services  CM Consult  Post Acute Care Choice:    Choice offered to:     DME Arranged:    DME Agency:     HH Arranged:    HH Agency:     Status of Service:  Completed, signed off  If discussed at [employer] of Stay Meetings, dates discussed:    Additional Comments:  Zenon Mayo, RN 05/19/2016, 9:19 AM

## 2016-05-19 NOTE — Discharge Summary (Signed)
Discharge Summary    Patient ID: Stephen Arnold,  MRN: [id number], DOB/AGE: [birth year] 51 y.o.  Admit date: 05/17/2016 Discharge date: 05/19/2016  Primary Care Provider: [facility] Primary Cardiologist: Dr. Harrington Challenger  Discharge Diagnoses    Active Problems:   Chest pain   Unstable angina ([place])   CAD   HTN   HLD   DM  Allergies Allergies  Allergen Reactions  . Mushroom Extract Complex Hives and Swelling    "Mushrooms"  . Lipitor [Atorvastatin]     Muscle ache    Diagnostic Studies/Procedures    Peter M Martinique, MD (Primary)    Procedures   Coronary Stent Intervention  Left Heart Cath and Coronary Angiography  Conclusion     Mid RCA lesion, 60 %stenosed.  Ost LAD to Mid LAD lesion, 30 %stenosed.  Dist LAD-1 lesion, 40 %stenosed.  Dist LAD-2 lesion, 90 %stenosed.  1st Mrg lesion, 90 %stenosed.  2nd Mrg lesion, 40 %stenosed.  LPDA-1 lesion, 45 %stenosed.  LV end diastolic pressure is normal.  Mid LAD lesion, 90 %stenosed.  A STENT PROMUS PREM MR 3.0X16 drug eluting stent was successfully placed.  Post intervention, there is a 0% residual stenosis.  LPDA-2 lesion, 90 %stenosed.  A STENT PROMUS PREM MR 2.25X20 drug eluting stent was successfully placed.  Post intervention, there is a 0% residual stenosis.  1. Multivessel obstructive CAD - 90% ulcerated mid LAD stenosis. 90% distal LAD stenosis at the apex - 90% small OM 1 - 90% mid left PDA 2. Normal LVEDP 3. Successful stenting of the mid LAD with DES 4. Successful stenting of the mid left PDA with DES  Plan: DAPT for one year. Aggressive risk factor modification. Patient reports history of statin intolerance. Consider low dose Crestor 5 mg every other day. Consider adding Zetia or PCSK-9 inhibitor. Add beta blocker therapy. Would treat residual disease medically.       History of Present Illness     51 y.o. male with hx of HTN, HLD and DM who presented for scheduled  cath due to abnormal myoview.   Recently noted exertional chest pain with ? Radiation. Seen by Dr. Harrington Challenger 05/17/16 and follow up Myoview showed anteroseptal, inferoseptal, distal anterior, distl anterolateral ischemia.  Inferior inferolateral defect that showe incomplet iprovement  ? Some diaphragm attenuation with possible ischemi   There was mild TID during study.   Hospital Course     Consultants: None  1. CAD - Cath showed multivessel obstructive CAD s/p DES to mLAD and left PDA. Plan to treat residual disease medically. Continue ASA, Brilinta, Crestor, and Toprol XL.   2. HLD - 05/18/2016: Cholesterol 195; HDL 37; LDL Cholesterol 131; Triglycerides 134; VLDL 27  - LDL goal less than 70. Previously intolerance to lipitor and Crestor. Low dose Crestor every other started this admission. Referred to lipid clinic  3. HTN - Stable. BB added this admission.   4. DM - 11.2 on 02/2016. Continue home regimens. Need tight controlled. Follow up with PCP.   The patient has been seen by Dr. Meda Coffee today and deemed ready for discharge home. All follow-up appointments have been scheduled. Discharge medications are listed below.  _____________   Discharge Vitals Blood pressure 120/69, pulse 70, temperature 97.3 F (36.3 C), temperature source Oral, resp. rate 17, height 6' (1.829 m), weight 233 lb 11 oz (106 kg), SpO2 98 %.  Filed Weights   05/18/16 0500 05/18/16 0934 05/19/16 0636  Weight: 231 lb 8 oz (105 kg) 232  lb 4 oz (105.3 kg) 233 lb 11 oz (106 kg)    Labs & Radiologic Studies     CBC  Recent Labs  05/17/16 1454 05/19/16 0157  WBC 9.0 13.3*  NEUTROABS 5.6  --   HGB 13.2 13.1  HCT 39.4 39.9  MCV 83.5 84.9  PLT 363 [id number]   Basic Metabolic Panel  Recent Labs  05/18/16 0440 05/19/16 0157  NA 142 138  K 3.5 3.2*  CL 106 101  CO2 27 27  GLUCOSE 62* 72  BUN 12 13  CREATININE 0.77 0.79  CALCIUM 9.2 9.3   Liver Function Tests  Recent Labs  05/17/16 1454  AST 16    ALT 17  ALKPHOS 47  BILITOT 0.5  PROT 7.2  ALBUMIN 3.9   No results for input(s): LIPASE, AMYLASE in the last 72 hours. Cardiac Enzymes No results for input(s): CKTOTAL, CKMB, CKMBINDEX, TROPONINI in the last 72 hours. BNP Invalid input(s): POCBNP D-Dimer No results for input(s): DDIMER in the last 72 hours. Hemoglobin A1C No results for input(s): HGBA1C in the last 72 hours. Fasting Lipid Panel  Recent Labs  05/18/16 0440  CHOL 195  HDL 37*  LDLCALC 131*  TRIG 134  CHOLHDL 5.3   Thyroid Function Tests  Recent Labs  05/17/16 1454  TSH 1.310    US Abdomen Complete  Result Date: 05/14/2016 CLINICAL DATA:  Epigastric pain, nausea, family history of gallbladder disease EXAM: ABDOMEN ULTRASOUND COMPLETE COMPARISON:  None FINDINGS: Gallbladder: Normally distended without stones or wall thickening. No pericholecystic fluid or sonographic Murphy sign. Common bile duct: Diameter: 5 mm diameter, normal Liver: Minimally increased hepatic echogenicity question fatty infiltration. Smooth hepatic contours without mass or nodularity. Hepatopetal portal venous flow. IVC: Normal appearance Pancreas: Normal appearance Spleen: Normal appearance, 7.0 cm length Right Kidney: Length: 12.6 cm. Normal morphology without mass or hydronephrosis. Left Kidney: Length: 13.7 cm. Normal morphology without mass or hydronephrosis. Abdominal aorta: Normal caliber Other findings: No free fluid IMPRESSION: Suspect minimal fatty infiltration of liver. Otherwise negative exam. Electronically Signed   By: Lavonia Dana M.D.   On: 05/14/2016 14:19    Disposition   Pt is being discharged home today in good condition.  Follow-up Plans & Appointments    Follow-up Information    [place], [place]. Go on 06/02/2016.   Specialty:  Cardiology Why:  @11 :30  for post hospital with me @10 :30 in lipid clinic   Please arrive by 10:20am Contact information: [address]  [phone]          Discharge Instructions    AMB Referral to Cardiac Rehabilitation - Phase II    Complete by:  As directed    Diagnosis:  Coronary Stents   Diet - low sodium heart healthy    Complete by:  As directed    Discharge instructions    Complete by:  As directed    No driving for 48 hours. No lifting over 5 lbs for 1 week. No sexual activity for 1 week. You may return to work on 05/24/16. Keep procedure site clean & dry. If you notice increased pain, swelling, bleeding or pus, call/return!  You may shower, but no soaking baths/hot tubs/pools for 1 week.   Increase activity slowly    Complete by:  As directed       Discharge Medications   Current Discharge Medication List    START taking these medications   Details  aspirin EC 81 MG EC  tablet Take 1 tablet (81 mg total) by mouth daily. Qty: 30 tablet, Refills: 11    metoprolol succinate (TOPROL-XL) 25 MG 24 hr tablet Take 1 tablet (25 mg total) by mouth daily. Qty: 30 tablet, Refills: 6    nitroGLYCERIN (NITROSTAT) 0.4 MG SL tablet Place 1 tablet (0.4 mg total) under the tongue every 5 (five) minutes x 3 doses as needed for chest pain. Qty: 25 tablet, Refills: 12    rosuvastatin (CRESTOR) 5 MG tablet Take 1 tablet (5 mg total) by mouth every other day. Take Tuesday, Thursday and saturday Qty: 30 tablet, Refills: 11    ticagrelor (BRILINTA) 90 MG TABS tablet Take 1 tablet (90 mg total) by mouth 2 (two) times daily. Qty: 180 tablet, Refills: 3      CONTINUE these medications which have NOT CHANGED   Details  acetaminophen-codeine (TYLENOL #3) 300-30 MG per tablet Take 1 tablet by mouth every 4 (four) hours as needed for moderate pain.    Insulin Glargine (TOUJEO SOLOSTAR [place]) Inject 65 Units into the skin every evening.     insulin lispro (HUMALOG) 100 UNIT/ML injection Inject 18-25 Units into the skin 3 (three) times daily before meals. Per sliding scale    loratadine (CLARITIN) 10 MG tablet Take 10  mg by mouth daily as needed for allergies.           Outstanding Labs/Studies   None  Duration of Discharge Encounter   Greater than 30 minutes including physician time.  Signed, Demondre Aguas PA-C 05/19/2016, 8:55 AM

## 2016-05-19 NOTE — Progress Notes (Signed)
CARDIAC REHAB PHASE I   PRE:  Rate/Rhythm: 96 SR  BP:  Sitting: 161/99        SaO2: 100 RA  MODE:  Ambulation: 800 ft   POST:  Rate/Rhythm: 96 SR  BP:  Sitting: 176/98         SaO2: 100 RA  Pt ambulated 800 ft on RA, independent, steady gait, tolerated well with no complaints. Completed PCI/stent education.  Reviewed risk factors, anti-platelet therapy, stent card, activity restrictions, ntg, exercise, heart healthy diet, carb counting, portion control and phase 2 cardiac rehab. Pt verbalized understanding, receptive to education. Pt agrees to phase 2 cardiac rehab referral, will send to [facility] per pt request. Pt to recliner after walk, call bell within reach.  [id number] Lenna Sciara, RN, BSN 05/19/2016 9:02 AM

## 2016-05-20 ENCOUNTER — Telehealth: Payer: Self-pay | Admitting: Internal Medicine

## 2016-05-20 NOTE — Telephone Encounter (Signed)
New Message     Needs a letter on [name] stating that her husband was admitted under Dr Harrington Challenger care and could not fly.  Can you fax or email this to them asap.

## 2016-05-20 NOTE — Telephone Encounter (Signed)
Called patient's wife back.  Informed I will fax letter.  Per her request, to her work fax [phone].

## 2016-05-24 MED FILL — ACETAMINOPHEN/COD #3 TABLET: 300-30 | 25 days supply | Qty: 25 | Fill #0

## 2016-05-25 DIAGNOSIS — E1165 Type 2 diabetes mellitus with hyperglycemia: Secondary | ICD-10-CM | POA: Diagnosis not present

## 2016-05-25 DIAGNOSIS — E785 Hyperlipidemia, unspecified: Secondary | ICD-10-CM | POA: Diagnosis not present

## 2016-05-27 ENCOUNTER — Encounter: Payer: Self-pay | Admitting: Physician Assistant
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
## 2016-06-01 DIAGNOSIS — E1165 Type 2 diabetes mellitus with hyperglycemia: Secondary | ICD-10-CM | POA: Diagnosis not present

## 2016-06-02 ENCOUNTER — Encounter (INDEPENDENT_AMBULATORY_CARE_PROVIDER_SITE_OTHER): Payer: Self-pay

## 2016-06-02 ENCOUNTER — Ambulatory Visit (INDEPENDENT_AMBULATORY_CARE_PROVIDER_SITE_OTHER): Payer: 59 | Admitting: Physician Assistant

## 2016-06-02 ENCOUNTER — Encounter: Payer: Self-pay | Admitting: Pharmacist

## 2016-06-02 ENCOUNTER — Encounter: Payer: Self-pay | Admitting: Physician Assistant

## 2016-06-02 ENCOUNTER — Ambulatory Visit (INDEPENDENT_AMBULATORY_CARE_PROVIDER_SITE_OTHER): Payer: 59 | Admitting: Pharmacist

## 2016-06-02 VITALS — BP 122/72 | HR 92 | Ht 72.0 in | Wt 239.1 lb

## 2016-06-02 DIAGNOSIS — E785 Hyperlipidemia, unspecified: Secondary | ICD-10-CM

## 2016-06-02 DIAGNOSIS — I10 Essential (primary) hypertension: Secondary | ICD-10-CM

## 2016-06-02 DIAGNOSIS — I2510 Atherosclerotic heart disease of native coronary artery without angina pectoris: Secondary | ICD-10-CM

## 2016-06-02 DIAGNOSIS — E7800 Pure hypercholesterolemia, unspecified: Secondary | ICD-10-CM

## 2016-06-02 HISTORY — DX: Hyperlipidemia, unspecified: E78.5

## 2016-06-02 MED ORDER — METOPROLOL SUCCINATE ER 25 MG PO TB24: 12.5000 mg | ORAL_TABLET | Freq: Every day | ORAL | 6 refills | Status: DC
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
## 2016-06-02 NOTE — Patient Instructions (Addendum)
Your physician has recommended you make the following change in your medication:  DECREASE  METOPROLOL TO  12. 5 MG  DAILY   Your physician recommends that you schedule a follow-up appointment in:  [place]

## 2016-06-02 NOTE — Patient Instructions (Signed)
We will submit your information for coverage of Repatha injections.    Cholesterol Cholesterol is a white, waxy, fat-like substance that is needed by the human body in small amounts. The liver makes all the cholesterol we need. Cholesterol is carried from the liver by the blood through the blood vessels. Deposits of cholesterol (plaques) may build up on blood vessel (artery) walls. Plaques make the arteries narrower and stiffer. Cholesterol plaques increase the risk for heart attack and stroke. You cannot feel your cholesterol level even if it is very high. The only way to know that it is high is to have a blood test. Once you know your cholesterol levels, you should keep a record of the test results. Work with your health care provider to keep your levels in the desired range. What do the results mean?  Total cholesterol is a rough measure of all the cholesterol in your blood.  LDL (low-density lipoprotein) is the "bad" cholesterol. This is the type that causes plaque to build up on the artery walls. You want this level to be low.  HDL (high-density lipoprotein) is the "good" cholesterol because it cleans the arteries and carries the LDL away. You want this level to be high.  Triglycerides are fat that the body can either burn for energy or store. High levels are closely linked to heart disease. What are the desired levels of cholesterol?  Total cholesterol below 200.  LDL below 100 for people who are at risk, below 70 for people at very high risk.  HDL above 40 is good. A level of 60 or higher is considered to be protective against heart disease.  Triglycerides below 150. How can I lower my cholesterol? Diet  Follow your diet program as told by your health care provider.  Choose fish or white meat chicken and [place], roasted or baked. Limit fatty cuts of red meat, fried foods, and processed meats, such as sausage and lunch meats.  Eat lots of fresh fruits and vegetables.  Choose  whole grains, beans, pasta, potatoes, and cereals.  Choose olive oil, corn oil, or canola oil, and use only small amounts.  Avoid butter, mayonnaise, shortening, or palm kernel oils.  Avoid foods with trans fats.  Drink skim or nonfat milk and eat low-fat or nonfat yogurt and cheeses. Avoid whole milk, cream, ice cream, egg yolks, and full-fat cheeses.  Healthier desserts include angel food cake, ginger snaps, animal crackers, hard candy, popsicles, and low-fat or nonfat frozen yogurt. Avoid pastries, cakes, pies, and cookies. Exercise  Follow your exercise program as told by your health care provider. A regular program:  Helps to decrease LDL and raise HDL.  Helps with weight control.  Do things that increase your activity level, such as gardening, walking, and taking the stairs.  Ask your health care provider about ways that you can be more active in your daily life. Medicine  Take over-the-counter and prescription medicines only as told by your health care provider.  Medicine may be prescribed by your health care provider to help lower cholesterol and decrease the risk for heart disease. This is usually done if diet and exercise have failed to bring down cholesterol levels.  If you have several risk factors, you may need medicine even if your levels are normal. This information is not intended to replace advice given to you by your health care provider. Make sure you discuss any questions you have with your health care provider. Document Released: 12/29/2000 Document Revised: 11/01/2015 Document Reviewed:  10/04/2015 Elsevier Interactive Patient Education  2017 [employer].

## 2016-06-07 ENCOUNTER — Telehealth: Payer: Self-pay | Admitting: Internal Medicine

## 2016-06-07 DIAGNOSIS — E785 Hyperlipidemia, unspecified: Secondary | ICD-10-CM

## 2016-06-07 DIAGNOSIS — M65312 Trigger thumb, left thumb: Secondary | ICD-10-CM | POA: Diagnosis not present

## 2016-06-07 DIAGNOSIS — M65311 Trigger thumb, right thumb: Secondary | ICD-10-CM | POA: Diagnosis not present

## 2016-06-07 MED FILL — ACETAMINOPHEN/COD #3 TABLET: 300-30 | 10 days supply | Qty: 30 | Fill #0

## 2016-06-07 NOTE — Telephone Encounter (Signed)
MESSAGE  FORWARDED TO  KELLEY AUTEN  [place]

## 2016-06-07 NOTE — Telephone Encounter (Signed)
New message   Pt wife verbalized that pt was suppose to start prescription   Ropatha mg unknown, injectable medication.

## 2016-06-08 NOTE — Telephone Encounter (Signed)
Pt's wife Sigifredo Locicero called back - looking for an update on her husband's Repatha. Advised her that prior Josem Kaufmann has been sent to his insurance and we are waiting on a response. She would like a call on her cell [phone] once we hear back from insurance. Pt would like to use [facility] to fill his rx.

## 2016-06-08 NOTE — Telephone Encounter (Signed)
LM at wife's work. Still awaiting response on PA for Repatha. Will call insurance at earliest convenience today for update.

## 2016-06-08 NOTE — Telephone Encounter (Signed)
F/U CALL:  Stephen Arnold is calling back in regards to [place] prescription. Thanks.

## 2016-06-09 NOTE — Telephone Encounter (Signed)
Received additional request from [employer] for trial on Zetia. Called number provided [phone] reference code [phone] and left confidential VM on line.   Explained that patient has not done trial on Zetia, but he is unable to tolerate statin therapy and at this time there is no evidence for Zetia therapy alone (as indicated in note sent with initial PA) and that Zetia therapy likely would not get LDL to goal <70.   Will await response.   LM to update wife with above.

## 2016-06-11 MED FILL — BD PEN NDL NANO 32GX5/32: 32G X 4 MM | 28 days supply | Qty: 200 | Fill #2

## 2016-06-11 MED FILL — BD PEN NDL NANO 32GX5/32": 32G X 4 MM | 28 days supply | Qty: 200 | Fill #2

## 2016-06-14 ENCOUNTER — Ambulatory Visit: Payer: 59 | Admitting: Internal Medicine

## 2016-06-17 ENCOUNTER — Telehealth (HOSPITAL_COMMUNITY): Payer: Self-pay | Admitting: Internal Medicine

## 2016-06-17 NOTE — Telephone Encounter (Signed)
Verified UMR through [employer], tried calling UMR no one would pick up got automated service, UMR is not under Passport.  Pt has no Co-Pay, Co-Insurance 20%, Deductible $2700.00 which pt has met all of it. Out of Pocket $ 7000.00 which pt has met $3293.64,  Pt's responsibility is $3706.36.... KJ

## 2016-06-18 MED ORDER — EVOLOCUMAB 140 MG/ML ~~LOC~~ SOAJ: 140.0000 mg | SUBCUTANEOUS | 5 refills | Status: DC

## 2016-06-18 MED FILL — ASPIR-LOW EC 81 MG TABLET: 81 | 30 days supply | Qty: 30 | Fill #1

## 2016-06-18 MED FILL — METOPROLOL SUCC ER 25 MG TA: 25 | 30 days supply | Qty: 30 | Fill #1 | Status: TO

## 2016-06-18 MED FILL — BRILINTA 90 MG TABLET: 90 | 30 days supply | Qty: 60 | Fill #1 | Status: TO

## 2016-06-18 NOTE — Telephone Encounter (Signed)
LM that repatha approved for 12 weeks. Will need to call Monday to schedule labs based on pt first dose. Pt already given copay card.

## 2016-06-18 NOTE — Addendum Note (Signed)
Addended by: Erskine Emery on: 06/18/2016 05:29 PM   Modules accepted: Orders

## 2016-06-21 MED FILL — REPATHA 140 MG/1 ML INJ: 140 | 28 days supply | Qty: 2 | Fill #0

## 2016-06-21 MED FILL — ACETAMINOPHEN/COD #3 TABLET: 300-30 | 10 days supply | Qty: 30 | Fill #0

## 2016-06-22 NOTE — Telephone Encounter (Signed)
Pt's wife returned call to clinic. States Repatha will be delivered to [facility] outpatient pharmacy tomorrow 3/7. Pt is comfortable with injections at home since he takes insulin as well. Scheduled follow up labs on same day as OV with Dr Harrington Challenger in May.

## 2016-06-22 NOTE — Addendum Note (Signed)
Addended by: SUPPLE, MEGAN E on: 06/22/2016 02:07 PM   Modules accepted: Orders

## 2016-06-22 NOTE — Telephone Encounter (Signed)
LMTCB about labs

## 2016-07-06 ENCOUNTER — Telehealth (HOSPITAL_COMMUNITY): Payer: Self-pay | Admitting: Internal Medicine

## 2016-07-06 NOTE — Telephone Encounter (Signed)
I called and spoke with patient about participating in the Cardiac Rehab program. Patient declined at this time. Referral canceled.

## 2016-07-07 DIAGNOSIS — M65311 Trigger thumb, right thumb: Secondary | ICD-10-CM | POA: Diagnosis not present

## 2016-07-07 DIAGNOSIS — M25562 Pain in left knee: Secondary | ICD-10-CM | POA: Diagnosis not present

## 2016-07-07 MED FILL — HUMALOG 100 UNITS/ML KWIKPE: 100 | 50 days supply | Qty: 30 | Fill #2

## 2016-07-08 MED FILL — ACETAMINOPHEN/COD #3 TABLET: 300-30 | 5 days supply | Qty: 20 | Fill #0

## 2016-07-20 MED FILL — BRILINTA 90 MG TABLET: 90 | 30 days supply | Qty: 60 | Fill #0 | Status: TO

## 2016-07-20 MED FILL — METOPROLOL SUCC ER 25 MG TA: 25 | 90 days supply | Qty: 90 | Fill #0

## 2016-07-20 MED FILL — ASPIR-LOW 81 MG TABLET EC: 81 | 30 days supply | Qty: 30 | Fill #2

## 2016-07-20 MED FILL — REPATHA 140 MG/1 ML INJ: 140 | 28 days supply | Qty: 2 | Fill #1

## 2016-07-29 MED FILL — ACETAMINOPHEN/COD #3 TABLET: 300-30 | 20 days supply | Qty: 20 | Fill #0

## 2016-08-19 MED FILL — BD PEN NDL NANO 32GX5/32: 32G X 4 MM | 28 days supply | Qty: 200 | Fill #3

## 2016-08-19 MED FILL — TOUJEO SOLOSTAR 300 UNITS/M: 300 | 90 days supply | Qty: 18 | Fill #1

## 2016-08-19 MED FILL — BD PEN NDL NANO 32GX5/32": 32G X 4 MM | 28 days supply | Qty: 200 | Fill #3

## 2016-08-19 MED FILL — HUMALOG 100 UNITS/ML KWIKPE: 100 | 50 days supply | Qty: 30 | Fill #3

## 2016-08-19 MED FILL — BRILINTA 90 MG TABLET: 90 | 30 days supply | Qty: 60 | Fill #0

## 2016-08-19 MED FILL — REPATHA 140 MG/1 ML INJ: 140 | 28 days supply | Qty: 2 | Fill #2

## 2016-08-19 MED FILL — ASPIRIN EC 81 MG TABLET: 81 | 30 days supply | Qty: 30 | Fill #3

## 2016-08-24 ENCOUNTER — Encounter: Payer: Self-pay | Admitting: Internal Medicine

## 2016-08-25 DIAGNOSIS — L821 Other seborrheic keratosis: Secondary | ICD-10-CM | POA: Diagnosis not present

## 2016-08-25 DIAGNOSIS — L570 Actinic keratosis: Secondary | ICD-10-CM | POA: Diagnosis not present

## 2016-08-26 DIAGNOSIS — M65311 Trigger thumb, right thumb: Secondary | ICD-10-CM | POA: Diagnosis not present

## 2016-08-26 DIAGNOSIS — M65312 Trigger thumb, left thumb: Secondary | ICD-10-CM | POA: Diagnosis not present

## 2016-08-26 DIAGNOSIS — M25562 Pain in left knee: Secondary | ICD-10-CM | POA: Diagnosis not present

## 2016-08-26 MED FILL — ACETAMINOPHEN/COD #3 TABLET: 300-30 | 30 days supply | Qty: 30 | Fill #0

## 2016-09-01 DIAGNOSIS — L570 Actinic keratosis: Secondary | ICD-10-CM | POA: Diagnosis not present
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
## 2016-09-10 ENCOUNTER — Encounter: Payer: Self-pay | Admitting: Internal Medicine

## 2016-09-10 ENCOUNTER — Ambulatory Visit (INDEPENDENT_AMBULATORY_CARE_PROVIDER_SITE_OTHER): Payer: 59 | Admitting: Internal Medicine

## 2016-09-10 ENCOUNTER — Other Ambulatory Visit: Payer: 59 | Admitting: *Deleted

## 2016-09-10 ENCOUNTER — Encounter (INDEPENDENT_AMBULATORY_CARE_PROVIDER_SITE_OTHER): Payer: Self-pay

## 2016-09-10 VITALS — BP 132/80 | HR 70 | Ht 72.0 in | Wt 237.1 lb

## 2016-09-10 DIAGNOSIS — E785 Hyperlipidemia, unspecified: Secondary | ICD-10-CM

## 2016-09-10 DIAGNOSIS — I2510 Atherosclerotic heart disease of native coronary artery without angina pectoris: Secondary | ICD-10-CM | POA: Diagnosis not present

## 2016-09-10 DIAGNOSIS — E7800 Pure hypercholesterolemia, unspecified: Secondary | ICD-10-CM

## 2016-09-10 LAB — LIPID PANEL
CHOL/HDL RATIO: 3 ratio (ref 0.0–5.0)
Cholesterol, Total: 118 mg/dL (ref 100–199)
HDL: 39 mg/dL — ABNORMAL LOW (ref >39)
LDL CALC: 44 mg/dL (ref 0–99)
TRIGLYCERIDES: 174 mg/dL — AB (ref 0–149)
VLDL Cholesterol Cal: 35 mg/dL (ref 5–40)

## 2016-09-10 LAB — HEPATIC FUNCTION PANEL
ALT: 24 IU/L (ref 0–44)
AST: 17 IU/L (ref 0–40)
Albumin: 4.1 g/dL (ref 3.5–5.5)
Alkaline Phosphatase: 59 IU/L (ref 39–117)
BILIRUBIN TOTAL: 0.3 mg/dL (ref 0.0–1.2)
Bilirubin, Direct: 0.08 mg/dL (ref 0.00–0.40)
Total Protein: 6.6 g/dL (ref 6.0–8.5)

## 2016-09-10 MED ORDER — DICLOFENAC SODIUM 1 % TD GEL: 2.0000 g | Freq: Four times a day (QID) | TRANSDERMAL | 0 refills | Status: DC

## 2016-09-10 MED FILL — DICLOFENAC SODIUM 1% GEL: 1 | 10 days supply | Qty: 100 | Fill #0

## 2016-09-10 NOTE — Patient Instructions (Addendum)
Your physician recommends that you continue on your current medications as directed. Please refer to the Current Medication list given to you today.  Voltaren Gel 1%--apply 2 gram to affected knee as needed for arthritic pain.  Your physician recommends that you return for lab work today as scheduled.  Your physician wants you to follow-up in: 5-6 months with Dr. Harrington Challenger Marcene Duos) You will receive a reminder letter in the mail two months in advance. If you don't receive a letter, please call our office to schedule the follow-up appointment.

## 2016-09-16 DIAGNOSIS — H524 Presbyopia: Secondary | ICD-10-CM | POA: Diagnosis not present

## 2016-09-16 DIAGNOSIS — H5203 Hypermetropia, bilateral: Secondary | ICD-10-CM | POA: Diagnosis not present

## 2016-09-16 DIAGNOSIS — H52223 Regular astigmatism, bilateral: Secondary | ICD-10-CM | POA: Diagnosis not present

## 2016-09-20 MED FILL — ASPIRIN EC 81 MG TABLET: 81 | 30 days supply | Qty: 30 | Fill #4

## 2016-09-20 MED FILL — ACETAMINOPHEN/COD #3 TABLET: 300-30 | 30 days supply | Qty: 30 | Fill #0

## 2016-09-20 MED FILL — BRILINTA 90 MG TABLET: 90 | 30 days supply | Qty: 60 | Fill #1

## 2016-10-01 MED FILL — REPATHA 140 MG/1 ML INJ: 140 | 28 days supply | Qty: 2 | Fill #3

## 2016-10-18 MED FILL — HYDROCODON-APAP 7.5-325: 7.5-325 | 1 days supply | Qty: 4 | Fill #0

## 2016-10-19 MED FILL — ASPIRIN EC 81 MG TABLET: 81 | 30 days supply | Qty: 30 | Fill #5

## 2016-10-19 MED FILL — HUMALOG 100 UNITS/ML KWIKPE: 100 | 50 days supply | Qty: 30 | Fill #4

## 2016-10-19 MED FILL — OXYCODONE/APAP 5-325: 5-325 | 2 days supply | Qty: 15 | Fill #0

## 2016-10-19 MED FILL — BRILINTA 90 MG TABLET: 90 | 30 days supply | Qty: 60 | Fill #2

## 2016-10-19 MED FILL — BD PEN NDL NANO 32GX5/32: 32G X 4 MM | 28 days supply | Qty: 200 | Fill #4

## 2016-10-19 MED FILL — METOPROLOL SUCC ER 25 MG TA: 25 | 60 days supply | Qty: 60 | Fill #1

## 2016-10-19 MED FILL — BD PEN NDL NANO 32GX5/32": 32G X 4 MM | 28 days supply | Qty: 200 | Fill #4

## 2016-10-19 MED FILL — AMOXICILLIN 875 MG TABLET: 875 | 7 days supply | Qty: 14 | Fill #0

## 2016-11-08 MED FILL — REPATHA 140 MG/1 ML INJ: 140 | 28 days supply | Qty: 2 | Fill #4

## 2016-11-10 DIAGNOSIS — M25562 Pain in left knee: Secondary | ICD-10-CM | POA: Diagnosis not present

## 2016-11-10 DIAGNOSIS — M542 Cervicalgia: Secondary | ICD-10-CM | POA: Diagnosis not present

## 2016-11-10 DIAGNOSIS — M65312 Trigger thumb, left thumb: Secondary | ICD-10-CM | POA: Diagnosis not present

## 2016-11-18 MED FILL — TOUJEO SOLOSTAR 300 UNITS/M: 300 | 90 days supply | Qty: 18 | Fill #0

## 2016-11-22 DIAGNOSIS — E1165 Type 2 diabetes mellitus with hyperglycemia: Secondary | ICD-10-CM | POA: Diagnosis not present

## 2016-11-22 DIAGNOSIS — E785 Hyperlipidemia, unspecified: Secondary | ICD-10-CM | POA: Diagnosis not present

## 2016-11-22 MED FILL — BD PEN NDL NANO 32GX5/32: 32G X 4 MM | 28 days supply | Qty: 200 | Fill #5

## 2016-11-22 MED FILL — ASPIRIN EC 81 MG TABLET: 81 | 30 days supply | Qty: 30 | Fill #6

## 2016-11-22 MED FILL — BD PEN NDL NANO 32GX5/32": 32G X 4 MM | 28 days supply | Qty: 200 | Fill #5

## 2016-11-22 MED FILL — BRILINTA 90 MG TABLET: 90 | 30 days supply | Qty: 60 | Fill #3

## 2016-11-23 ENCOUNTER — Ambulatory Visit: Payer: 59 | Attending: Orthopaedic Surgery | Admitting: Physical Therapy

## 2016-11-23 DIAGNOSIS — R293 Abnormal posture: Secondary | ICD-10-CM | POA: Diagnosis not present

## 2016-11-23 DIAGNOSIS — M542 Cervicalgia: Secondary | ICD-10-CM | POA: Insufficient documentation
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
## 2016-11-23 NOTE — Patient Instructions (Addendum)
Axial Extension (Chin Tuck)   Pull chin in and lengthen back of neck. Hold __5-10__ seconds while counting out loud. Repeat __15__ times. Do _2-4___ sessions per day.  Flexibility: Upper Trapezius Stretch   Gently grasp right side of head while reaching behind back with other hand. Tilt head away until a gentle stretch is felt. Hold _30___ seconds. Repeat _3___ times per set.   Levator Stretch   Grasp seat or sit on hand on side to be stretched. Turn head toward other side and look down. Use hand on head to gently stretch neck in that position. Hold __30__ seconds. Repeat on other side. Repeat __3__ times.   Scapular Retraction (Standing)    With arms at sides, pinch shoulder blades together. Repeat __15__ times per set. Do __2-4__ sessions per day.  Trigger Point Dry Needling  . What is Trigger Point Dry Needling (DN)? o DN is a physical therapy technique used to treat muscle pain and dysfunction. Specifically, DN helps deactivate muscle trigger points (muscle knots).  o A thin filiform needle is used to penetrate the skin and stimulate the underlying trigger point. The goal is for a local twitch response (LTR) to occur and for the trigger point to relax. No medication of any kind is injected during the procedure.   . What Does Trigger Point Dry Needling Feel Like?  o The procedure feels different for each individual patient. Some patients report that they do not actually feel the needle enter the skin and overall the process is not painful. Very mild bleeding may occur. However, many patients feel a deep cramping in the muscle in which the needle was inserted. This is the local twitch response.   Marland Kitchen How Will I feel after the treatment? o Soreness is normal, and the onset of soreness may not occur for a few hours. Typically this soreness does not last longer than two days.  o Bruising is uncommon, however; ice can be used to decrease any possible bruising.  o In rare cases  feeling tired or nauseous after the treatment is normal. In addition, your symptoms may get worse before they get better, this period will typically not last longer than 24 hours.   . What Can I do After My Treatment? o Increase your hydration by drinking more water for the next 24 hours. o You may place ice or heat on the areas treated that have become sore, however, do not use heat on inflamed or bruised areas. Heat often brings more relief post needling. o You can continue your regular activities, but vigorous activity is not recommended initially after the treatment for 24 hours. o DN is best combined with other physical therapy such as strengthening, stretching, and other therapies.

## 2016-11-26 ENCOUNTER — Ambulatory Visit: Payer: 59 | Admitting: Physical Therapy

## 2016-11-26 DIAGNOSIS — R293 Abnormal posture: Secondary | ICD-10-CM | POA: Diagnosis not present

## 2016-11-26 DIAGNOSIS — M542 Cervicalgia: Secondary | ICD-10-CM
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
## 2016-11-26 NOTE — Patient Instructions (Signed)
Resistive Band Rowing    With resistive band anchored in door, grasp both ends. Keeping elbows bent, pull back, squeezing shoulder blades together. Hold __5-10__ seconds. Repeat __15__ times. Do __2__ sessions per day.   Lying on pool noodle - hands up - pull band straight across chest - squeeze shoulder blades x 15 reps  Lying on pool noodle - elbows bent and by side - rotate outwards - squeeze shoulder blades x 15 reps

## 2016-12-06 MED FILL — REPATHA 140 MG/1 ML INJ: 140 | 28 days supply | Qty: 2 | Fill #5

## 2016-12-06 MED FILL — HUMALOG 100 UNITS/ML KWIKPE: 100 | 50 days supply | Qty: 30 | Fill #5

## 2016-12-07 ENCOUNTER — Ambulatory Visit: Payer: 59 | Admitting: Physical Therapy

## 2016-12-07 ENCOUNTER — Encounter (HOSPITAL_COMMUNITY): Payer: Self-pay | Admitting: *Deleted

## 2016-12-07 DIAGNOSIS — M542 Cervicalgia: Secondary | ICD-10-CM | POA: Diagnosis not present

## 2016-12-07 DIAGNOSIS — R293 Abnormal posture: Secondary | ICD-10-CM

## 2016-12-07 NOTE — Progress Notes (Signed)
Spoke with pt for pre-op call. Pt had cardiac stents placed in January 2018. Pt is followed by Dr. Harrington Challenger. Pt denies any recent chest pain or sob. Pt states he has not been instructed by anyone whether he is to stop Brilinta and/or Aspirin prior to surgery. I told him I would call Dr. Jerald Kief office and see what they say and either I will call him back or Dr. Jerald Kief office will. He voiced understanding. Pt is a type 2 diabetic. Pt states he had an A1C a few weeks to month ago and thinks it was 6.? But is not sure. I called [facility] and spoke with medical records and they cannot find where one was done, she did explain that it might have been a point of care lab and not documented in the notes from the office visit on 11/21/16. Pt states his fasting blood sugar has been running between 75-145 recently. Pt instructed to take 1/2 of his regular dose of Toujeo Insulin Wednesday PM (will take 37 units). Instructed pt to check his blood sugar Thursday AM when he gets up and every 2 hours until he leaves for the hospital. If blood sugar is >220 take 1/2 of usual correction dose of Humalog insulin. If blood sugar is 70 or below, treat with 1/2 cup of clear juice (apple or cranberry) and recheck blood sugar 15 minutes after drinking juice. If blood sugar continues to be 70 or below, call the Short Stay department and ask to speak to a nurse. Pt voiced understanding. Merilynn Finland, PA has called and spoken with Horatio Pel at Dr. Jerald Kief office about stopping the Brilinta and/or Aspirin. Juliann Pulse will be following up on this.

## 2016-12-07 NOTE — Patient Instructions (Signed)
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
## 2016-12-08 ENCOUNTER — Telehealth: Payer: Self-pay | Admitting: Internal Medicine

## 2016-12-08 MED ORDER — CEFAZOLIN SODIUM-DEXTROSE 2-4 GM/100ML-% IV SOLN
2.0000 g | INTRAVENOUS | Status: AC
  Administered 2016-12-09: 2 g via INTRAVENOUS
  Filled 2016-12-08: qty 100
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
## 2016-12-08 NOTE — Telephone Encounter (Signed)
Will forward to Dr Harrington Challenger for review .Adonis Housekeeper

## 2016-12-08 NOTE — Telephone Encounter (Signed)
[facility] notified that pt cannot hold [place] Will fax this note to Dr Delphi office
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
## 2016-12-08 NOTE — Telephone Encounter (Signed)
He should not come off Brilinta. Just had 2 drug eluting stents done to LAD less than 1 year ago Goal is to get through 1 year on dual antiplt therapy without interruption.

## 2016-12-08 NOTE — Telephone Encounter (Signed)
New message    [facility] is calling.      [facility] Pre-operative Risk Assessment    Request for surgical clearance:  1. What type of surgery is being performed? Trigger Thumb Release under nerve block  2. When is this surgery scheduled? Tomorrow, 12/08/16    3. Are there any medications that need to be held prior to surgery and how long? Brilinta   4. Name of physician performing surgery? Peter Dalldorf   5. What is your office phone and fax number? [place] 12/08/2016, 10:27 AM  _________________________________________________________________   (provider comments below)

## 2016-12-09 ENCOUNTER — Ambulatory Visit (HOSPITAL_COMMUNITY)
Admission: RE | Admit: 2016-12-09 | Discharge: 2016-12-09 | Disposition: A | Discharge status: Home / Self Care | Payer: 59 | Source: Ambulatory Visit | Attending: Orthopaedic Surgery | Admitting: Orthopaedic Surgery

## 2016-12-09 ENCOUNTER — Encounter (HOSPITAL_BASED_OUTPATIENT_CLINIC_OR_DEPARTMENT_OTHER): Payer: Self-pay

## 2016-12-09 ENCOUNTER — Encounter (HOSPITAL_COMMUNITY): Payer: Self-pay | Admitting: *Deleted

## 2016-12-09 ENCOUNTER — Ambulatory Visit (HOSPITAL_COMMUNITY): Payer: 59 | Admitting: Vascular Surgery

## 2016-12-09 ENCOUNTER — Encounter (HOSPITAL_COMMUNITY)
Admission: RE | Disposition: A | Discharge status: Home / Self Care | Payer: Self-pay | Source: Ambulatory Visit | Attending: Orthopaedic Surgery

## 2016-12-09 ENCOUNTER — Ambulatory Visit (HOSPITAL_BASED_OUTPATIENT_CLINIC_OR_DEPARTMENT_OTHER): Admit: 2016-12-09 | Payer: 59 | Admitting: Orthopaedic Surgery

## 2016-12-09 DIAGNOSIS — I2510 Atherosclerotic heart disease of native coronary artery without angina pectoris: Secondary | ICD-10-CM | POA: Diagnosis not present

## 2016-12-09 DIAGNOSIS — I10 Essential (primary) hypertension: Secondary | ICD-10-CM | POA: Diagnosis not present

## 2016-12-09 DIAGNOSIS — E119 Type 2 diabetes mellitus without complications: Secondary | ICD-10-CM | POA: Insufficient documentation

## 2016-12-09 DIAGNOSIS — Z8249 Family history of ischemic heart disease and other diseases of the circulatory system: Secondary | ICD-10-CM | POA: Diagnosis not present

## 2016-12-09 DIAGNOSIS — Z955 Presence of coronary angioplasty implant and graft: Secondary | ICD-10-CM | POA: Diagnosis not present

## 2016-12-09 DIAGNOSIS — M1990 Unspecified osteoarthritis, unspecified site: Secondary | ICD-10-CM | POA: Insufficient documentation

## 2016-12-09 DIAGNOSIS — E785 Hyperlipidemia, unspecified: Secondary | ICD-10-CM | POA: Diagnosis not present

## 2016-12-09 DIAGNOSIS — M65312 Trigger thumb, left thumb: Secondary | ICD-10-CM | POA: Insufficient documentation

## 2016-12-09 HISTORY — DX: Essential (primary) hypertension: I10

## 2016-12-09 HISTORY — PX: TRIGGER FINGER RELEASE: SHX641

## 2016-12-09 LAB — GLUCOSE, CAPILLARY
GLUCOSE-CAPILLARY: 189 mg/dL — AB (ref 65–99)
Glucose-Capillary: 108 mg/dL — ABNORMAL HIGH (ref 65–99)
Glucose-Capillary: 56 mg/dL — ABNORMAL LOW (ref 65–99)
Glucose-Capillary: 122 mg/dL — ABNORMAL HIGH (ref 65–99)

## 2016-12-09 LAB — BASIC METABOLIC PANEL
ANION GAP: 8 (ref 5–15)
BUN: 13 mg/dL (ref 6–20)
CHLORIDE: 107 mmol/L (ref 101–111)
CO2: 25 mmol/L (ref 22–32)
CREATININE: 0.7 mg/dL (ref 0.61–1.24)
Calcium: 9.1 mg/dL (ref 8.9–10.3)
GFR calc non Af Amer: >60 mL/min (ref >60)
Glucose, Bld: 109 mg/dL — ABNORMAL HIGH (ref 65–99)
POTASSIUM: 4.1 mmol/L (ref 3.5–5.1)
SODIUM: 140 mmol/L (ref 135–145)

## 2016-12-09 LAB — CBC
HCT: 38.1 % — ABNORMAL LOW (ref 39.0–52.0)
HEMOGLOBIN: 12.6 g/dL — AB (ref 13.0–17.0)
MCH: 27.1 pg (ref 26.0–34.0)
MCHC: 33.1 g/dL (ref 30.0–36.0)
MCV: 81.9 fL (ref 78.0–100.0)
PLATELETS: 363 10*3/uL (ref 150–400)
RBC: 4.65 MIL/uL (ref 4.22–5.81)
RDW: 13 % (ref 11.5–15.5)
WBC: 7.6 10*3/uL (ref 4.0–10.5)

## 2016-12-09 SURGERY — RELEASE, A1 PULLEY, FOR TRIGGER FINGER
Anesthesia: Choice | Laterality: Left

## 2016-12-09 SURGERY — RELEASE, A1 PULLEY, FOR TRIGGER FINGER
Anesthesia: General | Laterality: Left

## 2016-12-09 MED ORDER — OXYCODONE HCL 5 MG PO TABS: 5.0000 mg | ORAL_TABLET | Freq: Once | ORAL | Status: DC | PRN

## 2016-12-09 MED ORDER — DEXTROSE 50 % IV SOLN
1.0000 | Freq: Once | INTRAVENOUS | Status: AC
  Administered 2016-12-09: 50 mL via INTRAVENOUS

## 2016-12-09 MED ORDER — FENTANYL CITRATE (PF) 100 MCG/2ML IJ SOLN
INTRAMUSCULAR | Status: AC
  Filled 2016-12-09: qty 2

## 2016-12-09 MED ORDER — LACTATED RINGERS IV SOLN
Freq: Once | INTRAVENOUS | Status: AC
  Administered 2016-12-09: 10:00:00 via INTRAVENOUS

## 2016-12-09 MED ORDER — LIDOCAINE 2% (20 MG/ML) 5 ML SYRINGE
INTRAMUSCULAR | Status: DC | PRN
  Administered 2016-12-09: 60 mg via INTRAVENOUS

## 2016-12-09 MED ORDER — MIDAZOLAM HCL 2 MG/2ML IJ SOLN
INTRAMUSCULAR | Status: AC
  Filled 2016-12-09: qty 2

## 2016-12-09 MED ORDER — PROPOFOL 10 MG/ML IV BOLUS
INTRAVENOUS | Status: DC | PRN
  Administered 2016-12-09: 200 mg via INTRAVENOUS

## 2016-12-09 MED ORDER — PROPOFOL 10 MG/ML IV BOLUS
INTRAVENOUS | Status: AC
  Filled 2016-12-09: qty 20

## 2016-12-09 MED ORDER — OXYCODONE HCL 5 MG/5ML PO SOLN: 5.0000 mg | Freq: Once | ORAL | Status: DC | PRN

## 2016-12-09 MED ORDER — BUPIVACAINE-EPINEPHRINE 0.25% -1:200000 IJ SOLN
INTRAMUSCULAR | Status: DC | PRN
  Administered 2016-12-09: 5 mL

## 2016-12-09 MED ORDER — MIDAZOLAM HCL 2 MG/2ML IJ SOLN
INTRAMUSCULAR | Status: DC | PRN
  Administered 2016-12-09: 2 mg via INTRAVENOUS

## 2016-12-09 MED ORDER — LIDOCAINE 2% (20 MG/ML) 5 ML SYRINGE
INTRAMUSCULAR | Status: AC
  Filled 2016-12-09: qty 5

## 2016-12-09 MED ORDER — ONDANSETRON HCL 4 MG/2ML IJ SOLN
INTRAMUSCULAR | Status: DC | PRN
  Administered 2016-12-09: 4 mg via INTRAVENOUS

## 2016-12-09 MED ORDER — 0.9 % SODIUM CHLORIDE (POUR BTL) OPTIME
TOPICAL | Status: DC | PRN
  Administered 2016-12-09: 1000 mL

## 2016-12-09 MED ORDER — FENTANYL CITRATE (PF) 100 MCG/2ML IJ SOLN: 25.0000 ug | INTRAMUSCULAR | Status: DC | PRN

## 2016-12-09 MED ORDER — HYDROCODONE-ACETAMINOPHEN 5-325 MG PO TABS: 1.0000 | ORAL_TABLET | ORAL | 0 refills | Status: DC | PRN

## 2016-12-09 MED ORDER — DEXTROSE 50 % IV SOLN
INTRAVENOUS | Status: AC
  Administered 2016-12-09: 50 mL via INTRAVENOUS
  Filled 2016-12-09: qty 50

## 2016-12-09 MED ORDER — ONDANSETRON HCL 4 MG/2ML IJ SOLN
4.0000 mg | Freq: Once | INTRAMUSCULAR | Status: DC | PRN
Start: 2016-12-09 — End: 2016-12-09

## 2016-12-09 MED ORDER — FENTANYL CITRATE (PF) 250 MCG/5ML IJ SOLN
INTRAMUSCULAR | Status: AC
  Filled 2016-12-09: qty 5

## 2016-12-09 MED ORDER — FENTANYL CITRATE (PF) 100 MCG/2ML IJ SOLN
INTRAMUSCULAR | Status: DC | PRN
  Administered 2016-12-09 (×3): 50 ug via INTRAVENOUS

## 2016-12-09 MED ORDER — CHLORHEXIDINE GLUCONATE 4 % EX LIQD: 60.0000 mL | Freq: Once | CUTANEOUS | Status: DC

## 2016-12-09 MED ORDER — LACTATED RINGERS IV SOLN
INTRAVENOUS | Status: DC
  Administered 2016-12-09: 12:00:00 via INTRAVENOUS

## 2016-12-09 MED ORDER — BUPIVACAINE-EPINEPHRINE (PF) 0.25% -1:200000 IJ SOLN
INTRAMUSCULAR | Status: AC
  Filled 2016-12-09: qty 30

## 2016-12-09 MED FILL — HYDROCODON-APAP 5-325: 5-325 | 3 days supply | Qty: 30 | Fill #0

## 2016-12-09 SURGICAL SUPPLY — 41 items
BANDAGE ACE 3X5.8 VEL STRL LF (GAUZE/BANDAGES/DRESSINGS) ×6 IMPLANT
BANDAGE ACE 4X5 VEL STRL LF (GAUZE/BANDAGES/DRESSINGS) ×3 IMPLANT
BNDG CMPR 75X41 PLY ABS (GAUZE/BANDAGES/DRESSINGS) ×1
BNDG CMPR 9X4 STRL LF SNTH (GAUZE/BANDAGES/DRESSINGS) ×1
BNDG COHESIVE 3X5 TAN STRL LF (GAUZE/BANDAGES/DRESSINGS) ×3 IMPLANT
BNDG CONFORM 2 STRL LF (GAUZE/BANDAGES/DRESSINGS) ×3 IMPLANT
BNDG ESMARK 4X9 LF (GAUZE/BANDAGES/DRESSINGS) ×3 IMPLANT
BNDG GAUZE ELAST 4 BULKY (GAUZE/BANDAGES/DRESSINGS) ×3 IMPLANT
BNDG STRETCH 4X75 NS LF (GAUZE/BANDAGES/DRESSINGS) ×3 IMPLANT
CORDS BIPOLAR (ELECTRODE) ×3 IMPLANT
COVER SURGICAL LIGHT HANDLE (MISCELLANEOUS) ×3 IMPLANT
CUFF TOURNIQUET SINGLE 18IN (TOURNIQUET CUFF) ×3 IMPLANT
CUFF TOURNIQUET SINGLE 24IN (TOURNIQUET CUFF) IMPLANT
DRAPE SURG 17X23 STRL (DRAPES) ×3 IMPLANT
GAUZE SPONGE 4X4 12PLY STRL (GAUZE/BANDAGES/DRESSINGS) ×3 IMPLANT
GAUZE SPONGE 4X4 12PLY STRL LF (GAUZE/BANDAGES/DRESSINGS) ×3 IMPLANT
GAUZE XEROFORM 1X8 LF (GAUZE/BANDAGES/DRESSINGS) ×3 IMPLANT
GLOVE BIO SURGEON STRL SZ8 (GLOVE) ×3 IMPLANT
GLOVE BIOGEL PI IND STRL 8 (GLOVE) ×2 IMPLANT
GLOVE BIOGEL PI INDICATOR 8 (GLOVE) ×4
GLOVE SS N UNI LF 8.0 STRL (GLOVE) ×3 IMPLANT
GOWN STRL REUS W/ TWL LRG LVL3 (GOWN DISPOSABLE) ×1 IMPLANT
GOWN STRL REUS W/ TWL XL LVL3 (GOWN DISPOSABLE) ×2 IMPLANT
GOWN STRL REUS W/TWL LRG LVL3 (GOWN DISPOSABLE) ×3
GOWN STRL REUS W/TWL XL LVL3 (GOWN DISPOSABLE) ×6
KIT BASIN OR (CUSTOM PROCEDURE TRAY) ×3 IMPLANT
KIT ROOM TURNOVER OR (KITS) ×3 IMPLANT
LOOP VESSEL MAXI BLUE (MISCELLANEOUS) IMPLANT
NEEDLE HYPO 25GX1X1/2 BEV (NEEDLE) IMPLANT
NS IRRIG 1000ML POUR BTL (IV SOLUTION) ×3 IMPLANT
PACK ORTHO EXTREMITY (CUSTOM PROCEDURE TRAY) ×3 IMPLANT
PAD ARMBOARD 7.5X6 YLW CONV (MISCELLANEOUS) ×6 IMPLANT
PAD CAST 4YDX4 CTTN HI CHSV (CAST SUPPLIES) ×2 IMPLANT
PADDING CAST COTTON 4X4 STRL (CAST SUPPLIES) ×6
SUT ETHILON 3 0 PS 1 (SUTURE) IMPLANT
SYR CONTROL 10ML LL (SYRINGE) IMPLANT
SYSTEM CHEST DRAIN TLS 7FR (DRAIN) IMPLANT
TOWEL OR 17X26 10 PK STRL BLUE (TOWEL DISPOSABLE) ×3 IMPLANT
TUBE CONNECTING 12'X1/4 (SUCTIONS)
TUBE CONNECTING 12X1/4 (SUCTIONS) IMPLANT
UNDERPAD 30X30 (UNDERPADS AND DIAPERS) ×3 IMPLANT

## 2016-12-09 NOTE — Interval H&P Note (Signed)
History and Physical Interval Note:  12/09/2016 11:12 AM  Stephen Arnold  has presented today for surgery, with the diagnosis of LEFT TRIGGER THUMB  The various methods of treatment have been discussed with the patient and family. After consideration of risks, benefits and other options for treatment, the patient has consented to  Procedure(s): RELEASE TRIGGER FINGER/A-1 PULLEY LEFT THUMB (Left) as a surgical intervention .  The patient's history has been reviewed, patient examined, no change in status, stable for surgery.  I have reviewed the patient's chart and labs.  Questions were answered to the patient's satisfaction.     Jakeisha Stricker G

## 2016-12-09 NOTE — Anesthesia Postprocedure Evaluation (Signed)
Anesthesia Post Note  Patient: Stephen Arnold  Procedure(s) Performed: Procedure(s) (LRB): RELEASE TRIGGER FINGER/A-1 PULLEY LEFT THUMB (Left)     Patient location during evaluation: PACU Anesthesia Type: General Level of consciousness: awake and alert Pain management: pain level controlled Vital Signs Assessment: post-procedure vital signs reviewed and stable Respiratory status: spontaneous breathing, nonlabored ventilation, respiratory function stable and patient connected to nasal cannula oxygen Cardiovascular status: blood pressure returned to baseline and stable Postop Assessment: no signs of nausea or vomiting Anesthetic complications: no    Last Vitals:  Vitals:   12/09/16 0953 12/09/16 1320  BP: 132/79   Pulse: 78   Resp: 20   Temp: 37 C 36.5 C  SpO2: 98%     Last Pain:  Vitals:   12/09/16 0953  TempSrc: Oral                 Delayla Hoffmaster

## 2016-12-09 NOTE — Transfer of Care (Signed)
Immediate Anesthesia Transfer of Care Note  Patient: Stephen Arnold  Procedure(s) Performed: Procedure(s): RELEASE TRIGGER FINGER/A-1 PULLEY LEFT THUMB (Left)  Patient Location: PACU  Anesthesia Type:General  Level of Consciousness: awake, alert , oriented and patient cooperative  Airway & Oxygen Therapy: Patient Spontanous Breathing  Post-op Assessment: Report given to RN and Post -op Vital signs reviewed and stable  Post vital signs: Reviewed and stable  Last Vitals:  Vitals:   12/09/16 0953  BP: 132/79  Pulse: 78  Resp: 20  Temp: 37 C  SpO2: 98%    Last Pain:  Vitals:   12/09/16 0953  TempSrc: Oral         Complications: No apparent anesthesia complications

## 2016-12-09 NOTE — Anesthesia Procedure Notes (Signed)
Procedure Name: LMA Insertion Date/Time: 12/09/2016 12:34 PM Performed by: Everlean Cherry A Pre-anesthesia Checklist: Patient identified, Emergency Drugs available, Suction available and Patient being monitored Patient Re-evaluated:Patient Re-evaluated prior to induction Oxygen Delivery Method: Circle system utilized Preoxygenation: Pre-oxygenation with 100% oxygen Induction Type: IV induction LMA: LMA inserted LMA Size: 5.0 Placement Confirmation: positive ETCO2 and breath sounds checked- equal and bilateral Tube secured with: Tape Dental Injury: Teeth and Oropharynx as per pre-operative assessment

## 2016-12-09 NOTE — Progress Notes (Signed)
Pt here for pre-op with 51 year old son.  Initially stated that son would be driving him home.  Informed pt of policy stating a competent adult must accompany him for 24 hours post anesthesia.  Pt then stated wife, Maudie Mercury, would be able to take him home.

## 2016-12-09 NOTE — Op Note (Signed)
NAME:  Stephen Arnold, Stephen Arnold                ACCOUNT NO.:  MEDICAL RECORD NO.:  [id number]  LOCATION:                                 FACILITY:  PHYSICIAN:  Monico Blitz. Rhona Raider, M.D.     DATE OF BIRTH:  DATE OF PROCEDURE:  12/09/2016 DATE OF DISCHARGE:                              OPERATIVE REPORT   PREOPERATIVE DIAGNOSIS:  Left trigger thumb.  POSTOPERATIVE DIAGNOSIS:  Left trigger thumb.  PROCEDURE:  Left trigger thumb release.  ANESTHESIA:  General.  ATTENDING SURGEON:  Monico Blitz. Rhona Raider, M.D.  ASSISTANT:  Loni Dolly, PA.  INDICATION FOR PROCEDURE:  The patient is a 51 year old male with a long history of left trigger thumb.  I have injected him on multiple occasions through the years.  At this point, he cannot flex his thumb significantly.  He has pain and inability to use his thumb and he has offered a release.  Informed operative consent was obtained after discussion of possible complications including reaction to anesthesia, infection, and neurovascular injury.  SUMMARY OF FINDINGS AND PROCEDURE:  Under general anesthesia, through a small incision, the A1 pulley of the left thumb was released.  He then could move his thumb passively through a full range of motion.  He was closed primarily and discharged home.  DESCRIPTION OF PROCEDURE:  The patient was taken to the operating suite where general anesthetic was applied without difficulty.  He was positioned supine and prepped and draped in a normal sterile fashion. After administration of preop IV Kefzol and an appropriate time-out, the left arm was elevated, exsanguinated, and tourniquet inflated about the forearm.  We made an incision at the MCP flexion crease with dissection down to the flexor tendon sheath.  I made a longitudinal incision in this area through the A1 pulley.  This seemed to improve his flexion and extension.  I pulled the tendon out through the incision to confirm adequate release.  The wound was then  irrigated followed by reapproximation of the skin with nylon.  The tourniquet was deflated and a small amount of bleeding was easily controlled with some pressure.  I injected some local anesthetic about the incision site.  Adaptic was applied followed by dry gauze and a loose Ace wrap.  Estimated blood loss and fluids can be obtained from anesthesia records as can accurate tourniquet time.  DISPOSITION:  The patient was extubated in the operating room and taken to recovery room in stable addition.  He was to go home same-day and follow up in the office closely.  I will contact him by phone tonight.     Monico Blitz Rhona Raider, M.D.     PGD/MEDQ  D:  12/09/2016  T:  12/09/2016  Job:  [id number]

## 2016-12-09 NOTE — Anesthesia Preprocedure Evaluation (Addendum)
Anesthesia Evaluation  Patient identified by MRN, date of birth, ID band Patient awake    Reviewed: Allergy & Precautions, NPO status , Patient's Chart, lab work & pertinent test results  Airway Mallampati: II  TM Distance: >3 FB Neck ROM: Full    Dental  (+) Teeth Intact, Dental Advisory Given   Pulmonary    breath sounds clear to auscultation       Cardiovascular hypertension,  Rhythm:Regular Rate:Normal     Neuro/Psych    GI/Hepatic   Endo/Other  diabetes  Renal/GU      Musculoskeletal   Abdominal   Peds  Hematology   Anesthesia Other Findings   Reproductive/Obstetrics                             Anesthesia Physical Anesthesia Plan  ASA: III  Anesthesia Plan: General   Post-op Pain Management:    Induction: Intravenous  PONV Risk Score and Plan: Ondansetron  Airway Management Planned: LMA  Additional Equipment:   Intra-op Plan:   Post-operative Plan:   Informed Consent: I have reviewed the patients History and Physical, chart, labs and discussed the procedure including the risks, benefits and alternatives for the proposed anesthesia with the patient or authorized representative who has indicated his/her understanding and acceptance.     Dental advisory given  Plan Discussed with: CRNA and Anesthesiologist  Anesthesia Plan Comments:         Anesthesia Quick Evaluation  

## 2016-12-09 NOTE — Op Note (Signed)
Stephen Arnold [id number] 12/09/2016   PRE-OP DIAGNOSIS: left trigger thumb  POST-OP DIAGNOSIS: same  PROCEDURE: left trigger thumb release  ANESTHESIA: general  [name] G   Dictation #:  [id number]

## 2016-12-10 ENCOUNTER — Encounter (HOSPITAL_COMMUNITY): Payer: Self-pay | Admitting: Orthopaedic Surgery

## 2016-12-10 ENCOUNTER — Ambulatory Visit: Payer: 59 | Admitting: Physical Therapy

## 2016-12-10 DIAGNOSIS — R293 Abnormal posture: Secondary | ICD-10-CM

## 2016-12-10 DIAGNOSIS — M542 Cervicalgia: Secondary | ICD-10-CM
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
## 2016-12-14 ENCOUNTER — Ambulatory Visit: Payer: 59

## 2016-12-14 DIAGNOSIS — M542 Cervicalgia: Secondary | ICD-10-CM

## 2016-12-14 DIAGNOSIS — R293 Abnormal posture: Secondary | ICD-10-CM | POA: Diagnosis not present
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
## 2016-12-17 ENCOUNTER — Other Ambulatory Visit: Payer: Self-pay | Admitting: Physician Assistant

## 2016-12-17 ENCOUNTER — Ambulatory Visit: Payer: 59 | Admitting: Physical Therapy

## 2016-12-17 MED FILL — METOPROLOL SUCC ER 25 MG TA: 25 | 90 days supply | Qty: 90 | Fill #0

## 2016-12-17 MED FILL — BRILINTA 90 MG TABLET: 90 | 30 days supply | Qty: 60 | Fill #4

## 2016-12-17 MED FILL — ASPIRIN ADULT LOW STRENGTH: 81 | 30 days supply | Qty: 30 | Fill #7

## 2016-12-21 ENCOUNTER — Ambulatory Visit: Payer: 59

## 2016-12-24 ENCOUNTER — Telehealth: Payer: Self-pay | Admitting: Internal Medicine

## 2016-12-24 ENCOUNTER — Ambulatory Visit: Payer: 59 | Attending: Orthopaedic Surgery | Admitting: Physical Therapy

## 2016-12-24 DIAGNOSIS — Z9889 Other specified postprocedural states: Secondary | ICD-10-CM | POA: Diagnosis not present

## 2016-12-24 DIAGNOSIS — R293 Abnormal posture: Secondary | ICD-10-CM | POA: Insufficient documentation

## 2016-12-24 DIAGNOSIS — M542 Cervicalgia: Secondary | ICD-10-CM | POA: Insufficient documentation

## 2016-12-24 NOTE — Telephone Encounter (Signed)
Patient wants to be sure with his medications, that if he consumed a few alcoholic beverages it would be safe.   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  Otherwise he does not drink alcohol.  He is aware I will forward to Dr. Harrington Challenger and someone will call him back.

## 2016-12-24 NOTE — Telephone Encounter (Signed)
OK to have a couple drinks occasionally

## 2016-12-24 NOTE — Telephone Encounter (Signed)
New message      Pt is calling to ask when can he consume alcohol again?

## 2016-12-28 ENCOUNTER — Other Ambulatory Visit: Payer: Self-pay | Admitting: Internal Medicine

## 2016-12-28 ENCOUNTER — Ambulatory Visit: Payer: 59 | Admitting: Physical Therapy

## 2016-12-28 DIAGNOSIS — M542 Cervicalgia: Secondary | ICD-10-CM | POA: Diagnosis not present

## 2016-12-28 DIAGNOSIS — R293 Abnormal posture: Secondary | ICD-10-CM | POA: Diagnosis not present

## 2016-12-28 MED FILL — REPATHA 140 MG/1 ML INJ: 140 | 28 days supply | Qty: 2 | Fill #0
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
## 2016-12-31 ENCOUNTER — Ambulatory Visit: Payer: 59 | Admitting: Physical Therapy

## 2016-12-31 DIAGNOSIS — R293 Abnormal posture: Secondary | ICD-10-CM

## 2016-12-31 DIAGNOSIS — M542 Cervicalgia: Secondary | ICD-10-CM | POA: Diagnosis not present
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
## 2017-01-04 ENCOUNTER — Ambulatory Visit: Payer: 59 | Admitting: Physical Therapy

## 2017-01-04 DIAGNOSIS — M542 Cervicalgia: Secondary | ICD-10-CM

## 2017-01-04 DIAGNOSIS — R293 Abnormal posture: Secondary | ICD-10-CM | POA: Diagnosis not present
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
## 2017-01-05 ENCOUNTER — Ambulatory Visit: Payer: 59 | Admitting: Physical Therapy

## 2017-01-11 ENCOUNTER — Ambulatory Visit: Payer: 59 | Admitting: Physical Therapy

## 2017-01-11 NOTE — Telephone Encounter (Signed)
Left detailed message on voice mail (DPR).  Advised to call back with any further questions/concerns.

## 2017-01-13 ENCOUNTER — Encounter: Payer: 59 | Admitting: Physical Therapy

## 2017-01-18 ENCOUNTER — Ambulatory Visit: Payer: 59 | Attending: Orthopaedic Surgery | Admitting: Physical Therapy

## 2017-01-18 DIAGNOSIS — M542 Cervicalgia: Secondary | ICD-10-CM | POA: Insufficient documentation

## 2017-01-18 DIAGNOSIS — R293 Abnormal posture: Secondary | ICD-10-CM | POA: Insufficient documentation

## 2017-01-20 ENCOUNTER — Ambulatory Visit: Payer: 59 | Admitting: Physical Therapy

## 2017-01-20 DIAGNOSIS — M542 Cervicalgia: Secondary | ICD-10-CM | POA: Diagnosis not present

## 2017-01-20 DIAGNOSIS — R293 Abnormal posture: Secondary | ICD-10-CM
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
## 2017-01-24 MED FILL — ASPIRIN ADULT LOW STRENGTH: 81 | 30 days supply | Qty: 30 | Fill #8

## 2017-01-24 MED FILL — REPATHA 140 MG/1 ML INJ: 140 | 28 days supply | Qty: 2 | Fill #1

## 2017-01-24 MED FILL — BRILINTA 90 MG TABLET: 90 | 30 days supply | Qty: 60 | Fill #5

## 2017-01-25 ENCOUNTER — Ambulatory Visit: Payer: 59 | Admitting: Physical Therapy

## 2017-01-27 ENCOUNTER — Ambulatory Visit: Payer: 59 | Admitting: Physical Therapy

## 2017-01-27 DIAGNOSIS — M542 Cervicalgia: Secondary | ICD-10-CM

## 2017-01-27 DIAGNOSIS — R293 Abnormal posture: Secondary | ICD-10-CM
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
## 2017-01-31 MED FILL — BD PEN NDL NANO 32GX5/32: 32G X 4 MM | 28 days supply | Qty: 200 | Fill #0

## 2017-01-31 MED FILL — BD PEN NDL NANO 32GX5/32": 32G X 4 MM | 28 days supply | Qty: 200 | Fill #0

## 2017-02-01 ENCOUNTER — Ambulatory Visit: Payer: 59 | Admitting: Physical Therapy

## 2017-02-03 ENCOUNTER — Ambulatory Visit: Payer: 59 | Admitting: Physical Therapy

## 2017-02-07 DIAGNOSIS — L821 Other seborrheic keratosis: Secondary | ICD-10-CM | POA: Diagnosis not present

## 2017-02-07 DIAGNOSIS — D2371 Other benign neoplasm of skin of right lower limb, including hip: Secondary | ICD-10-CM | POA: Diagnosis not present

## 2017-02-07 DIAGNOSIS — D225 Melanocytic nevi of trunk: Secondary | ICD-10-CM | POA: Diagnosis not present

## 2017-02-17 ENCOUNTER — Encounter: Payer: Self-pay | Admitting: Internal Medicine

## 2017-02-17 ENCOUNTER — Ambulatory Visit (INDEPENDENT_AMBULATORY_CARE_PROVIDER_SITE_OTHER): Payer: 59 | Admitting: Internal Medicine

## 2017-02-17 VITALS — BP 130/70 | HR 84 | Ht 72.0 in | Wt 243.0 lb

## 2017-02-17 DIAGNOSIS — E782 Mixed hyperlipidemia: Secondary | ICD-10-CM | POA: Diagnosis not present

## 2017-02-17 DIAGNOSIS — I2510 Atherosclerotic heart disease of native coronary artery without angina pectoris: Secondary | ICD-10-CM
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
## 2017-02-17 NOTE — Patient Instructions (Addendum)
Medication Instructions:  Your physician recommends that you continue on your current medications as directed. Please refer to the Current Medication list given to you today.   Labwork: NONE ORDERED TODAY  Testing/Procedures: NONE ORDERED TODAY  Follow-Up: 05/13/17 @ 9:40 WITH DR. ROSS   Any Other Special Instructions Will Be Listed Below (If Applicable).     If you need a refill on your cardiac medications before your next appointment, please call your pharmacy.

## 2017-02-18 MED FILL — TOUJEO SOLOSTAR 300 UNITS/M: 300 | 90 days supply | Qty: 18 | Fill #1

## 2017-02-21 ENCOUNTER — Encounter: Payer: Self-pay | Admitting: Physical Therapy

## 2017-02-21 ENCOUNTER — Ambulatory Visit: Payer: 59 | Attending: Orthopaedic Surgery | Admitting: Physical Therapy

## 2017-02-21 DIAGNOSIS — R293 Abnormal posture: Secondary | ICD-10-CM | POA: Diagnosis not present

## 2017-02-21 DIAGNOSIS — M542 Cervicalgia: Secondary | ICD-10-CM | POA: Diagnosis not present

## 2017-02-21 DIAGNOSIS — E1165 Type 2 diabetes mellitus with hyperglycemia: Secondary | ICD-10-CM | POA: Diagnosis not present
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
## 2017-02-24 DIAGNOSIS — E1165 Type 2 diabetes mellitus with hyperglycemia: Secondary | ICD-10-CM | POA: Diagnosis not present

## 2017-02-24 MED FILL — BRILINTA 90 MG TABLET: 90 | 30 days supply | Qty: 60 | Fill #6

## 2017-02-24 MED FILL — ASPIRIN ADULT LOW STRENGTH: 81 | 30 days supply | Qty: 30 | Fill #9

## 2017-02-24 MED FILL — REPATHA 140 MG/1 ML INJ: 140 | 28 days supply | Qty: 2 | Fill #2

## 2017-02-25 ENCOUNTER — Ambulatory Visit: Payer: 59 | Admitting: Physical Therapy

## 2017-02-25 ENCOUNTER — Encounter: Payer: Self-pay | Admitting: Physical Therapy

## 2017-02-25 DIAGNOSIS — M65311 Trigger thumb, right thumb: Secondary | ICD-10-CM | POA: Diagnosis not present

## 2017-02-25 DIAGNOSIS — M542 Cervicalgia: Secondary | ICD-10-CM

## 2017-02-25 DIAGNOSIS — M65312 Trigger thumb, left thumb: Secondary | ICD-10-CM | POA: Diagnosis not present

## 2017-02-25 DIAGNOSIS — R293 Abnormal posture: Secondary | ICD-10-CM | POA: Diagnosis not present

## 2017-02-25 DIAGNOSIS — M25562 Pain in left knee: Secondary | ICD-10-CM | POA: Diagnosis not present

## 2017-02-25 MED FILL — HUMALOG 100 UNITS/ML KWIKPE: 100 | 50 days supply | Qty: 30 | Fill #0

## 2017-02-25 MED FILL — BD PEN NDL NANO 32GX5/32": 32G X 4 MM | 28 days supply | Qty: 200 | Fill #0

## 2017-02-25 MED FILL — BD PEN NDL NANO 32GX5/32: 32G X 4 MM | 28 days supply | Qty: 200 | Fill #0
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
## 2017-03-07 ENCOUNTER — Emergency Department (HOSPITAL_BASED_OUTPATIENT_CLINIC_OR_DEPARTMENT_OTHER): Payer: 59

## 2017-03-07 ENCOUNTER — Other Ambulatory Visit: Payer: Self-pay

## 2017-03-07 ENCOUNTER — Emergency Department (HOSPITAL_BASED_OUTPATIENT_CLINIC_OR_DEPARTMENT_OTHER)
Admission: EM | Admit: 2017-03-07 | Discharge: 2017-03-07 | Disposition: A | Discharge status: Home / Self Care | Payer: 59 | Attending: Emergency Medicine | Admitting: Emergency Medicine

## 2017-03-07 ENCOUNTER — Encounter (HOSPITAL_BASED_OUTPATIENT_CLINIC_OR_DEPARTMENT_OTHER): Payer: Self-pay | Admitting: *Deleted

## 2017-03-07 DIAGNOSIS — Z7982 Long term (current) use of aspirin: Secondary | ICD-10-CM | POA: Insufficient documentation

## 2017-03-07 DIAGNOSIS — I2510 Atherosclerotic heart disease of native coronary artery without angina pectoris: Secondary | ICD-10-CM | POA: Diagnosis not present

## 2017-03-07 DIAGNOSIS — Z79899 Other long term (current) drug therapy: Secondary | ICD-10-CM | POA: Insufficient documentation

## 2017-03-07 DIAGNOSIS — K5732 Diverticulitis of large intestine without perforation or abscess without bleeding: Secondary | ICD-10-CM | POA: Insufficient documentation

## 2017-03-07 DIAGNOSIS — Z794 Long term (current) use of insulin: Secondary | ICD-10-CM | POA: Diagnosis not present

## 2017-03-07 DIAGNOSIS — K5792 Diverticulitis of intestine, part unspecified, without perforation or abscess without bleeding: Secondary | ICD-10-CM

## 2017-03-07 DIAGNOSIS — R1032 Left lower quadrant pain: Secondary | ICD-10-CM | POA: Diagnosis not present

## 2017-03-07 DIAGNOSIS — I10 Essential (primary) hypertension: Secondary | ICD-10-CM | POA: Insufficient documentation

## 2017-03-07 DIAGNOSIS — E119 Type 2 diabetes mellitus without complications: Secondary | ICD-10-CM | POA: Diagnosis not present

## 2017-03-07 LAB — COMPREHENSIVE METABOLIC PANEL
ALBUMIN: 3.6 g/dL (ref 3.5–5.0)
ALK PHOS: 52 U/L (ref 38–126)
ALT: 18 U/L (ref 17–63)
AST: 14 U/L — AB (ref 15–41)
Anion gap: 5 (ref 5–15)
BILIRUBIN TOTAL: 0.2 mg/dL — AB (ref 0.3–1.2)
BUN: 10 mg/dL (ref 6–20)
CALCIUM: 8.4 mg/dL — AB (ref 8.9–10.3)
CO2: 27 mmol/L (ref 22–32)
CREATININE: 0.83 mg/dL (ref 0.61–1.24)
Chloride: 100 mmol/L — ABNORMAL LOW (ref 101–111)
GFR calc Af Amer: >60 mL/min (ref >60)
GFR calc non Af Amer: >60 mL/min (ref >60)
GLUCOSE: 306 mg/dL — AB (ref 65–99)
Potassium: 3.5 mmol/L (ref 3.5–5.1)
SODIUM: 132 mmol/L — AB (ref 135–145)
Total Protein: 7.2 g/dL (ref 6.5–8.1)

## 2017-03-07 LAB — CBC
HCT: 36.7 % — ABNORMAL LOW (ref 39.0–52.0)
HEMOGLOBIN: 12 g/dL — AB (ref 13.0–17.0)
MCH: 27.3 pg (ref 26.0–34.0)
MCHC: 32.7 g/dL (ref 30.0–36.0)
MCV: 83.4 fL (ref 78.0–100.0)
PLATELETS: 409 10*3/uL — AB (ref 150–400)
RBC: 4.4 MIL/uL (ref 4.22–5.81)
RDW: 12.7 % (ref 11.5–15.5)
WBC: 7.9 10*3/uL (ref 4.0–10.5)

## 2017-03-07 LAB — URINALYSIS, MICROSCOPIC (REFLEX): WBC, UA: NONE SEEN WBC/hpf (ref 0–5)

## 2017-03-07 LAB — URINALYSIS, ROUTINE W REFLEX MICROSCOPIC
BILIRUBIN URINE: NEGATIVE
Glucose, UA: >=500 mg/dL — AB
KETONES UR: NEGATIVE mg/dL
Leukocytes, UA: NEGATIVE
Nitrite: NEGATIVE
Protein, ur: NEGATIVE mg/dL
Specific Gravity, Urine: 1.025 (ref 1.005–1.030)
pH: 6 (ref 5.0–8.0)

## 2017-03-07 LAB — LIPASE, BLOOD: Lipase: 35 U/L (ref 11–51)

## 2017-03-07 MED ORDER — PROMETHAZINE HCL 25 MG PO TABS: 25.0000 mg | ORAL_TABLET | Freq: Four times a day (QID) | ORAL | 0 refills | Status: DC | PRN

## 2017-03-07 MED ORDER — OXYCODONE-ACETAMINOPHEN 5-325 MG PO TABS: 1.0000 | ORAL_TABLET | Freq: Four times a day (QID) | ORAL | 0 refills | Status: DC | PRN

## 2017-03-07 MED ORDER — METRONIDAZOLE 500 MG PO TABS
500.0000 mg | ORAL_TABLET | Freq: Once | ORAL | Status: AC
  Administered 2017-03-07: 500 mg via ORAL
  Filled 2017-03-07: qty 1

## 2017-03-07 MED ORDER — CIPROFLOXACIN HCL 500 MG PO TABS: 500.0000 mg | ORAL_TABLET | Freq: Once | ORAL | Status: DC

## 2017-03-07 MED ORDER — SODIUM CHLORIDE 0.9 % IV BOLUS (SEPSIS)
1000.0000 mL | Freq: Once | INTRAVENOUS | Status: AC
  Administered 2017-03-07: 1000 mL via INTRAVENOUS

## 2017-03-07 MED ORDER — HYDROMORPHONE HCL 1 MG/ML IJ SOLN
1.0000 mg | Freq: Once | INTRAMUSCULAR | Status: AC
  Administered 2017-03-07: 1 mg via INTRAVENOUS
  Filled 2017-03-07: qty 1

## 2017-03-07 MED ORDER — CIPROFLOXACIN HCL 500 MG PO TABS
500.0000 mg | ORAL_TABLET | Freq: Two times a day (BID) | ORAL | 0 refills | Status: DC
Start: 2017-03-07 — End: 2017-08-01

## 2017-03-07 MED ORDER — METRONIDAZOLE 500 MG PO TABS: 500.0000 mg | ORAL_TABLET | Freq: Three times a day (TID) | ORAL | 0 refills | Status: AC

## 2017-03-07 NOTE — Discharge Instructions (Signed)
It is important to follow up with gastroenterology in 2-3 days or as soon as possible when the office reopens around Thanksgiving.  Try to maintain a clear liquid diet to help with your pain and symptoms until you are reassessed by gastroenterology.  I have attached additional information on clear liquid diets along with your discharge paperwork.  Take 1 tablet of metronidazole every 8 hours for the next 10 days.  Take 1 tablet of ciprofloxacin every 12 hours for the next 10 days.  Your first dose of both of these medications have been given in the emergency department.  You may take 1 tablet of Phenergan every 6 hours as needed for nausea or vomiting.  Take 1 tablet of Percocet can be taken for severe pain once every 6 hours; however you do not need to take a tablet every 6 hours if your pain is improved.  Please note this is a narcotic medication and can be addicting.  Please not drive while taking either Phenergan or Percocet because these medications can make you impaired and/or drowsy.  If you develop new or worsening symptoms, including fever, chills, worsening abdominal pain after 72 hours of antibiotics, lime green or bloody vomiting, bright red blood in your stool or your stools turn a tarry black color, or other concerning symptoms, please return to the emergency department for reevaluation.

## 2017-03-07 NOTE — ED Notes (Signed)
Patient transported to CT 

## 2017-03-07 NOTE — ED Triage Notes (Signed)
GI bug with diarrhea for 5 days. He was treated with zpak. He took the last pill this am. Now he has severe left mid quadrant pain. No blood noted.
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
## 2017-03-08 ENCOUNTER — Ambulatory Visit: Payer: 59 | Admitting: Physical Therapy

## 2017-03-09 MED FILL — AMOX-CLAV 875-125 MG TABLET: 875-125 | 7 days supply | Qty: 14 | Fill #0

## 2017-03-18 DIAGNOSIS — K5792 Diverticulitis of intestine, part unspecified, without perforation or abscess without bleeding: Secondary | ICD-10-CM | POA: Diagnosis not present

## 2017-03-22 MED FILL — METOPROLOL SUCC ER 25 MG TA: 25 | 90 days supply | Qty: 90 | Fill #1

## 2017-03-25 MED FILL — BRILINTA 90 MG TABLET: 90 | 30 days supply | Qty: 60 | Fill #7

## 2017-03-31 DIAGNOSIS — M65351 Trigger finger, right little finger: Secondary | ICD-10-CM | POA: Diagnosis not present

## 2017-03-31 DIAGNOSIS — M65311 Trigger thumb, right thumb: Secondary | ICD-10-CM | POA: Diagnosis not present

## 2017-03-31 DIAGNOSIS — M65321 Trigger finger, right index finger: Secondary | ICD-10-CM | POA: Diagnosis not present

## 2017-04-01 MED FILL — OXYCODONE-ACETAMINOPHEN 5-3: 5-325 | 5 days supply | Qty: 30 | Fill #0

## 2017-04-13 DIAGNOSIS — Z9889 Other specified postprocedural states: Secondary | ICD-10-CM | POA: Diagnosis not present

## 2017-04-13 MED FILL — ASPIRIN ADULT LOW STRENGTH: 81 | 30 days supply | Qty: 30 | Fill #10

## 2017-04-13 MED FILL — REPATHA SURECLICK 140 MG/ML: 140 | 28 days supply | Qty: 2 | Fill #3

## 2017-04-13 MED FILL — HUMALOG 100 UNITS/ML KWIKPE: 100 | 50 days supply | Qty: 30 | Fill #1

## 2017-04-13 MED FILL — BD PEN NDL NANO 32GX5/32": 32G X 4 MM | 28 days supply | Qty: 200 | Fill #1

## 2017-04-13 MED FILL — BD PEN NDL NANO 32GX5/32: 32G X 4 MM | 28 days supply | Qty: 200 | Fill #1

## 2017-04-18 MED FILL — BRILINTA 90 MG TABLET: 90 | 30 days supply | Qty: 60 | Fill #8

## 2017-05-10 ENCOUNTER — Telehealth: Payer: Self-pay | Admitting: Internal Medicine

## 2017-05-10 DIAGNOSIS — E7800 Pure hypercholesterolemia, unspecified: Secondary | ICD-10-CM

## 2017-05-10 DIAGNOSIS — I2510 Atherosclerotic heart disease of native coronary artery without angina pectoris: Secondary | ICD-10-CM

## 2017-05-10 MED FILL — REPATHA SURECLICK 140 MG/ML: 140 | 28 days supply | Qty: 2 | Fill #4

## 2017-05-10 NOTE — Telephone Encounter (Signed)
New Message  Pt c/o medication issue:  1. Name of Medication: brilinta   2. How are you currently taking this medication (dosage and times per day)? 90mg    3. Are you having a reaction (difficulty breathing--STAT)? No  4. What is your medication issue? Per pt  Wife would like to know if the medication needs to be refilled until pts appt r/s for 2/25. Please call back to discuss

## 2017-05-10 NOTE — Telephone Encounter (Signed)
Left a message for the pt and wife to call back.

## 2017-05-11 NOTE — Telephone Encounter (Signed)
Follow up    Patient wife returning call, please call at work. Okay to leave detailed message

## 2017-05-11 NOTE — Telephone Encounter (Signed)
Left patient's wife to call back regarding her husband's Brilinta prescription.

## 2017-05-13 ENCOUNTER — Ambulatory Visit: Payer: 59 | Admitting: Internal Medicine

## 2017-05-16 MED ORDER — TICAGRELOR 90 MG PO TABS: 90.0000 mg | ORAL_TABLET | Freq: Two times a day (BID) | ORAL | 0 refills | Status: DC

## 2017-05-16 NOTE — Telephone Encounter (Signed)
I would keep on one more month of Brilinta until I see hiim He should have CBC, BMET and lipids done before visit

## 2017-05-16 NOTE — Telephone Encounter (Signed)
Spoke with patient. Advised one more month of Brilinta. Prescription sent to pharmacy. Orders placed for labs. Pt to call office to make appointment for blood work when he knows his schedule.  Knows to come before appt with PR.

## 2017-05-17 MED FILL — BRILINTA 90 MG TABLET: 90 | 30 days supply | Qty: 60 | Fill #0

## 2017-05-25 MED FILL — TOUJEO SOLOSTAR 300 UNITS/M: 300 | 90 days supply | Qty: 18 | Fill #2

## 2017-05-31 DIAGNOSIS — E785 Hyperlipidemia, unspecified: Secondary | ICD-10-CM | POA: Diagnosis not present

## 2017-05-31 DIAGNOSIS — E1165 Type 2 diabetes mellitus with hyperglycemia: Secondary | ICD-10-CM | POA: Diagnosis not present

## 2017-06-10 ENCOUNTER — Other Ambulatory Visit: Payer: 59 | Admitting: *Deleted

## 2017-06-10 DIAGNOSIS — E7800 Pure hypercholesterolemia, unspecified: Secondary | ICD-10-CM | POA: Diagnosis not present

## 2017-06-10 DIAGNOSIS — I2510 Atherosclerotic heart disease of native coronary artery without angina pectoris: Secondary | ICD-10-CM | POA: Diagnosis not present

## 2017-06-11 LAB — BASIC METABOLIC PANEL
BUN/Creatinine Ratio: 20 (ref 9–20)
BUN: 16 mg/dL (ref 6–24)
CALCIUM: 9.2 mg/dL (ref 8.7–10.2)
CHLORIDE: 102 mmol/L (ref 96–106)
CO2: 24 mmol/L (ref 20–29)
Creatinine, Ser: 0.8 mg/dL (ref 0.76–1.27)
GFR calc Af Amer: 119 mL/min/{1.73_m2} (ref >59)
GFR calc non Af Amer: 103 mL/min/{1.73_m2} (ref >59)
GLUCOSE: 216 mg/dL — AB (ref 65–99)
POTASSIUM: 4 mmol/L (ref 3.5–5.2)
Sodium: 141 mmol/L (ref 134–144)

## 2017-06-11 LAB — LIPID PANEL
Chol/HDL Ratio: 2.9 ratio (ref 0.0–5.0)
Cholesterol, Total: 114 mg/dL (ref 100–199)
HDL: 40 mg/dL (ref >39)
LDL Calculated: 19 mg/dL (ref 0–99)
TRIGLYCERIDES: 275 mg/dL — AB (ref 0–149)
VLDL Cholesterol Cal: 55 mg/dL — ABNORMAL HIGH (ref 5–40)

## 2017-06-11 LAB — CBC
Hematocrit: 37.7 % (ref 37.5–51.0)
Hemoglobin: 12.4 g/dL — ABNORMAL LOW (ref 13.0–17.7)
MCH: 26.9 pg (ref 26.6–33.0)
MCHC: 32.9 g/dL (ref 31.5–35.7)
MCV: 82 fL (ref 79–97)
PLATELETS: 419 10*3/uL — AB (ref 150–379)
RBC: 4.61 x10E6/uL (ref 4.14–5.80)
RDW: 13.8 % (ref 12.3–15.4)
WBC: 9.8 10*3/uL (ref 3.4–10.8)

## 2017-06-13 ENCOUNTER — Ambulatory Visit: Payer: 59 | Admitting: Internal Medicine

## 2017-06-13 MED FILL — REPATHA 140 MG/1 ML INJ: 140 | 28 days supply | Qty: 2 | Fill #5

## 2017-06-16 MED FILL — HUMALOG 100 UNITS/ML KWIKPE: 100 | 50 days supply | Qty: 30 | Fill #2

## 2017-06-20 DIAGNOSIS — M79642 Pain in left hand: Secondary | ICD-10-CM | POA: Diagnosis not present

## 2017-06-20 DIAGNOSIS — M79641 Pain in right hand: Secondary | ICD-10-CM | POA: Diagnosis not present

## 2017-06-23 MED FILL — METOPROLOL SUCCINATE ER 25: 25 | 90 days supply | Qty: 90 | Fill #2

## 2017-06-27 ENCOUNTER — Other Ambulatory Visit: Payer: Self-pay | Admitting: Internal Medicine

## 2017-06-27 DIAGNOSIS — E7800 Pure hypercholesterolemia, unspecified: Secondary | ICD-10-CM

## 2017-06-27 DIAGNOSIS — I2510 Atherosclerotic heart disease of native coronary artery without angina pectoris: Secondary | ICD-10-CM

## 2017-06-29 MED FILL — BRILINTA 90 MG TABLET: 90 | 90 days supply | Qty: 180 | Fill #0

## 2017-06-29 NOTE — Telephone Encounter (Signed)
NEW MESSAGE      *STAT* If patient is at the pharmacy, call can be transferred to refill team.   1. Which medications need to be refilled? (please list name of each medication and dose if known) ticagrelor (BRILINTA) 90 MG TABS tablet  2. Which pharmacy/location (including street and city if local pharmacy) is medication to be sent to?[place], [place].  3. Do they need a 30 day or 90 day supply?[place]

## 2017-07-04 ENCOUNTER — Encounter: Payer: Self-pay | Admitting: Physician Assistant

## 2017-07-04 ENCOUNTER — Ambulatory Visit: Payer: 59 | Admitting: Physician Assistant

## 2017-07-04 DIAGNOSIS — I10 Essential (primary) hypertension: Secondary | ICD-10-CM | POA: Insufficient documentation

## 2017-07-04 DIAGNOSIS — I2510 Atherosclerotic heart disease of native coronary artery without angina pectoris: Secondary | ICD-10-CM

## 2017-07-04 HISTORY — DX: Atherosclerotic heart disease of native coronary artery without angina pectoris: I25.10
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
## 2017-07-07 MED FILL — REPATHA 140 MG/1 ML INJ: 140 | 28 days supply | Qty: 2 | Fill #6

## 2017-07-25 DIAGNOSIS — M6281 Muscle weakness (generalized): Secondary | ICD-10-CM | POA: Diagnosis not present

## 2017-07-25 DIAGNOSIS — M25642 Stiffness of left hand, not elsewhere classified: Secondary | ICD-10-CM | POA: Diagnosis not present

## 2017-07-25 DIAGNOSIS — M25541 Pain in joints of right hand: Secondary | ICD-10-CM | POA: Diagnosis not present

## 2017-07-25 DIAGNOSIS — M25542 Pain in joints of left hand: Secondary | ICD-10-CM | POA: Diagnosis not present

## 2017-07-26 DIAGNOSIS — M25542 Pain in joints of left hand: Secondary | ICD-10-CM | POA: Diagnosis not present

## 2017-07-26 DIAGNOSIS — M6281 Muscle weakness (generalized): Secondary | ICD-10-CM | POA: Diagnosis not present

## 2017-07-26 DIAGNOSIS — M25541 Pain in joints of right hand: Secondary | ICD-10-CM | POA: Diagnosis not present

## 2017-07-26 DIAGNOSIS — M25642 Stiffness of left hand, not elsewhere classified: Secondary | ICD-10-CM | POA: Diagnosis not present

## 2017-07-31 DIAGNOSIS — E118 Type 2 diabetes mellitus with unspecified complications: Secondary | ICD-10-CM | POA: Insufficient documentation

## 2017-07-31 DIAGNOSIS — Z794 Long term (current) use of insulin: Secondary | ICD-10-CM
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
## 2017-08-01 ENCOUNTER — Encounter: Payer: Self-pay | Admitting: Physician Assistant

## 2017-08-01 ENCOUNTER — Ambulatory Visit (INDEPENDENT_AMBULATORY_CARE_PROVIDER_SITE_OTHER): Payer: 59 | Admitting: Physician Assistant

## 2017-08-01 VITALS — BP 140/82 | HR 72 | Ht 72.0 in | Wt 244.0 lb

## 2017-08-01 DIAGNOSIS — E7800 Pure hypercholesterolemia, unspecified: Secondary | ICD-10-CM

## 2017-08-01 DIAGNOSIS — M25541 Pain in joints of right hand: Secondary | ICD-10-CM | POA: Diagnosis not present

## 2017-08-01 DIAGNOSIS — M25642 Stiffness of left hand, not elsewhere classified: Secondary | ICD-10-CM | POA: Diagnosis not present

## 2017-08-01 DIAGNOSIS — E118 Type 2 diabetes mellitus with unspecified complications: Secondary | ICD-10-CM

## 2017-08-01 DIAGNOSIS — M25542 Pain in joints of left hand: Secondary | ICD-10-CM | POA: Diagnosis not present

## 2017-08-01 DIAGNOSIS — I2510 Atherosclerotic heart disease of native coronary artery without angina pectoris: Secondary | ICD-10-CM | POA: Diagnosis not present

## 2017-08-01 DIAGNOSIS — Z794 Long term (current) use of insulin: Secondary | ICD-10-CM

## 2017-08-01 DIAGNOSIS — M6281 Muscle weakness (generalized): Secondary | ICD-10-CM | POA: Diagnosis not present

## 2017-08-01 NOTE — Patient Instructions (Signed)
Medication Instructions:  1. FINISH BRILINTA THEN STOP   Labwork: NONE ORDERED TODAY  Testing/Procedures: NONE ORDERED TODAY  Follow-Up: Your physician wants you to follow-up in: [place] DR. ROSS  You will receive a reminder letter in the mail two months in advance. If you don't receive a letter, please call our office to schedule the follow-up appointment.   Any Other Special Instructions Will Be Listed Below (If Applicable).     If you need a refill on your cardiac medications before your next appointment, please call your pharmacy.

## 2017-08-03 MED FILL — REPATHA 140 MG/1 ML INJ: 140 | 28 days supply | Qty: 2 | Fill #7

## 2017-08-04 DIAGNOSIS — M25642 Stiffness of left hand, not elsewhere classified: Secondary | ICD-10-CM | POA: Diagnosis not present

## 2017-08-04 DIAGNOSIS — M25541 Pain in joints of right hand: Secondary | ICD-10-CM | POA: Diagnosis not present

## 2017-08-04 DIAGNOSIS — M25542 Pain in joints of left hand: Secondary | ICD-10-CM | POA: Diagnosis not present

## 2017-08-04 DIAGNOSIS — M6281 Muscle weakness (generalized): Secondary | ICD-10-CM | POA: Diagnosis not present

## 2017-08-08 DIAGNOSIS — M6281 Muscle weakness (generalized): Secondary | ICD-10-CM | POA: Diagnosis not present

## 2017-08-08 DIAGNOSIS — M25541 Pain in joints of right hand: Secondary | ICD-10-CM | POA: Diagnosis not present

## 2017-08-08 DIAGNOSIS — M25542 Pain in joints of left hand: Secondary | ICD-10-CM | POA: Diagnosis not present

## 2017-08-08 DIAGNOSIS — M25642 Stiffness of left hand, not elsewhere classified: Secondary | ICD-10-CM | POA: Diagnosis not present

## 2017-08-16 MED FILL — BD PEN NDL NANO 32GX5/32": 32G X 4 MM | 28 days supply | Qty: 200 | Fill #2

## 2017-08-16 MED FILL — BD PEN NDL NANO 32GX5/32: 32G X 4 MM | 28 days supply | Qty: 200 | Fill #2

## 2017-08-17 DIAGNOSIS — M25642 Stiffness of left hand, not elsewhere classified: Secondary | ICD-10-CM | POA: Diagnosis not present

## 2017-08-17 DIAGNOSIS — M25542 Pain in joints of left hand: Secondary | ICD-10-CM | POA: Diagnosis not present

## 2017-08-17 DIAGNOSIS — M25541 Pain in joints of right hand: Secondary | ICD-10-CM | POA: Diagnosis not present

## 2017-08-17 DIAGNOSIS — M6281 Muscle weakness (generalized): Secondary | ICD-10-CM | POA: Diagnosis not present

## 2017-08-19 MED FILL — TOUJEO SOLOSTAR 300 UNITS/M: 300 | 42 days supply | Qty: 9 | Fill #0 | Status: TO

## 2017-08-31 MED FILL — HUMALOG 100 UNITS/ML KWIKPE: 100 | 50 days supply | Qty: 30 | Fill #3

## 2017-08-31 MED FILL — REPATHA 140 MG/1 ML INJ: 140 | 28 days supply | Qty: 2 | Fill #8

## 2017-09-13 ENCOUNTER — Other Ambulatory Visit: Payer: Self-pay | Admitting: Pharmacist

## 2017-09-13 MED ORDER — EVOLOCUMAB 140 MG/ML ~~LOC~~ SOAJ: 140.0000 mg | SUBCUTANEOUS | 11 refills | Status: DC

## 2017-09-21 ENCOUNTER — Other Ambulatory Visit: Payer: Self-pay | Admitting: Physician Assistant

## 2017-09-21 MED FILL — METOPROLOL SUCCINATE ER 25: 25 | 90 days supply | Qty: 90 | Fill #0

## 2017-09-26 DIAGNOSIS — E1165 Type 2 diabetes mellitus with hyperglycemia: Secondary | ICD-10-CM | POA: Diagnosis not present

## 2017-09-26 DIAGNOSIS — Z0000 Encounter for general adult medical examination without abnormal findings: Secondary | ICD-10-CM | POA: Diagnosis not present

## 2017-09-26 DIAGNOSIS — E785 Hyperlipidemia, unspecified: Secondary | ICD-10-CM | POA: Diagnosis not present

## 2017-09-26 DIAGNOSIS — Z125 Encounter for screening for malignant neoplasm of prostate: Secondary | ICD-10-CM | POA: Diagnosis not present

## 2017-09-26 MED FILL — BD PEN NDL NANO 32GX5/32": 32G X 4 MM | 28 days supply | Qty: 200 | Fill #3

## 2017-09-26 MED FILL — BD PEN NDL NANO 32GX5/32: 32G X 4 MM | 28 days supply | Qty: 200 | Fill #3

## 2017-09-26 MED FILL — REPATHA 140 MG/1 ML INJ: 140 | 28 days supply | Qty: 2 | Fill #9

## 2017-09-26 MED FILL — TOUJEO SOLOSTAR 300 UNITS/M: 300 | 42 days supply | Qty: 9 | Fill #0

## 2017-09-28 DIAGNOSIS — N529 Male erectile dysfunction, unspecified: Secondary | ICD-10-CM | POA: Diagnosis not present

## 2017-09-28 DIAGNOSIS — I2510 Atherosclerotic heart disease of native coronary artery without angina pectoris: Secondary | ICD-10-CM | POA: Diagnosis not present

## 2017-09-28 DIAGNOSIS — Z23 Encounter for immunization: Secondary | ICD-10-CM | POA: Diagnosis not present

## 2017-09-28 DIAGNOSIS — K469 Unspecified abdominal hernia without obstruction or gangrene: Secondary | ICD-10-CM | POA: Diagnosis not present

## 2017-09-28 DIAGNOSIS — Z0000 Encounter for general adult medical examination without abnormal findings: Secondary | ICD-10-CM | POA: Diagnosis not present

## 2017-09-28 DIAGNOSIS — E1165 Type 2 diabetes mellitus with hyperglycemia: Secondary | ICD-10-CM | POA: Diagnosis not present

## 2017-09-28 DIAGNOSIS — J309 Allergic rhinitis, unspecified: Secondary | ICD-10-CM | POA: Diagnosis not present

## 2017-09-28 DIAGNOSIS — E785 Hyperlipidemia, unspecified: Secondary | ICD-10-CM | POA: Diagnosis not present

## 2017-09-28 DIAGNOSIS — Z1212 Encounter for screening for malignant neoplasm of rectum: Secondary | ICD-10-CM | POA: Diagnosis not present

## 2017-09-28 DIAGNOSIS — R292 Abnormal reflex: Secondary | ICD-10-CM | POA: Diagnosis not present

## 2017-09-28 DIAGNOSIS — M2550 Pain in unspecified joint: Secondary | ICD-10-CM | POA: Diagnosis not present

## 2017-10-17 ENCOUNTER — Ambulatory Visit: Payer: Self-pay | Admitting: Surgery

## 2017-10-17 DIAGNOSIS — K439 Ventral hernia without obstruction or gangrene: Secondary | ICD-10-CM | POA: Diagnosis not present

## 2017-10-17 DIAGNOSIS — Z794 Long term (current) use of insulin: Secondary | ICD-10-CM | POA: Diagnosis not present

## 2017-10-17 DIAGNOSIS — I2510 Atherosclerotic heart disease of native coronary artery without angina pectoris: Secondary | ICD-10-CM | POA: Diagnosis not present

## 2017-10-17 DIAGNOSIS — E119 Type 2 diabetes mellitus without complications: Secondary | ICD-10-CM | POA: Diagnosis not present

## 2017-10-17 DIAGNOSIS — Z955 Presence of coronary angioplasty implant and graft: Secondary | ICD-10-CM | POA: Diagnosis not present

## 2017-10-17 NOTE — H&P (Signed)
Stephen Arnold Documented: 10/17/2017 2:33 PM Location: [facility] Patient #: [id number] DOB: February 01, 1966 Married / Language: Cleophus Molt / Race: White Male  History of Present Illness Stephen Hector MD; 10/17/2017 3:09 PM) The patient is a 52 year old male who presents with an abdominal wall hernia. Note for "Abdominal hernia": ` ` ` Patient sent for surgical consultation at the request of Dr. Shelia Media  Chief Complaint: Abdominal wall hernia above bellybutton  The patient is a pleasant active male. History of inguinal hernias. I did laparoscopic repair of recurrent left and new right inguinal hernias in June 2019. Recovered from that. Patient had episode of sneezing and coughing with his seasonal allergies. Felt a pop in his abdomen. Above his belly button. Concern for hernia. Primary care physician agreed. Surgical consultation requested. Patient did have coronary artery catheterization with stenting in January 2018 requiring Brilinta. All full anticoagulation has been stable on aspirin only. Followed by Dr. Harrington Challenger with cardiology. He can walk an hour without difficulty. Placed golf regularly. He is an insulin requiring diabetic. Initially his hemoglobin A1c was around 11. It is improved down into the 8s. He does not smoke. He had unintentionally gained about 30 pounds since I saw him 10 years ago. He started to lose weight again. Moving his bowels every morning.  (Review of systems as stated in this history (HPI) or in the review of systems. Otherwise all other 12 point ROS are negative) ` ` `   Past Surgical History (Tanisha A. Owens Shark, [place]; 10/17/2017 2:33 PM) Knee Surgery Bilateral. Open Inguinal Hernia Surgery Bilateral. Shoulder Surgery Bilateral.  Diagnostic Studies History (Tanisha A. Owens Shark, [place]; 10/17/2017 2:33 PM) Colonoscopy never  Allergies (Tanisha A. Owens Shark, RMA; 10/17/2017 2:36 PM) CIPROFLOXACIN Chest pain. Valium *ANTIANXIETY AGENTS* Allergies  Reconciled  Medication History (Tanisha A. Owens Shark, [place]; 10/17/2017 2:36 PM) Kary Kos (90MG  Tablet, Oral) Active. HumaLOG KwikPen (100UNIT/ML Soln Pen-inj, Subcutaneous) Active. Metoprolol Succinate ER (25MG  Tablet ER 24HR, Oral) Active. Repatha SureClick (140MG /ML Soln Auto-inj, Subcutaneous) Active. Toujeo SoloStar (300UNIT/ML Soln Pen-inj, Subcutaneous) Active. Medications Reconciled  Social History (Tanisha A. Owens Shark, [place]; 10/17/2017 2:33 PM) Alcohol use Moderate alcohol use. Caffeine use Coffee. No drug use Tobacco use Never smoker.  Family History (Tanisha A. Owens Shark, RMA; 10/17/2017 2:33 PM) Breast Cancer Sister. Colon Polyps Mother. Diabetes Mellitus Family Members In General, Father. Heart Disease Mother. Heart disease in male family member before age 66 Hypertension Mother.  Other Problems (Tanisha A. Owens Shark, [place]; 10/17/2017 2:33 PM) Arthritis Diabetes Mellitus Inguinal Hernia Other disease, cancer, significant illness Pancreatitis     Review of Systems (Tanisha A. Brown RMA; 10/17/2017 2:33 PM) General Not Present- Appetite Loss, Chills, Fatigue, Fever, Night Sweats, Weight Gain and Weight Loss. Skin Not Present- Change in Wart/Mole, Dryness, Hives, Jaundice, New Lesions, Non-Healing Wounds, Rash and Ulcer. HEENT Present- Seasonal Allergies and Wears glasses/contact lenses. Not Present- Earache, Hearing Loss, Hoarseness, Nose Bleed, Oral Ulcers, Ringing in the Ears, Sinus Pain, Sore Throat, Visual Disturbances and Yellow Eyes. Respiratory Present- Snoring. Not Present- Bloody sputum, Chronic Cough, Difficulty Breathing and Wheezing. Breast Not Present- Breast Mass, Breast Pain, Nipple Discharge and Skin Changes. Cardiovascular Not Present- Chest Pain, Difficulty Breathing Lying Down, Leg Cramps, Palpitations, Rapid Heart Rate, Shortness of Breath and Swelling of Extremities. Gastrointestinal Not Present- Abdominal Pain, Bloating, Bloody Stool, Change in Bowel  Habits, Chronic diarrhea, Constipation, Difficulty Swallowing, Excessive gas, Gets full quickly at meals, Hemorrhoids, Indigestion, Nausea, Rectal Pain and Vomiting. Male Genitourinary Not Present- Blood in Urine, Change in Urinary  Stream, Frequency, Impotence, Nocturia, Painful Urination, Urgency and Urine Leakage. Musculoskeletal Present- Joint Pain, Joint Stiffness and Swelling of Extremities. Not Present- Back Pain, Muscle Pain and Muscle Weakness. Neurological Not Present- Decreased Memory, Fainting, Headaches, Numbness, Seizures, Tingling, Tremor, Trouble walking and Weakness. Psychiatric Not Present- Anxiety, Bipolar, Change in Sleep Pattern, Depression, Fearful and Frequent crying. Endocrine Not Present- Cold Intolerance, Excessive Hunger, Hair Changes, Heat Intolerance, Hot flashes and New Diabetes. Hematology Not Present- Blood Thinners, Easy Bruising, Excessive bleeding, Gland problems, HIV and Persistent Infections.  Vitals (Tanisha A. Brown RMA; 10/17/2017 2:34 PM) 10/17/2017 2:33 PM Weight: 242.4 lb Height: 71in Body Surface Area: 2.29 m Body Mass Index: 33.81 kg/m  Temp.: 98.78F  Pulse: 84 (Regular)  BP: 126/82 (Sitting, Left Arm, Standard)      Assessment & Plan Stephen Hector MD; 10/17/2017 2:58 PM)  VENTRAL HERNIA WITHOUT OBSTRUCTION OR GANGRENE (K43.9) Impression: Evidence of supraumbilical 4 x 3 cm mass also reducible consistent with new ventral hernia. Not at site of prior infraumbilical incision. No evidence of any inguinal hernia recurrence.  While it is small, it is symptomatic. Persisting. I think he would benefit from surgical repair. He does have a history cortices and stenting but has been off his lung thinners in its been almost a year and a half since then. Doing well. Diabetes insulin requiring better under control. Most likely just need to check with cardiology to make sure it safe to proceed with surgery. He's had numerous orthopedic surgeries  without incident since then.   PREOP - [place] - ENCOUNTER FOR PREOPERATIVE EXAMINATION FOR GENERAL SURGICAL PROCEDURE (Z01.818)  Current Plans You are being scheduled for surgery- Our schedulers will call you.  You should hear from our office's scheduling department within 5 working days about the location, date, and time of surgery. We try to make accommodations for patient's preferences in scheduling surgery, but sometimes the OR schedule or the surgeon's schedule prevents [place] from making those accommodations.  If you have not heard from our office [phone]) in 5 working days, call the office and ask for your surgeon's nurse.  If you have other questions about your diagnosis, plan, or surgery, call the office and ask for your surgeon's nurse.  Written instructions provided The anatomy & physiology of the abdominal wall was discussed. The pathophysiology of hernias was discussed. Natural history risks without surgery including progeressive enlargement, pain, incarceration, & strangulation was discussed. Contributors to complications such as smoking, obesity, diabetes, prior surgery, etc were discussed.  I feel the risks of no intervention will lead to serious problems that outweigh the operative risks; therefore, I recommended surgery to reduce and repair the hernia. I explained laparoscopic techniques with possible need for an open approach. I noted the probable use of mesh to patch and/or buttress the hernia repair  Risks such as bleeding, infection, abscess, need for further treatment, heart attack, death, and other risks were discussed. I noted a good likelihood this will help address the problem. Goals of post-operative recovery were discussed as well. Possibility that this will not correct all symptoms was explained. I stressed the importance of low-impact activity, aggressive pain control, avoiding constipation, & not pushing through pain to minimize risk of post-operative  chronic pain or injury. Possibility of reherniation especially with smoking, obesity, diabetes, immunosuppression, and other health conditions was discussed. We will work to minimize complications.  An educational handout further explaining the pathology & treatment options was given as well. Questions were answered. The patient expresses understanding &  wishes to proceed with surgery.  Pt Education - CCS Hernia Post-Op HCI (Chanita Boden): discussed with patient and provided information. Pt Education - CCS Pain Control (Jakiyah Stepney) Pt Education - Pamphlet Given - Laparoscopic Hernia Repair: discussed with patient and provided information. Pt Education - CCS Mesh education: discussed with patient and provided information.  PRESENCE OF STENT IN CORONARY ARTERY IN PATIENT WITH CORONARY ARTERY DISEASE (I25.10) Impression: History of coronary artery disease requiring angiography and stenting January 2018. Finished 1 year Brilinta & now just on 81 mg aspirin. Good exercise tolerance.  Most likely will be safe to proceed with surgery. I'm okay with continuing aspirin. Cardiac clearance just, to make sure he is okay. Last seen in April 2019 and was doing well  Current Plans I recommended obtaining preoperative cardiac clearance. I am concerned about the health of the patient and the ability to tolerate the operation. Therefore, we will request clearance by cardiology to better assess operative risk & see if a reevaluation, further workup, etc is needed. Also recommendations on how medications such as for anticoagulation and blood pressure should be managed/held/restarted after surgery.  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 Now in the 8s according to him.  Stephen Hector, MD, FACS, MASCRS Gastrointestinal and Minimally Invasive Surgery    [address] [place], [place] [address] [phone] Main / Paging [phone] Fax

## 2017-10-19 ENCOUNTER — Telehealth: Payer: Self-pay

## 2017-10-19 NOTE — Telephone Encounter (Signed)
   [facility] Pre-operative Risk Assessment    Request for surgical clearance:  1. What type of surgery is being performed? Lap Ventral Wall Hernia Repair w/ mesh  2. When is this surgery scheduled? TBD  3. What type of clearance is required (medical clearance vs. Pharmacy clearance to hold med vs. Both)? both  4. Are there any medications that need to be held prior to surgery and how long? None listed   5. Practice name and name of physician performing surgery? [facility] Dr. Michael Boston   6. What is your office phone number [phone]    7.   What is your office fax number [phone] Attn: Illene Regulus, CMA  8.   Anesthesia type (None, local, MAC, general) ? General     _________________________________________________________________   (provider comments below)

## 2017-10-21 NOTE — Telephone Encounter (Signed)
   Primary Cardiologist: Dorris Carnes, MD  Chart reviewed as part of pre-operative protocol coverage. Given past medical history, my interview with the patient today over the phone, and time since last visit, based on ACC/AHA guidelines, DEROY NOAH would be at acceptable risk for the planned procedure without further cardiovascular testing.   I will route this recommendation to the requesting party via Epic fax function and remove from pre-op pool.  Please call with questions.  Kerin Ransom, PA-C 10/21/2017, 10:24 AM

## 2017-11-07 MED FILL — HUMALOG 100 UNITS/ML KWIKPE: 100 | 50 days supply | Qty: 30 | Fill #4

## 2017-11-07 MED FILL — TOUJEO SOLOSTAR 300 UNITS/M: 300 | 42 days supply | Qty: 9 | Fill #1

## 2017-11-07 MED FILL — REPATHA 140 MG/1 ML INJ: 140 | 28 days supply | Qty: 2 | Fill #10

## 2017-12-13 MED FILL — TOUJEO SOLOSTAR 300 UNITS/M: 300 | 42 days supply | Qty: 9 | Fill #2

## 2017-12-13 MED FILL — METOPROLOL SUCCINATE ER 25: 25 | 90 days supply | Qty: 90 | Fill #1

## 2017-12-13 MED FILL — REPATHA 140 MG/1 ML INJ: 140 | 28 days supply | Qty: 2 | Fill #11

## 2017-12-23 MED FILL — BD PEN NDL NANO 32GX5/32": 32G X 4 MM | 28 days supply | Qty: 200 | Fill #4

## 2017-12-23 MED FILL — BD PEN NDL NANO 32GX5/32: 32G X 4 MM | 28 days supply | Qty: 200 | Fill #4

## 2018-01-05 MED FILL — HUMALOG 100 UNITS/ML KWIKPE: 100 | 50 days supply | Qty: 30 | Fill #5

## 2018-01-17 ENCOUNTER — Encounter (HOSPITAL_BASED_OUTPATIENT_CLINIC_OR_DEPARTMENT_OTHER): Payer: Self-pay

## 2018-01-18 ENCOUNTER — Other Ambulatory Visit: Payer: Self-pay

## 2018-01-18 ENCOUNTER — Encounter (HOSPITAL_BASED_OUTPATIENT_CLINIC_OR_DEPARTMENT_OTHER): Payer: Self-pay

## 2018-01-18 NOTE — Pre-Procedure Instructions (Signed)
Stephen Arnold complained that when we ask him to take 1/2 Toujeo the evening before he's blood sugars are very high when he comes in for surgery.  I spoke with Dr. Marcie Bal and he suggested Stephen Arnold take 3/4 of his Toujeo the evening before surgery.  Stephen Arnold verbalized understanding.

## 2018-01-18 NOTE — Progress Notes (Signed)
Spoke with:  Stephen Arnold NPO:  After Midnight, no gum, candy, or mints   Arrival time: 0530AM Labs: Istat 4 (EKG and cardiac clearance chart/epic) AM medications: Metoprolol Pre op orders:  No left message for [place] home:  Maudie Mercury (wife) [phone]

## 2018-01-19 ENCOUNTER — Encounter (HOSPITAL_BASED_OUTPATIENT_CLINIC_OR_DEPARTMENT_OTHER): Payer: Self-pay | Admitting: Certified Registered"

## 2018-01-19 ENCOUNTER — Ambulatory Visit: Payer: Self-pay | Admitting: Surgery

## 2018-01-19 NOTE — Anesthesia Preprocedure Evaluation (Addendum)
Anesthesia Evaluation  Patient identified by MRN, date of birth, ID band Patient awake    Reviewed: Allergy & Precautions, NPO status , Patient's Chart, lab work & pertinent test results  Airway Mallampati: I       Dental no notable dental hx. (+) Teeth Intact, Dental Advisory Given   Pulmonary neg pulmonary ROS,    Pulmonary exam normal breath sounds clear to auscultation       Cardiovascular hypertension, Pt. on home beta blockers + CAD  Normal cardiovascular exam Rhythm:Regular Rate:Normal     Neuro/Psych negative neurological ROS  negative psych ROS   GI/Hepatic negative GI ROS, Neg liver ROS,   Endo/Other  diabetes  Renal/GU negative Renal ROS  negative genitourinary   Musculoskeletal   Abdominal (+) + obese,   Peds  Hematology  (+) anemia ,   Anesthesia Other Findings   Reproductive/Obstetrics                           Anesthesia Physical Anesthesia Plan  ASA: II  Anesthesia Plan: General   Post-op Pain Management:    Induction: Intravenous  PONV Risk Score and Plan: 4 or greater and Ondansetron, Dexamethasone and Midazolam  Airway Management Planned: Oral ETT  Additional Equipment:   Intra-op Plan:   Post-operative Plan: Extubation in OR  Informed Consent: I have reviewed the patients History and Physical, chart, labs and discussed the procedure including the risks, benefits and alternatives for the proposed anesthesia with the patient or authorized representative who has indicated his/her understanding and acceptance.   Dental advisory given  Plan Discussed with: CRNA and Surgeon  Anesthesia Plan Comments:        Anesthesia Quick Evaluation

## 2018-01-20 ENCOUNTER — Encounter (HOSPITAL_BASED_OUTPATIENT_CLINIC_OR_DEPARTMENT_OTHER)
Admission: RE | Disposition: A | Discharge status: Home / Self Care | Payer: Self-pay | Source: Ambulatory Visit | Attending: Surgery

## 2018-01-20 ENCOUNTER — Ambulatory Visit (HOSPITAL_BASED_OUTPATIENT_CLINIC_OR_DEPARTMENT_OTHER): Payer: 59 | Admitting: Certified Registered"

## 2018-01-20 ENCOUNTER — Ambulatory Visit (HOSPITAL_BASED_OUTPATIENT_CLINIC_OR_DEPARTMENT_OTHER)
Admission: RE | Admit: 2018-01-20 | Discharge: 2018-01-20 | Disposition: A | Discharge status: Home / Self Care | Payer: 59 | Source: Ambulatory Visit | Attending: Surgery | Admitting: Surgery

## 2018-01-20 ENCOUNTER — Encounter (HOSPITAL_BASED_OUTPATIENT_CLINIC_OR_DEPARTMENT_OTHER): Payer: Self-pay

## 2018-01-20 DIAGNOSIS — Z6833 Body mass index (BMI) 33.0-33.9, adult: Secondary | ICD-10-CM | POA: Insufficient documentation

## 2018-01-20 DIAGNOSIS — I10 Essential (primary) hypertension: Secondary | ICD-10-CM | POA: Insufficient documentation

## 2018-01-20 DIAGNOSIS — Z794 Long term (current) use of insulin: Secondary | ICD-10-CM | POA: Insufficient documentation

## 2018-01-20 DIAGNOSIS — E119 Type 2 diabetes mellitus without complications: Secondary | ICD-10-CM | POA: Diagnosis not present

## 2018-01-20 DIAGNOSIS — E785 Hyperlipidemia, unspecified: Secondary | ICD-10-CM | POA: Insufficient documentation

## 2018-01-20 DIAGNOSIS — I2510 Atherosclerotic heart disease of native coronary artery without angina pectoris: Secondary | ICD-10-CM | POA: Diagnosis not present

## 2018-01-20 DIAGNOSIS — Z7982 Long term (current) use of aspirin: Secondary | ICD-10-CM | POA: Diagnosis not present

## 2018-01-20 DIAGNOSIS — K430 Incisional hernia with obstruction, without gangrene: Secondary | ICD-10-CM | POA: Insufficient documentation

## 2018-01-20 DIAGNOSIS — E669 Obesity, unspecified: Secondary | ICD-10-CM | POA: Diagnosis not present

## 2018-01-20 DIAGNOSIS — Z955 Presence of coronary angioplasty implant and graft: Secondary | ICD-10-CM | POA: Diagnosis not present

## 2018-01-20 HISTORY — DX: Personal history of other diseases of the digestive system: Z87.19

## 2018-01-20 HISTORY — PX: INSERTION OF MESH: SHX5868

## 2018-01-20 HISTORY — DX: Fatty (change of) liver, not elsewhere classified: K76.0

## 2018-01-20 HISTORY — DX: Diverticulitis of intestine, part unspecified, without perforation or abscess without bleeding: K57.92

## 2018-01-20 HISTORY — PX: VENTRAL HERNIA REPAIR: SHX424

## 2018-01-20 HISTORY — DX: Atherosclerosis of aorta: I70.0

## 2018-01-20 HISTORY — DX: Presence of spectacles and contact lenses: Z97.3

## 2018-01-20 HISTORY — DX: Unspecified abdominal hernia without obstruction or gangrene: K46.9

## 2018-01-20 LAB — POCT I-STAT 4, (NA,K, GLUC, HGB,HCT)
Glucose, Bld: 196 mg/dL — ABNORMAL HIGH (ref 70–99)
HCT: 37 % — ABNORMAL LOW (ref 39.0–52.0)
Hemoglobin: 12.6 g/dL — ABNORMAL LOW (ref 13.0–17.0)
Potassium: 3.9 mmol/L (ref 3.5–5.1)
SODIUM: 141 mmol/L (ref 135–145)

## 2018-01-20 LAB — GLUCOSE, CAPILLARY
Glucose-Capillary: 254 mg/dL — ABNORMAL HIGH (ref 70–99)
Glucose-Capillary: 191 mg/dL — ABNORMAL HIGH (ref 70–99)

## 2018-01-20 SURGERY — REPAIR, HERNIA, VENTRAL, LAPAROSCOPIC
Anesthesia: General | Site: Abdomen

## 2018-01-20 MED ORDER — CELECOXIB 200 MG PO CAPS
ORAL_CAPSULE | ORAL | Status: AC
  Filled 2018-01-20: qty 1

## 2018-01-20 MED ORDER — LIDOCAINE 2% (20 MG/ML) 5 ML SYRINGE
INTRAMUSCULAR | Status: DC | PRN
  Administered 2018-01-20: 1.5 mg/kg/h via INTRAVENOUS

## 2018-01-20 MED ORDER — ROCURONIUM BROMIDE 10 MG/ML (PF) SYRINGE
PREFILLED_SYRINGE | INTRAVENOUS | Status: DC | PRN
  Administered 2018-01-20: 15 mg via INTRAVENOUS
  Administered 2018-01-20: 10 mg via INTRAVENOUS
  Administered 2018-01-20: 35 mg via INTRAVENOUS

## 2018-01-20 MED ORDER — KETOROLAC TROMETHAMINE 30 MG/ML IJ SOLN
30.0000 mg | Freq: Once | INTRAMUSCULAR | Status: DC | PRN
  Filled 2018-01-20: qty 1

## 2018-01-20 MED ORDER — ACETAMINOPHEN 325 MG PO TABS
325.0000 mg | ORAL_TABLET | ORAL | Status: DC | PRN
  Filled 2018-01-20: qty 2

## 2018-01-20 MED ORDER — PHENYLEPHRINE 40 MCG/ML (10ML) SYRINGE FOR IV PUSH (FOR BLOOD PRESSURE SUPPORT)
PREFILLED_SYRINGE | INTRAVENOUS | Status: DC | PRN
  Administered 2018-01-20 (×2): 80 ug via INTRAVENOUS
  Administered 2018-01-20: 160 ug via INTRAVENOUS
  Administered 2018-01-20 (×2): 80 ug via INTRAVENOUS

## 2018-01-20 MED ORDER — INSULIN ASPART 100 UNIT/ML ~~LOC~~ SOLN
20.0000 [IU] | Freq: Once | SUBCUTANEOUS | Status: AC
  Administered 2018-01-20: 20 [IU] via SUBCUTANEOUS
  Filled 2018-01-20: qty 0.2

## 2018-01-20 MED ORDER — GABAPENTIN 300 MG PO CAPS
300.0000 mg | ORAL_CAPSULE | ORAL | Status: AC
  Administered 2018-01-20: 300 mg via ORAL
  Filled 2018-01-20: qty 1

## 2018-01-20 MED ORDER — GABAPENTIN 300 MG PO CAPS
ORAL_CAPSULE | ORAL | Status: AC
  Filled 2018-01-20: qty 1

## 2018-01-20 MED ORDER — CEFAZOLIN SODIUM-DEXTROSE 2-4 GM/100ML-% IV SOLN
2.0000 g | INTRAVENOUS | Status: AC
  Administered 2018-01-20 (×2): 2 g via INTRAVENOUS
  Filled 2018-01-20: qty 100

## 2018-01-20 MED ORDER — FENTANYL CITRATE (PF) 250 MCG/5ML IJ SOLN
INTRAMUSCULAR | Status: AC
  Filled 2018-01-20: qty 5

## 2018-01-20 MED ORDER — BUPIVACAINE LIPOSOME 1.3 % IJ SUSP
INTRAMUSCULAR | Status: DC | PRN
  Administered 2018-01-20: 20 mL

## 2018-01-20 MED ORDER — ONDANSETRON HCL 4 MG/2ML IJ SOLN
4.0000 mg | Freq: Once | INTRAMUSCULAR | Status: DC | PRN
  Filled 2018-01-20: qty 2

## 2018-01-20 MED ORDER — SUCCINYLCHOLINE CHLORIDE 200 MG/10ML IV SOSY
PREFILLED_SYRINGE | INTRAVENOUS | Status: DC | PRN
  Administered 2018-01-20: 100 mg via INTRAVENOUS

## 2018-01-20 MED ORDER — PROPOFOL 10 MG/ML IV BOLUS
INTRAVENOUS | Status: DC | PRN
  Administered 2018-01-20: 200 mg via INTRAVENOUS

## 2018-01-20 MED ORDER — PROPOFOL 10 MG/ML IV BOLUS
INTRAVENOUS | Status: AC
  Filled 2018-01-20: qty 20

## 2018-01-20 MED ORDER — ACETAMINOPHEN 500 MG PO TABS
1000.0000 mg | ORAL_TABLET | ORAL | Status: AC
  Administered 2018-01-20: 1000 mg via ORAL
  Filled 2018-01-20: qty 2

## 2018-01-20 MED ORDER — CEFAZOLIN SODIUM-DEXTROSE 2-4 GM/100ML-% IV SOLN
INTRAVENOUS | Status: AC
  Filled 2018-01-20: qty 100

## 2018-01-20 MED ORDER — OXYCODONE HCL 5 MG PO TABS
ORAL_TABLET | ORAL | Status: AC
  Filled 2018-01-20: qty 1

## 2018-01-20 MED ORDER — EPHEDRINE SULFATE-NACL 50-0.9 MG/10ML-% IV SOSY
PREFILLED_SYRINGE | INTRAVENOUS | Status: DC | PRN
  Administered 2018-01-20: 10 mg via INTRAVENOUS

## 2018-01-20 MED ORDER — MIDAZOLAM HCL 2 MG/2ML IJ SOLN
INTRAMUSCULAR | Status: DC | PRN
  Administered 2018-01-20: 2 mg via INTRAVENOUS

## 2018-01-20 MED ORDER — OXYCODONE HCL 5 MG/5ML PO SOLN
5.0000 mg | Freq: Once | ORAL | Status: AC | PRN
  Filled 2018-01-20: qty 5

## 2018-01-20 MED ORDER — OXYCODONE HCL 5 MG PO TABS: 5.0000 mg | ORAL_TABLET | Freq: Four times a day (QID) | ORAL | 0 refills | Status: DC | PRN

## 2018-01-20 MED ORDER — MEPERIDINE HCL 25 MG/ML IJ SOLN
6.2500 mg | INTRAMUSCULAR | Status: DC | PRN
  Filled 2018-01-20: qty 1

## 2018-01-20 MED ORDER — BUPIVACAINE LIPOSOME 1.3 % IJ SUSP
20.0000 mL | Freq: Once | INTRAMUSCULAR | Status: DC
  Filled 2018-01-20: qty 20

## 2018-01-20 MED ORDER — SUGAMMADEX SODIUM 200 MG/2ML IV SOLN
INTRAVENOUS | Status: DC | PRN
  Administered 2018-01-20: 300 mg via INTRAVENOUS

## 2018-01-20 MED ORDER — ACETAMINOPHEN 160 MG/5ML PO SOLN
325.0000 mg | ORAL | Status: DC | PRN
  Filled 2018-01-20: qty 20.3

## 2018-01-20 MED ORDER — FENTANYL CITRATE (PF) 100 MCG/2ML IJ SOLN
25.0000 ug | INTRAMUSCULAR | Status: DC | PRN
  Filled 2018-01-20: qty 1

## 2018-01-20 MED ORDER — ACETAMINOPHEN 500 MG PO TABS
ORAL_TABLET | ORAL | Status: AC
  Filled 2018-01-20: qty 2

## 2018-01-20 MED ORDER — LACTATED RINGERS IV SOLN
INTRAVENOUS | Status: DC
  Administered 2018-01-20 (×2): via INTRAVENOUS
  Filled 2018-01-20: qty 1000

## 2018-01-20 MED ORDER — OXYCODONE HCL 5 MG PO TABS
5.0000 mg | ORAL_TABLET | Freq: Once | ORAL | Status: AC | PRN
  Administered 2018-01-20: 5 mg via ORAL
  Filled 2018-01-20: qty 1

## 2018-01-20 MED ORDER — FENTANYL CITRATE (PF) 250 MCG/5ML IJ SOLN
INTRAMUSCULAR | Status: DC | PRN
  Administered 2018-01-20 (×3): 50 ug via INTRAVENOUS
  Administered 2018-01-20: 100 ug via INTRAVENOUS
  Administered 2018-01-20 (×3): 50 ug via INTRAVENOUS

## 2018-01-20 MED ORDER — BUPIVACAINE-EPINEPHRINE 0.25% -1:200000 IJ SOLN
INTRAMUSCULAR | Status: DC | PRN
  Administered 2018-01-20: 60 mL

## 2018-01-20 MED ORDER — GABAPENTIN 300 MG PO CAPS: 300.0000 mg | ORAL_CAPSULE | Freq: Two times a day (BID) | ORAL | 1 refills | Status: DC

## 2018-01-20 MED ORDER — DEXAMETHASONE SODIUM PHOSPHATE 10 MG/ML IJ SOLN
INTRAMUSCULAR | Status: DC | PRN
  Administered 2018-01-20: 4 mg via INTRAVENOUS

## 2018-01-20 MED ORDER — ONDANSETRON HCL 4 MG/2ML IJ SOLN
INTRAMUSCULAR | Status: DC | PRN
  Administered 2018-01-20: 4 mg via INTRAVENOUS

## 2018-01-20 MED ORDER — INSULIN ASPART 100 UNIT/ML ~~LOC~~ SOLN
SUBCUTANEOUS | Status: AC
  Filled 2018-01-20: qty 1

## 2018-01-20 MED ORDER — LIDOCAINE 2% (20 MG/ML) 5 ML SYRINGE
INTRAMUSCULAR | Status: DC | PRN
  Administered 2018-01-20: 80 mg via INTRAVENOUS

## 2018-01-20 MED ORDER — CELECOXIB 200 MG PO CAPS
200.0000 mg | ORAL_CAPSULE | ORAL | Status: AC
  Administered 2018-01-20: 200 mg via ORAL
  Filled 2018-01-20: qty 1

## 2018-01-20 MED ORDER — MIDAZOLAM HCL 2 MG/2ML IJ SOLN
INTRAMUSCULAR | Status: AC
  Filled 2018-01-20: qty 2

## 2018-01-20 MED FILL — REPATHA 140 MG/1 ML INJ: 140 | 28 days supply | Qty: 2 | Fill #0

## 2018-01-20 MED FILL — oxyCODONE HCL 5 MG TABS: 5 | 4 days supply | Qty: 30 | Fill #0

## 2018-01-20 MED FILL — GABAPENTIN 300 MG CAPSULE: 300 | 15 days supply | Qty: 60 | Fill #0

## 2018-01-20 SURGICAL SUPPLY — 59 items
APPLIER CLIP 5 13 M/L LIGAMAX5 (MISCELLANEOUS)
APR CLP MED LRG 5 ANG JAW (MISCELLANEOUS)
BINDER ABDOMINAL 12 ML 46-62 (SOFTGOODS) IMPLANT
CABLE HIGH FREQUENCY MONO STRZ (ELECTRODE) ×3 IMPLANT
CANISTER SUCT 3000ML PPV (MISCELLANEOUS) IMPLANT
CHLORAPREP W/TINT 26ML (MISCELLANEOUS) ×3 IMPLANT
CLIP APPLIE 5 13 M/L LIGAMAX5 (MISCELLANEOUS) IMPLANT
CLOSURE WOUND 1/2 X4 (GAUZE/BANDAGES/DRESSINGS) ×1
DECANTER SPIKE VIAL GLASS SM (MISCELLANEOUS) ×3 IMPLANT
DEVICE SECURE STRAP 25 ABSORB (INSTRUMENTS) ×3 IMPLANT
DEVICE TROCAR PUNCTURE CLOSURE (ENDOMECHANICALS) ×3 IMPLANT
DRAPE WARM FLUID 44X44 (DRAPE) ×3 IMPLANT
DRSG TEGADERM 2-3/8X2-3/4 SM (GAUZE/BANDAGES/DRESSINGS) ×9 IMPLANT
DRSG TEGADERM 4X4.75 (GAUZE/BANDAGES/DRESSINGS) ×3 IMPLANT
ELECT REM PT RETURN 9FT ADLT (ELECTROSURGICAL) ×3
ELECTRODE REM PT RTRN 9FT ADLT (ELECTROSURGICAL) ×1 IMPLANT
GLOVE BIO SURGEON STRL SZ7 (GLOVE) ×3 IMPLANT
GLOVE BIO SURGEON STRL SZ7.5 (GLOVE) ×3 IMPLANT
GLOVE BIOGEL PI IND STRL 6.5 (GLOVE) ×1 IMPLANT
GLOVE BIOGEL PI IND STRL 7.0 (GLOVE) ×1 IMPLANT
GLOVE BIOGEL PI IND STRL 7.5 (GLOVE) ×1 IMPLANT
GLOVE BIOGEL PI INDICATOR 6.5 (GLOVE) ×2
GLOVE BIOGEL PI INDICATOR 7.0 (GLOVE) ×2
GLOVE BIOGEL PI INDICATOR 7.5 (GLOVE) ×2
GLOVE ECLIPSE 8.0 STRL XLNG CF (GLOVE) ×3 IMPLANT
GLOVE INDICATOR 8.0 STRL GRN (GLOVE) ×3 IMPLANT
GOWN STRL REUS W/TWL LRG LVL3 (GOWN DISPOSABLE) ×9 IMPLANT
GOWN STRL REUS W/TWL XL LVL3 (GOWN DISPOSABLE) ×6 IMPLANT
IRRIG SUCT STRYKERFLOW 2 WTIP (MISCELLANEOUS) ×3
IRRIGATION SUCT STRKRFLW 2 WTP (MISCELLANEOUS) ×1 IMPLANT
KIT TURNOVER CYSTO (KITS) ×3 IMPLANT
MANIFOLD NEPTUNE II (INSTRUMENTS) ×3 IMPLANT
MESH VENTRALIGHT ST 6X8 (Mesh Specialty) ×3 IMPLANT
MESH VENTRLGHT ELLIPSE 8X6XMFL (Mesh Specialty) ×1 IMPLANT
NEEDLE HYPO 22GX1.5 SAFETY (NEEDLE) ×6 IMPLANT
NEEDLE SPNL 22GX3.5 QUINCKE BK (NEEDLE) IMPLANT
NS IRRIG 500ML POUR BTL (IV SOLUTION) ×3 IMPLANT
PACK BASIN DAY SURGERY FS (CUSTOM PROCEDURE TRAY) ×3 IMPLANT
PAD POSITIONING PINK XL (MISCELLANEOUS) ×6 IMPLANT
SCISSORS LAP 5X35 DISP (ENDOMECHANICALS) ×3 IMPLANT
SHEARS HARMONIC ACE PLUS 36CM (ENDOMECHANICALS) IMPLANT
SLEEVE ADV FIXATION 5X100MM (TROCAR) ×3 IMPLANT
SPONGE GAUZE 2X2 8PLY STER LF (GAUZE/BANDAGES/DRESSINGS) ×1
SPONGE GAUZE 2X2 8PLY STRL LF (GAUZE/BANDAGES/DRESSINGS) ×2 IMPLANT
STRIP CLOSURE SKIN 1/2X4 (GAUZE/BANDAGES/DRESSINGS) ×2 IMPLANT
SUT MNCRL AB 4-0 PS2 18 (SUTURE) ×6 IMPLANT
SUT PDS AB 1 CT1 27 (SUTURE) ×12 IMPLANT
SUT PROLENE 1 CT 1 30 (SUTURE) ×18 IMPLANT
SUT VIC AB 2-0 SH 27 (SUTURE)
SUT VIC AB 2-0 SH 27XBRD (SUTURE) IMPLANT
SUT VIC AB 3-0 SH 27 (SUTURE)
SUT VIC AB 3-0 SH 27X BRD (SUTURE) IMPLANT
TOWEL OR 17X24 6PK STRL BLUE (TOWEL DISPOSABLE) ×3 IMPLANT
TRAY LAPAROSCOPIC (CUSTOM PROCEDURE TRAY) ×9 IMPLANT
TROCAR ADV FIXATION 11X100MM (TROCAR) IMPLANT
TROCAR ADV FIXATION 5X100MM (TROCAR) ×3 IMPLANT
TROCAR BLADELESS OPT 5 100 (ENDOMECHANICALS) ×3 IMPLANT
TUBING INSUF HEATED (TUBING) ×3 IMPLANT
WATER STERILE IRR 500ML POUR (IV SOLUTION) ×3 IMPLANT
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
## 2018-01-20 NOTE — H&P (Signed)
Stephen Arnold DOB: Jun 14, 1965  Patient Care Team: Thressa Sheller, MD as PCP - General (Internal Medicine) Fay Records, MD as PCP - Cardiology (Cardiology) Michael Boston, MD as Consulting Physician (General Surgery)  ` Patient sent for surgical consultation at the request of Dr. Shelia Media  Chief Complaint: Abdominal wall hernia above bellybutton  The patient is a pleasant active male. History of inguinal hernias. I did laparoscopic repair of recurrent left and new right inguinal hernias in June 2019. Recovered from that. Patient had episode of sneezing and coughing with his seasonal allergies. Felt a pop in his abdomen. Above his belly button. Concern for hernia. Primary care physician agreed. Surgical consultation requested. Patient did have coronary artery catheterization with stenting in January 2018 requiring Brilinta. All full anticoagulation has been stable on aspirin only. Followed by Dr. Harrington Challenger with cardiology. He can walk an hour without difficulty. Placed golf regularly. He is an insulin requiring diabetic. Initially his hemoglobin A1c was around 11. It is improved down into the 8s. He does not smoke. He had unintentionally gained about 30 pounds since I saw him 10 years ago. He started to lose weight again. Moving his bowels every morning.  Cleared by cardiology.  Ready for surgery  (Review of systems as stated in this history (HPI) or in the review of systems. Otherwise all other 12 point ROS are negative) ` ` `   Past Surgical History (Tanisha A. Owens Shark, [place]; 10/17/2017 2:33 PM) Knee Surgery Bilateral. Open Inguinal Hernia Surgery Bilateral. Shoulder Surgery Bilateral.  Diagnostic Studies History (Tanisha A. Owens Shark, [place]; 10/17/2017 2:33 PM) Colonoscopy never  Allergies (Tanisha A. Owens Shark, RMA; 10/17/2017 2:36 PM) CIPROFLOXACIN Chest pain. Valium *ANTIANXIETY AGENTS* Allergies Reconciled  Medication History (Tanisha A. Owens Shark, RMA; 10/17/2017  2:36 PM) Kary Kos (90MG  Tablet, Oral) Active. HumaLOG KwikPen (100UNIT/ML Soln Pen-inj, Subcutaneous) Active. Metoprolol Succinate ER (25MG  Tablet ER 24HR, Oral) Active. Repatha SureClick (140MG /ML Soln Auto-inj, Subcutaneous) Active. Toujeo SoloStar (300UNIT/ML Soln Pen-inj, Subcutaneous) Active. Medications Reconciled  Social History (Tanisha A. Owens Shark, [place]; 10/17/2017 2:33 PM) Alcohol use Moderate alcohol use. Caffeine use Coffee. No drug use Tobacco use Never smoker.  Family History (Tanisha A. Owens Shark, RMA; 10/17/2017 2:33 PM) Breast Cancer Sister. Colon Polyps Mother. Diabetes Mellitus Family Members In General, Father. Heart Disease Mother. Heart disease in male family member before age 64 Hypertension Mother.  Other Problems (Tanisha A. Owens Shark, [place]; 10/17/2017 2:33 PM) Arthritis Diabetes Mellitus Inguinal Hernia Other disease, cancer, significant illness Pancreatitis     Review of Systems (Tanisha A. Brown RMA; 10/17/2017 2:33 PM) General Not Present- Appetite Loss, Chills, Fatigue, Fever, Night Sweats, Weight Gain and Weight Loss. Skin Not Present- Change in Wart/Mole, Dryness, Hives, Jaundice, New Lesions, Non-Healing Wounds, Rash and Ulcer. HEENT Present- Seasonal Allergies and Wears glasses/contact lenses. Not Present- Earache, Hearing Loss, Hoarseness, Nose Bleed, Oral Ulcers, Ringing in the Ears, Sinus Pain, Sore Throat, Visual Disturbances and Yellow Eyes. Respiratory Present- Snoring. Not Present- Bloody sputum, Chronic Cough, Difficulty Breathing and Wheezing. Breast Not Present- Breast Mass, Breast Pain, Nipple Discharge and Skin Changes. Cardiovascular Not Present- Chest Pain, Difficulty Breathing Lying Down, Leg Cramps, Palpitations, Rapid Heart Rate, Shortness of Breath and Swelling of Extremities. Gastrointestinal Not Present- Abdominal Pain, Bloating, Bloody Stool, Change in Bowel Habits, Chronic diarrhea, Constipation, Difficulty  Swallowing, Excessive gas, Gets full quickly at meals, Hemorrhoids, Indigestion, Nausea, Rectal Pain and Vomiting. Male Genitourinary Not Present- Blood in Urine, Change in Urinary Stream, Frequency, Impotence, Nocturia, Painful Urination, Urgency and Urine Leakage. Musculoskeletal Present- Joint  Pain, Joint Stiffness and Swelling of Extremities. Not Present- Back Pain, Muscle Pain and Muscle Weakness. Neurological Not Present- Decreased Memory, Fainting, Headaches, Numbness, Seizures, Tingling, Tremor, Trouble walking and Weakness. Psychiatric Not Present- Anxiety, Bipolar, Change in Sleep Pattern, Depression, Fearful and Frequent crying. Endocrine Not Present- Cold Intolerance, Excessive Hunger, Hair Changes, Heat Intolerance, Hot flashes and New Diabetes. Hematology Not Present- Blood Thinners, Easy Bruising, Excessive bleeding, Gland problems, HIV and Persistent Infections.  Vitals (Tanisha A. Brown RMA; 10/17/2017 2:34 PM) 10/17/2017 2:33 PM Weight: 242.4 lb Height: 71in Body Surface Area: 2.29 m Body Mass Index: 33.81 kg/m  Temp.: 98.3F  Pulse: 84 (Regular)  BP: 126/82 (Sitting, Left Arm, Standard)   BP (!) 151/82   Pulse 75   Temp 99 F (37.2 C) (Oral)   Resp 18   Ht 6' (1.829 m)   Wt 110.5 kg   SpO2 98%   BMI 33.02 kg/m   General: Pt awake/alert/oriented x4 in no major acute distress Eyes: PERRL, normal EOM. Sclera nonicteric Neuro: CN II-XII intact w/o focal sensory/motor deficits. Lymph: No head/neck/groin lymphadenopathy Psych:  No delerium/psychosis/paranoia HENT: Normocephalic, Mucus membranes moist.  No thrush Neck: Supple, No tracheal deviation Chest: No pain.  Good respiratory excursion. CV:  Pulses intact.  Regular rhythm MS: Normal AROM mjr joints.  No obvious deformity Abdomen: Soft, Nondistended.  Nontender.  Periumbilical hernia.  No incarcerated hernias. Ext:  SCDs BLE.  No significant edema.  No cyanosis Skin: No petechiae /  purpura    Assessment & Plan Adin Hector MD; 10/17/2017 2:58 PM)  VENTRAL HERNIA WITHOUT OBSTRUCTION OR GANGRENE (K43.9) Impression: Evidence of supraumbilical 4 x 3 cm mass also reducible consistent with new ventral hernia. Not at site of prior infraumbilical incision. No evidence of any inguinal hernia recurrence.  While it is small, it is symptomatic. Persisting. I think he would benefit from surgical repair. He does have a history cortices and stenting but has been off his lung thinners in its been almost a year and a half since then. Doing well. Diabetes insulin requiring better under control. Most likely just need to check with cardiology to make sure it safe to proceed with surgery. He's had numerous orthopedic surgeries without incident since then.   PREOP - [place] - ENCOUNTER FOR PREOPERATIVE EXAMINATION FOR GENERAL SURGICAL PROCEDURE (Z01.818)  Current Plans You are being scheduled for surgery- Our schedulers will call you.  You should hear from our office's scheduling department within 5 working days about the location, date, and time of surgery. We try to make accommodations for patient's preferences in scheduling surgery, but sometimes the OR schedule or the surgeon's schedule prevents [place] from making those accommodations.  If you have not heard from our office [phone]) in 5 working days, call the office and ask for your surgeon's nurse.  If you have other questions about your diagnosis, plan, or surgery, call the office and ask for your surgeon's nurse.  Written instructions provided The anatomy & physiology of the abdominal wall was discussed. The pathophysiology of hernias was discussed. Natural history risks without surgery including progeressive enlargement, pain, incarceration, & strangulation was discussed. Contributors to complications such as smoking, obesity, diabetes, prior surgery, etc were discussed.  I feel the risks of no intervention will  lead to serious problems that outweigh the operative risks; therefore, I recommended surgery to reduce and repair the hernia. I explained laparoscopic techniques with possible need for an open approach. I noted the probable use of mesh to patch  and/or buttress the hernia repair  Risks such as bleeding, infection, abscess, need for further treatment, heart attack, death, and other risks were discussed. I noted a good likelihood this will help address the problem. Goals of post-operative recovery were discussed as well. Possibility that this will not correct all symptoms was explained. I stressed the importance of low-impact activity, aggressive pain control, avoiding constipation, & not pushing through pain to minimize risk of post-operative chronic pain or injury. Possibility of reherniation especially with smoking, obesity, diabetes, immunosuppression, and other health conditions was discussed. We will work to minimize complications.  An educational handout further explaining the pathology & treatment options was given as well. Questions were answered. The patient expresses understanding & wishes to proceed with surgery.  Pt Education - [facility] Hernia Post-Op HCI (Jerell Demery): discussed with patient and provided information. Pt Education - [facility] Pain Control (Scotty Pinder) Pt Education - Pamphlet Given - Laparoscopic Hernia Repair: discussed with patient and provided information. Pt Education - [facility] Mesh education: discussed with patient and provided information.  PRESENCE OF STENT IN CORONARY ARTERY IN PATIENT WITH CORONARY ARTERY DISEASE (I25.10) Impression: History of coronary artery disease requiring angiography and stenting January 2018. Finished 1 year Brilinta & now just on 81 mg aspirin. Good exercise tolerance.  Most likely will be safe to proceed with surgery. I'm okay with continuing aspirin. Cardiac clearance just, to make sure he is okay. Last seen in April 2019 and was doing  well  Current Plans I recommended obtaining preoperative cardiac clearance. I am concerned about the health of the patient and the ability to tolerate the operation. Therefore, we will request clearance by cardiology to better assess operative risk & see if a reevaluation, further workup, etc is needed. Also recommendations on how medications such as for anticoagulation and blood pressure should be managed/held/restarted after surgery.  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 Now in the 8s according to him.  Adin Hector, MD, FACS, MASCRS Gastrointestinal and Minimally Invasive Surgery    [address], [place], [place] [address] [phone] Main / Paging [phone]

## 2018-01-20 NOTE — Transfer of Care (Signed)
Immediate Anesthesia Transfer of Care Note  Patient: Stephen Arnold  Procedure(s) Performed: LAPAROSCOPIC VENTRAL WALL HERNIA (N/A Abdomen) INSERTION OF MESH (N/A Abdomen)  Patient Location: PACU  Anesthesia Type:General  Level of Consciousness: sedated  Airway & Oxygen Therapy: Patient Spontanous Breathing and Patient connected to face mask oxygen  Post-op Assessment: Report given to RN and Post -op Vital signs reviewed and stable  Post vital signs: Reviewed and stable  Last Vitals:  Vitals Value Taken Time  BP 153/64 01/20/2018  9:46 AM  Temp    Pulse 76 01/20/2018  9:48 AM  Resp 9 01/20/2018  9:48 AM  SpO2 100 % 01/20/2018  9:48 AM  Vitals shown include unvalidated device data.  Last Pain:  Vitals:   01/20/18 0621  TempSrc:   PainSc: 0-No pain      Patients Stated Pain Goal: 5 (53/20/23 3435)  Complications: No apparent anesthesia complications

## 2018-01-20 NOTE — Anesthesia Procedure Notes (Signed)
Date/Time: 01/20/2018 9:38 AM Performed by: Cynda Familia, CRNA Oxygen Delivery Method: Simple face mask Placement Confirmation: positive ETCO2 and breath sounds checked- equal and bilateral Dental Injury: Teeth and Oropharynx as per pre-operative assessment
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
## 2018-01-20 NOTE — Anesthesia Postprocedure Evaluation (Signed)
Anesthesia Post Note  Patient: Stephen Arnold  Procedure(s) Performed: LAPAROSCOPIC VENTRAL WALL HERNIA (N/A Abdomen) INSERTION OF MESH (N/A Abdomen)     Patient location during evaluation: PACU Anesthesia Type: General Level of consciousness: awake Pain management: pain level controlled Vital Signs Assessment: post-procedure vital signs reviewed and stable Respiratory status: spontaneous breathing Cardiovascular status: stable Postop Assessment: no apparent nausea or vomiting Anesthetic complications: no    Last Vitals:  Vitals:   01/20/18 1045 01/20/18 1100  BP: 139/79 127/70  Pulse: 66 81  Resp: 16 16  Temp:  36.8 C  SpO2: 98% 100%    Last Pain:  Vitals:   01/20/18 1100  TempSrc:   PainSc: 4    Pain Goal: Patients Stated Pain Goal: 5 (01/20/18 7276)               Rube Sanchez JR,JOHN Mateo Flow

## 2018-01-20 NOTE — Discharge Instructions (Signed)
HERNIA REPAIR: POST OP INSTRUCTIONS ° °###################################################################### ° °EAT °Gradually transition to a high fiber diet with a fiber supplement over the next few weeks after discharge.  Start with a pureed / full liquid diet (see below) ° °WALK °Walk an hour a day.  Control your pain to do that.   ° °CONTROL PAIN °Control pain so that you can walk, sleep, tolerate sneezing/coughing, and go up/down stairs. ° °HAVE A BOWEL MOVEMENT DAILY °Keep your bowels regular to avoid problems.  OK to try a laxative to override constipation.  OK to use an antidairrheal to slow down diarrhea.  Call if not better after 2 tries ° °CALL IF YOU HAVE PROBLEMS/CONCERNS °Call if you are still struggling despite following these instructions. °Call if you have concerns not answered by these instructions ° °###################################################################### ° ° ° °1. DIET: Follow a light bland diet the first 24 hours after arrival home, such as soup, liquids, crackers, etc.  Be sure to include lots of fluids daily.  Advance to a low fat / high fiber diet over the next few days after surgery.  Avoid fast food or heavy meals the first week as your are more likely to get nauseated.   ° °2. Take your usually prescribed home medications unless otherwise directed. ° °3. PAIN CONTROL: °a. Pain is best controlled by a usual combination of three different methods TOGETHER: °i. Ice/Heat °ii. Over the counter pain medication °iii. Prescription pain medication °b. Most patients will experience some swelling and bruising around the hernia(s) such as the bellybutton, groins, or old incisions.  Ice packs or heating pads (30-60 minutes up to 6 times a day) will help. Use ice for the first few days to help decrease swelling and bruising, then switch to heat to help relax tight/sore spots and speed recovery.  Some people prefer to use ice alone, heat alone, alternating between ice & heat.  Experiment  to what works for you.  Swelling and bruising can take several weeks to resolve.   °c. It is helpful to take an over-the-counter pain medication regularly for the first few weeks.  Choose one of the following that works best for you: °i. Naproxen (Aleve, etc)  Two 220mg tabs twice a day °ii. Ibuprofen (Advil, etc) Three 200mg tabs four times a day (every meal & bedtime) °iii. Acetaminophen (Tylenol, etc) 325-650mg four times a day (every meal & bedtime) °d. A  prescription for pain medication should be given to you upon discharge.  Take your pain medication as prescribed.  °i. If you are having problems/concerns with the prescription medicine (does not control pain, nausea, vomiting, rash, itching, etc), please call us [phone] to see if we need to switch you to a different pain medicine that will work better for you and/or control your side effect better. °ii. If you need a refill on your pain medication, please contact your pharmacy.  They will contact our office to request authorization. Prescriptions will not be filled after 5 pm or on week-ends. ° °4. Avoid getting constipated.  Between the surgery and the pain medications, it is common to experience some constipation.  Increasing fluid intake and taking a fiber supplement (such as Metamucil, Citrucel, FiberCon, MiraLax, etc) 1-2 times a day regularly will usually help prevent this problem from occurring.  A mild laxative (prune juice, Milk of Magnesia, MiraLax, etc) should be taken according to package directions if there are no bowel movements after 48 hours.   ° °5. Wash / shower every   day.  You may shower over the dressings as they are waterproof.    6. Remove your waterproof bandages, skin tapes, and other bandages 5 days after surgery. You may replace a dressing/Band-Aid to cover the incision for comfort if you wish. You may leave the incisions open to air.  You may replace a dressing/Band-Aid to cover an incision for comfort if you wish.   Continue to shower over incision(s) after the dressing is off.  7. ACTIVITIES as tolerated:   a. You may resume regular (light) daily activities beginning the next day--such as daily self-care, walking, climbing stairs--gradually increasing activities as tolerated.  Control your pain so that you can walk an hour a day.  If you can walk 30 minutes without difficulty, it is safe to try more intense activity such as jogging, treadmill, bicycling, low-impact aerobics, swimming, etc. b. Save the most intensive and strenuous activity for last such as sit-ups, heavy lifting, contact sports, etc  Refrain from any heavy lifting or straining until you are off narcotics for pain control.   c. DO NOT PUSH THROUGH PAIN.  Let pain be your guide: If it hurts to do something, don't do it.  Pain is your body warning you to avoid that activity for another week until the pain goes down. d. You may drive when you are no longer taking prescription pain medication, you can comfortably wear a seatbelt, and you can safely maneuver your car and apply brakes. e. Dennis Bast may have sexual intercourse when it is comfortable.   8. FOLLOW UP in our office a. Please call [facility] at [phone] to set up an appointment to see your surgeon in the office for a follow-up appointment approximately 2-3 weeks after your surgery. b. Make sure that you call for this appointment the day you arrive home to insure a convenient appointment time.  9.  If you have disability of FMLA / Family leave forms, please bring the forms to the office for processing.  (do not give to your surgeon).  WHEN TO CALL us [phone]: 1. Poor pain control 2. Reactions / problems with new medications (rash/itching, nausea, etc)  3. Fever over 101.5 F (38.5 C) 4. Inability to urinate 5. Nausea and/or vomiting 6. Worsening swelling or bruising 7. Continued bleeding from incision. 8. Increased pain, redness, or drainage from the incision   The clinic staff is  available to answer your questions during regular business hours (8:30am-5pm).  Please dont hesitate to call and ask to speak to one of our nurses for clinical concerns.   If you have a medical emergency, go to the nearest emergency room or call 911.  A surgeon from [facility] is always on call at the hospitals in [facility], [place], [place], [place], [place]  [address] ?  [address] [address], [place], [place]   [address] MAIN: [phone] ? TOLL FREE: [phone] ? FAX: [phone] [email or web address]     Post Anesthesia Home Care Instructions  Activity: Get plenty of rest for the remainder of the day. A responsible individual must stay with you for 24 hours following the procedure.  For the next 24 hours, DO NOT: -Drive a car -Paediatric nurse -Drink alcoholic beverages -Take any medication unless instructed by your physician -Make any legal decisions or sign important papers.  Meals: Start with liquid foods such as gelatin or soup. Progress to regular foods as tolerated. Avoid greasy, spicy, heavy foods. If nausea and/or vomiting  occur, drink only clear liquids until the nausea and/or vomiting subsides. Call your physician if vomiting continues.  Special Instructions/Symptoms: Your throat may feel dry or sore from the anesthesia or the breathing tube placed in your throat during surgery. If this causes discomfort, gargle with warm salt water. The discomfort should disappear within 24 hours.  If you had a scopolamine patch placed behind your ear for the management of post- operative nausea and/or vomiting:  1. The medication in the patch is effective for 72 hours, after which it should be removed.  Wrap patch in a tissue and discard in the trash. Wash hands thoroughly with soap and water. 2. You may remove the patch earlier than 72 hours if you experience unpleasant side effects which may include dry mouth,  dizziness or visual disturbances. 3. Avoid touching the patch. Wash your hands with soap and water after contact with the patch.     Information for Discharge Teaching: EXPAREL (bupivacaine liposome injectable suspension)   Your surgeon gave you EXPAREL(bupivacaine) in your surgical incision to help control your pain after surgery.   EXPAREL is a local anesthetic that provides pain relief by numbing the tissue around the surgical site.  EXPAREL is designed to release pain medication over time and can control pain for up to 72 hours.  Depending on how you respond to EXPAREL, you may require less pain medication during your recovery.  Possible side effects:  Temporary loss of sensation or ability to move in the area where bupivacaine was injected.  Nausea, vomiting, constipation  Rarely, numbness and tingling in your mouth or lips, lightheadedness, or anxiety may occur.  Call your doctor right away if you think you may be experiencing any of these sensations, or if you have other questions regarding possible side effects.  Follow all other discharge instructions given to you by your surgeon or nurse. Eat a healthy diet and drink plenty of water or other fluids.  If you return to the hospital for any reason within 96 hours following the administration of EXPAREL, please inform your health care providers.

## 2018-01-20 NOTE — Op Note (Signed)
01/20/2018  PATIENT:  Stephen Arnold  52 y.o. male  Patient Care Team: Thressa Sheller, MD as PCP - General (Internal Medicine) Fay Records, MD as PCP - Cardiology (Cardiology) Michael Boston, MD as Consulting Physician (General Surgery)  PRE-OPERATIVE DIAGNOSIS:  EPIGASTRIC ABDOMINAL WALL HERNIA  POST-OPERATIVE DIAGNOSIS:    Supraumbilical incarcerated incisional hernia. Umbilical incisional hernia.   PROCEDURE:   LAPAROSCOPIC VENTRAL & UMBILICAL HERNIA REPAIR INSERTION OF MESH  SURGEON:  Adin Hector, MD  ASSISTANT: Ave Filter, PA-S, [employer]    ANESTHESIA:     General Nerve block provided with liposomal bupivacaine (Experel) mixed with 0.25% bupivacaine as a Bilateral TAP block x30 mL at the level of the transverse abdominis & preperitoneal spaces along the flank at the anterior axillary line, from subcostal ridge to iliac crest under laparoscopic guidance   Local anesthesia field block: (0.25% bupivacaine & liposomal  Bupivacaine [Experel]) around port sites and primary hernia repair  EBL:  Total I/O In: 1400 [I.V.:1400] Out: 25 [Blood:25]  Per anesthesia record  Delay start of Pharmacological VTE agent (>24hrs) due to surgical blood loss or risk of bleeding:  no  DRAINS: none   SPECIMEN:  No Specimen  DISPOSITION OF SPECIMEN:  N/A  COUNTS:  YES  PLAN OF CARE: Discharge to home after PACU  PATIENT DISPOSITION:  PACU - hemodynamically stable.  INDICATION: Pleasant patient has developed a ventral wall abdominal hernia.  I done laparoscopic repair of recurrent left inguinal and right inguinal hernias 10 years ago.  He noticed swelling and a lump above his bellybutton suspicious for incarcerated hernia.  Recommendation was made for surgical repair:  The anatomy & physiology of the abdominal wall was discussed. The pathophysiology of hernias was discussed. Natural history risks without surgery including progeressive enlargement, pain,  incarceration & strangulation was discussed. Contributors to complications such as smoking, obesity, diabetes, prior surgery, etc were discussed.  I feel the risks of no intervention will lead to serious problems that outweigh the operative risks; therefore, I recommended surgery to reduce and repair the hernia. I explained laparoscopic techniques with possible need for an open approach. I noted the probable use of mesh to patch and/or buttress the hernia repair  Risks such as bleeding, infection, abscess, need for further treatment, heart attack, death, and other risks were discussed. I noted a good likelihood this will help address the problem. Goals of post-operative recovery were discussed as well. Possibility that this will not correct all symptoms was explained. I stressed the importance of low-impact activity, aggressive pain control, avoiding constipation, & not pushing through pain to minimize risk of post-operative chronic pain or injury. Possibility of reherniation especially with smoking, obesity, diabetes, immunosuppression, and other health conditions was discussed. We will work to minimize complications.  An educational handout further explaining the pathology & treatment options was given as well. Questions were answered. The patient expresses understanding & wishes to proceed with surgery.   OR FINDINGS: Supraumbilical and umbilical hernia is incarcerated with falciform ligament and preperitoneal fat.  5 x 3 cm region  Type of repair: Laparoscopic underlay repair   Placement of mesh: Intraperitoneal underlay repair  Name of mesh: Bard Ventralight dual sided (polypropylene / Seprafilm)  Size of mesh: 20x15cm  Orientation: Transverse  Mesh overlap:  5-7cm   DESCRIPTION:   Informed consent was confirmed. The patient underwent general anaesthesia without difficulty. The patient was positioned appropriately. VTE prevention in place. The patient's abdomen was clipped, prepped, &  draped in a  sterile fashion. Surgical timeout confirmed our plan.  The patient was positioned in reverse Trendelenburg. Abdominal entry was gained using optical entry technique in the left upper abdomen. Entry was clean. I induced carbon dioxide insufflation. Camera inspection revealed no injury. Extra ports were carefully placed under direct laparoscopic visualization.   I could see the hernia on the parietal peritoneum under the abdominal wall.  Periumbilical.  Intact ultra pro mesh in bilateral lower quadrants consistent with prior hernia repairs.  No evidence of recurrence down there.  I did laparoscopic scissors and focus blunt dissection to free the falciform ligament and preperitoneal fat off the anterior abdominal wall midline from xiphoid closer to pubis.  With this I can reduce out the falciform ligament and preperitoneal fat for an obvious supraumbilical hernia.  Small but definite umbilical hernia noted as well.  I made sure hemostasis was good.  I mapped out the region using a needle passer.   To ensure that I would have at least 5 cm radial coverage outside of the hernia defect, I chose a 20x15cm dual sided mesh.  I placed #1 Prolene stitches around its edge about every 5 cm = 12 total.  I rolled the mesh & placed into the peritoneal cavity through the larger hernia defect.  I unrolled the mesh and positioned it appropriately.  I secured the mesh to cover up the hernia defect using a laparoscopic suture passer to pass the tails of the Prolene through the abdominal wall & tagged them with clamps for good transfascial suturing.  I started out in four corners to make sure I had the mesh centered under the hernia defect appropriately, and then proceeded to work in quadrants.    We evacuated CO2 & desufflated the abdomen.  I tied the fascial stitches down. I closed the fascial defect that I placed the mesh through using #1 PDS interrupted transverse stitches primarily.  I reinsufflated the abdomen.  The mesh provided at least circumferential coverage around the entire region of hernia defects.  I secured the mesh centrally with an additional trans fascial stitch in & out the mesh using #1 PDS under laparoscopic visualization.   I tacked the edges & central part of the mesh to the peritoneum/posterior rectus fascia with SecureStrap absorbable tacks.   I did reinspection. Hemostasis was good. Mesh laid well. I completed a broad field block of local anesthesia at fascial stitch sites & fascial closure areas.    Capnoperitoneum was evacuated with the laparoscopic suction over the liver edge in an effort to try and get all carbon dioxide out.. Ports were removed. The skin was closed with Monocryl at the port sites and Steri-Strips on the fascial stitch puncture sites.  Patient is being extubated to go to the recovery room.  I discussed operative findings, updated the patient's status, discussed probable steps to recovery, and gave postoperative recommendations to the patient's spouse.  Recommendations were made.  Questions were answered.  She expressed understanding & appreciation.  Adin Hector, M.D., F.A.C.S. Gastrointestinal and Minimally Invasive Surgery [facility] [address] [place], [place] [address] [phone] Main / Paging  01/20/2018 9:43 AM

## 2018-01-23 ENCOUNTER — Encounter (HOSPITAL_BASED_OUTPATIENT_CLINIC_OR_DEPARTMENT_OTHER): Payer: Self-pay | Admitting: Surgery

## 2018-01-30 MED FILL — TOUJEO SOLOSTAR 300 UNITS/M: 300 | 42 days supply | Qty: 9 | Fill #0

## 2018-02-01 MED FILL — oxyCODONE HCL 5 MG TABS: 5 | 8 days supply | Qty: 30 | Fill #0

## 2018-02-07 MED FILL — GABAPENTIN 300 MG CAPSULE: 300 | 15 days supply | Qty: 60 | Fill #1

## 2018-02-17 MED FILL — BD PEN NDL NANO 32GX5/32: 32G X 4 MM | 28 days supply | Qty: 200 | Fill #5

## 2018-02-17 MED FILL — BD PEN NDL NANO 32GX5/32": 32G X 4 MM | 28 days supply | Qty: 200 | Fill #5

## 2018-02-28 MED FILL — HUMALOG 100 UNITS/ML KWIKPE: 100 | 50 days supply | Qty: 30 | Fill #0

## 2018-03-06 MED FILL — REPATHA 140 MG/1 ML INJ: 140 | 28 days supply | Qty: 2 | Fill #1

## 2018-03-09 MED FILL — TOUJEO SOLOSTAR 300 UNITS/M: 300 | 42 days supply | Qty: 9 | Fill #1

## 2018-03-13 DIAGNOSIS — M65322 Trigger finger, left index finger: Secondary | ICD-10-CM | POA: Diagnosis not present

## 2018-03-13 DIAGNOSIS — M65332 Trigger finger, left middle finger: Secondary | ICD-10-CM | POA: Diagnosis not present

## 2018-03-14 MED FILL — METOPROLOL SUCCINATE ER 25: 25 | 90 days supply | Qty: 90 | Fill #2

## 2018-03-27 MED FILL — REPATHA 140 MG/1 ML INJ: 140 | 28 days supply | Qty: 2 | Fill #2

## 2018-04-14 MED FILL — BD PEN NDL NANO 32GX5/32: 32G X 4 MM | 28 days supply | Qty: 200 | Fill #0

## 2018-04-14 MED FILL — BD PEN NDL NANO 32GX5/32": 32G X 4 MM | 28 days supply | Qty: 200 | Fill #0

## 2018-04-17 MED FILL — TOUJEO SOLOSTAR 300 UNITS/M: 300 | 42 days supply | Qty: 9 | Fill #2

## 2018-04-17 MED FILL — HUMALOG 100 UNITS/ML KWIKPE: 100 | 50 days supply | Qty: 30 | Fill #1

## 2018-04-17 MED FILL — REPATHA 140 MG/1 ML INJ: 140 | 28 days supply | Qty: 2 | Fill #3

## 2018-05-11 MED FILL — BD PEN NDL NANO 32GX5/32: 32G X 4 MM | 28 days supply | Qty: 200 | Fill #1

## 2018-05-11 MED FILL — REPATHA SURECLICK 140 MG/ML: 140 | 28 days supply | Qty: 2 | Fill #4

## 2018-05-11 MED FILL — BD PEN NDL NANO 32GX5/32": 32G X 4 MM | 28 days supply | Qty: 200 | Fill #1

## 2018-05-29 MED FILL — HUMALOG 100 UNITS/ML KWIKPE: 100 | 50 days supply | Qty: 30 | Fill #2

## 2018-05-31 MED FILL — TOUJEO SOLOSTAR 300 UNITS/M: 300 | 30 days supply | Qty: 9 | Fill #0

## 2018-06-02 DIAGNOSIS — J019 Acute sinusitis, unspecified: Secondary | ICD-10-CM | POA: Diagnosis not present

## 2018-06-02 MED FILL — GUAIATUSSIN AC LIQUID: 100-10 | 6 days supply | Qty: 120 | Fill #0

## 2018-06-02 MED FILL — DOXYCYCLINE HYC 100 MG CAPS: 100 | 10 days supply | Qty: 20 | Fill #0

## 2018-06-22 ENCOUNTER — Other Ambulatory Visit: Payer: Self-pay | Admitting: Physician Assistant

## 2018-06-22 MED FILL — METOPROLOL SUCCINATE ER 25: 25 | 90 days supply | Qty: 90 | Fill #0

## 2018-06-22 MED FILL — REPATHA SURECLICK 140 MG/ML: 140 | 28 days supply | Qty: 2 | Fill #5 | Status: TO

## 2018-07-05 MED FILL — TOUJEO SOLOSTAR 300 UNITS/M: 300 | 30 days supply | Qty: 9 | Fill #1 | Status: TO

## 2018-07-05 MED FILL — HUMALOG 100 UNITS/ML KWIKPE: 100 | 50 days supply | Qty: 30 | Fill #3 | Status: TO

## 2018-07-11 DIAGNOSIS — M940 Chondrocostal junction syndrome [Tietze]: Secondary | ICD-10-CM | POA: Diagnosis not present

## 2018-07-11 DIAGNOSIS — R0782 Intercostal pain: Secondary | ICD-10-CM | POA: Diagnosis not present

## 2018-07-11 DIAGNOSIS — R0781 Pleurodynia: Secondary | ICD-10-CM | POA: Diagnosis not present

## 2018-07-17 IMAGING — CT CT RENAL STONE PROTOCOL
2 of 4 series · 16 of 46 positions shown, 18 images · non-contrast
Comparison: None.

CLINICAL DATA: Left lower abdominal pain for 2 days. Diarrhea for 5
days.

EXAM:
CT ABDOMEN AND PELVIS WITHOUT CONTRAST
TECHNIQUE: Multidetector CT imaging of the abdomen and pelvis was performed
following the standard protocol without IV contrast.

[Series 2: axial st · axial · 0.88mm/px · z∈[-463,-3]mm · 13 of 102 slices shown, 15 images]
[im 5/102  soft-tissue]
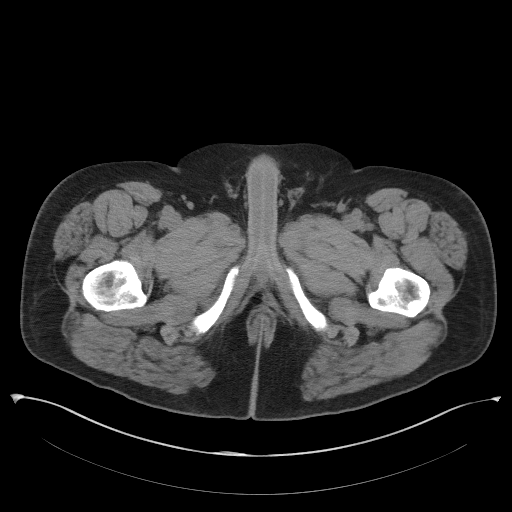
[im 5/102  bone]
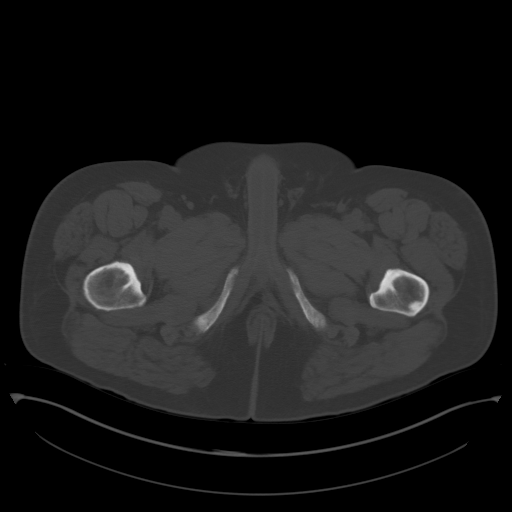
[im 14/102  soft-tissue]
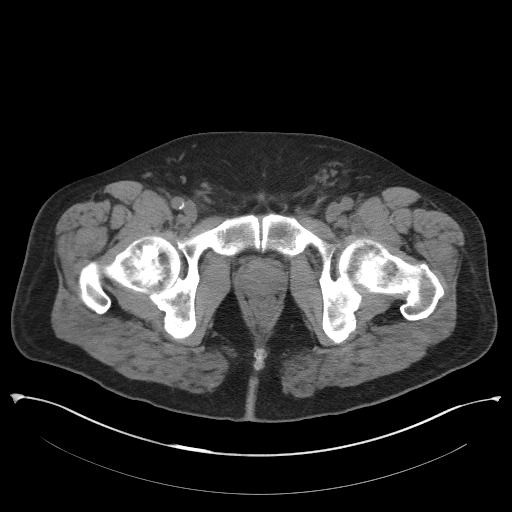
[im 22/102  soft-tissue]
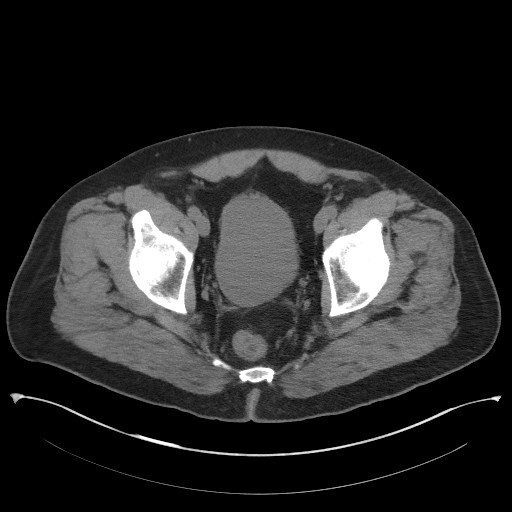
[im 27/102  soft-tissue]
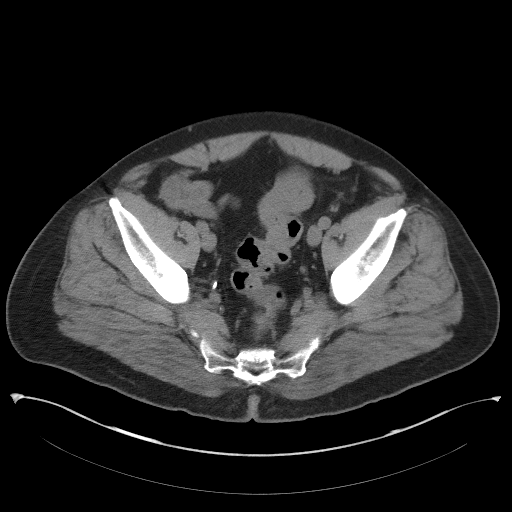
[im 36/102  soft-tissue]
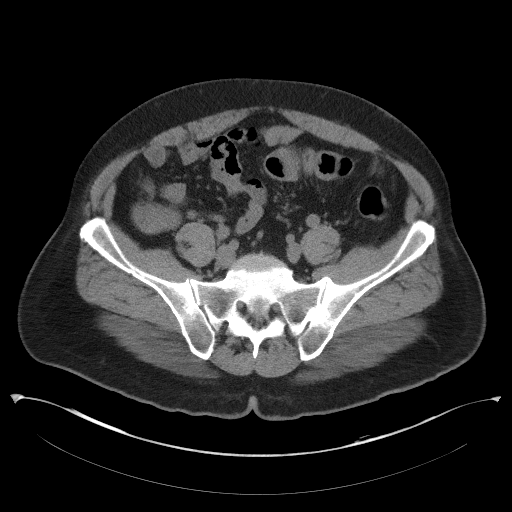
[im 44/102  soft-tissue]
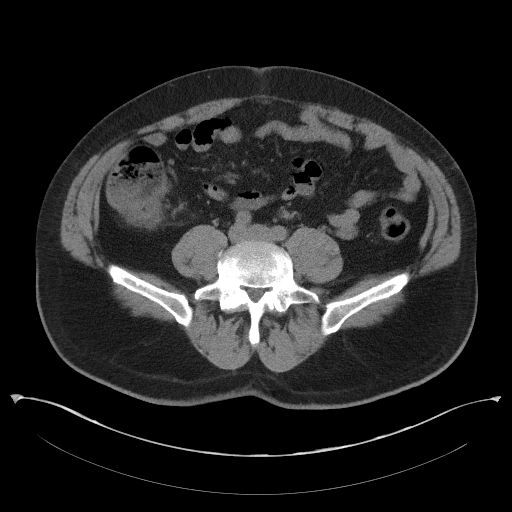
[im 53/102  soft-tissue]
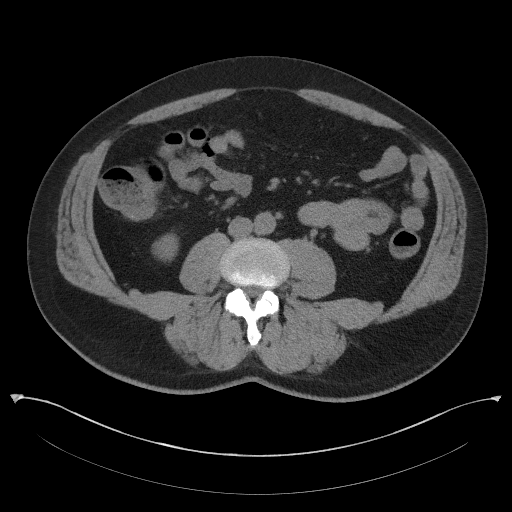
[im 58/102  soft-tissue]
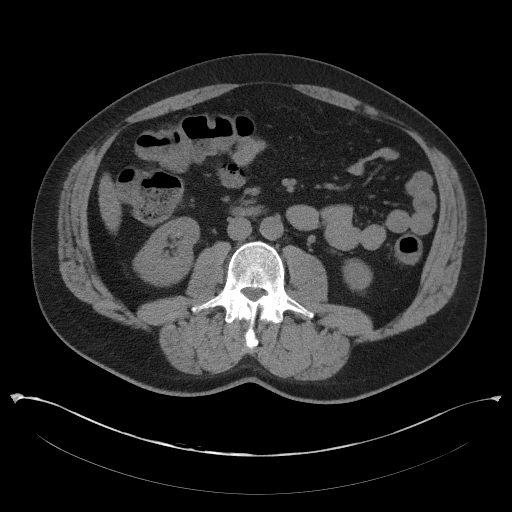
[im 66/102  soft-tissue]
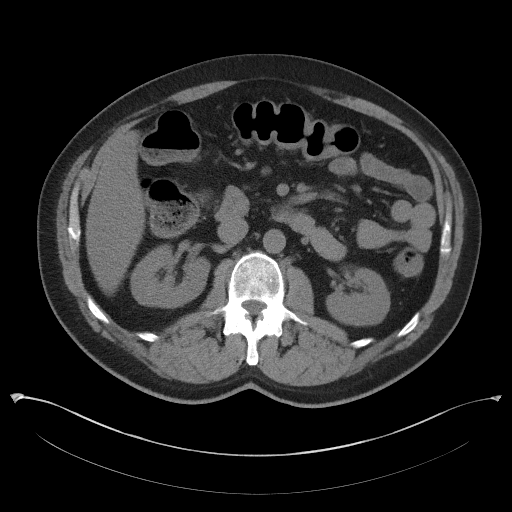
[im 66/102  bone]
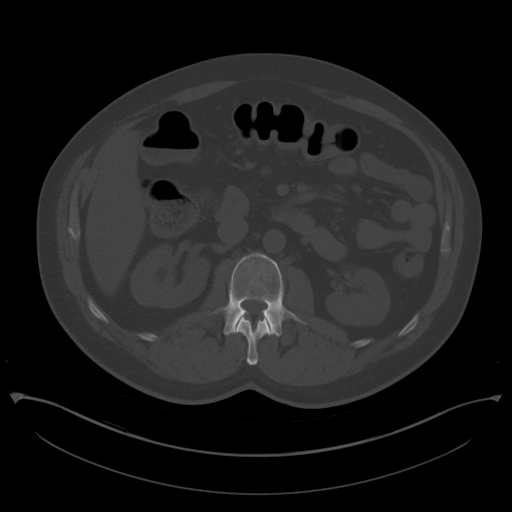
[im 75/102  soft-tissue]
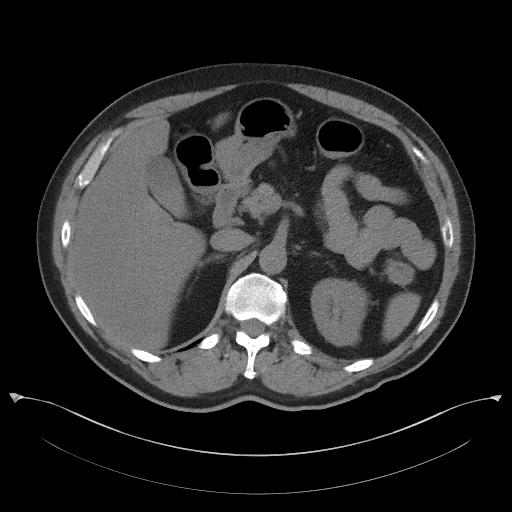
[im 80/102  soft-tissue]
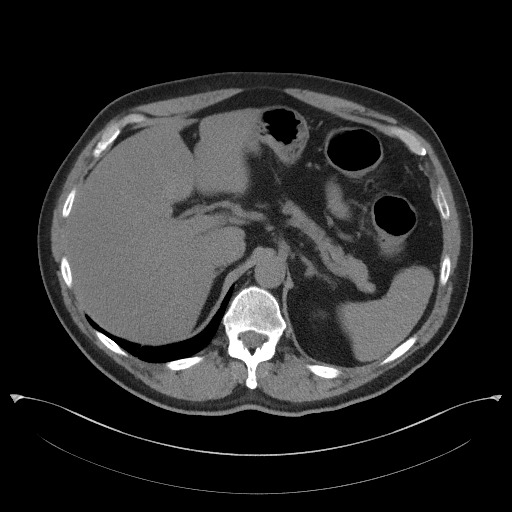
[im 88/102  soft-tissue]
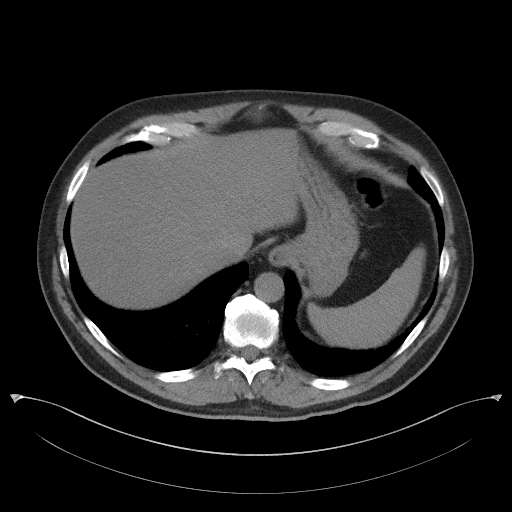
[im 97/102  soft-tissue]
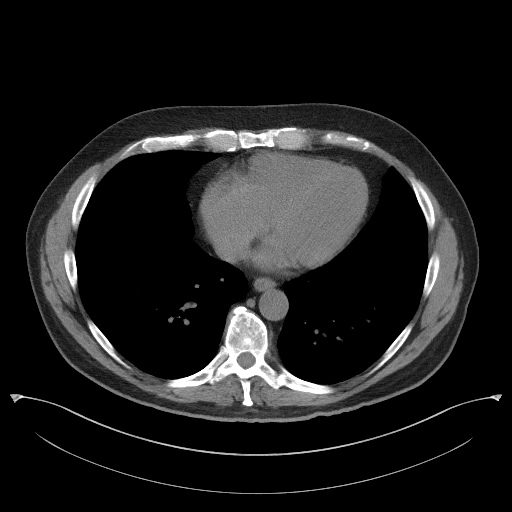

[Series 5: coronal st · coronal · 0.91mm/px · 3 of 103 slices shown]
[im 35/103  soft-tissue]
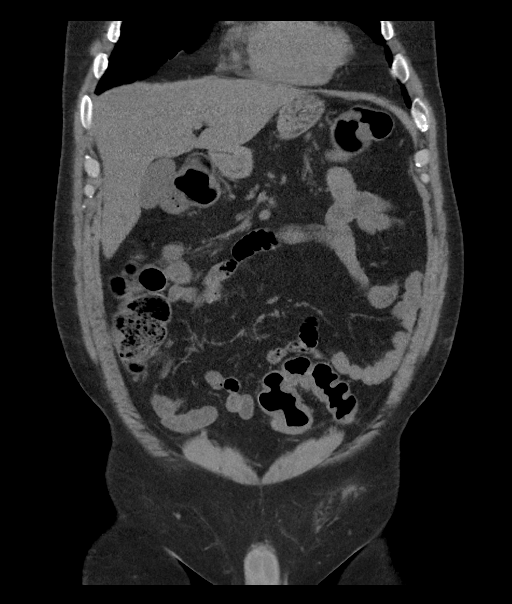
[im 46/103  soft-tissue]
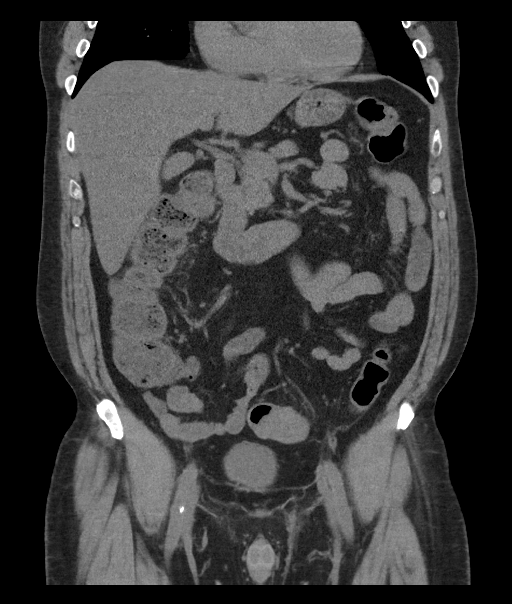
[im 57/103  soft-tissue]
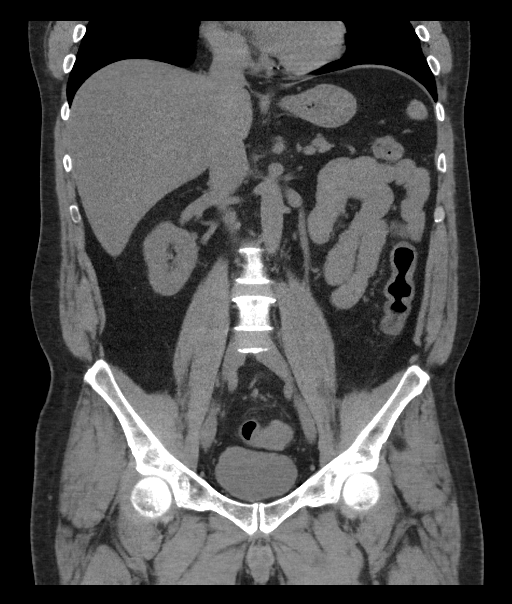

[16 of 46 positions shown; findings below may reference images not displayed]

FINDINGS: Lower chest: Clear lung bases.  Heart normal size.

Hepatobiliary: Diffuse decreased attenuation liver consistent with
fatty infiltration. No liver mass or focal lesion. Normal
gallbladder. No bile duct dilation.

Pancreas: Unremarkable. No pancreatic ductal dilatation or
surrounding inflammatory changes.

Spleen: Normal in size without focal abnormality.

Adrenals/Urinary Tract: Adrenal glands are unremarkable. Kidneys are
normal, without renal calculi, focal lesion, or hydronephrosis.
Bladder is unremarkable.

Stomach/Bowel: There is apparent wall thickening of the mid sigmoid
colon with no adjacent inflammation. No other evidence of colonic
wall thickening. No colon dilation to suggest obstruction.

Stomach and small bowel are unremarkable.

Normal appendix visualized.

Vascular/Lymphatic: There are scattered subcentimeter mesenteric
lymph nodes, more numerous and prominent than generally seen in this
age group. No pathologically enlarged lymph nodes.

Mild atherosclerotic calcifications are noted along the infrarenal
abdominal aorta.

Reproductive: Unremarkable.

Other: No abdominal wall hernia or abnormality. No abdominopelvic
ascites.

Musculoskeletal: Focal area sclerosis noted in the right femoral
neck likely a benign bone island. No osteolytic lesions. No fracture
or acute finding.
IMPRESSION: 1. Apparent wall thickening of the mid sigmoid colon. Findings may
be infectious/inflammatory. Neoplastic disease is not excluded.
2. No other evidence of bowel inflammation. No evidence of bowel
obstruction.
3. There are prominent mesenteric lymph nodes, none pathologically
enlarged by size criteria. This may be reactive related to bowel
infection or inflammation.
4. No other evidence of an acute abnormality.
5. Hepatic steatosis.
6. Aortic atherosclerosis.

## 2018-07-24 ENCOUNTER — Other Ambulatory Visit: Payer: Self-pay | Admitting: Physician Assistant

## 2018-07-24 MED FILL — UNIFINE PENTIPS 32GX5/32": 32G X 4 MM | 28 days supply | Qty: 200 | Fill #0

## 2018-07-24 MED FILL — UNIFINE PENTIPS 32GX5/32: 32G X 4 MM | 28 days supply | Qty: 200 | Fill #0

## 2018-07-24 MED FILL — REPATHA SURECLICK 140 MG/ML: 140 | 28 days supply | Qty: 2 | Fill #0

## 2018-07-29 MED FILL — TOUJEO SOLOSTAR 300 UNITS/M: 300 | 30 days supply | Qty: 9 | Fill #0

## 2018-08-02 MED FILL — BD PEN NDL NANO 32GX5/32: 32G X 4 MM | 28 days supply | Qty: 200 | Fill #1 | Status: TO

## 2018-08-02 MED FILL — BD PEN NDL NANO 32GX5/32": 32G X 4 MM | 28 days supply | Qty: 200 | Fill #1

## 2018-08-14 MED FILL — HUMALOG 100 UNITS/ML KWIKPE: 100 | 50 days supply | Qty: 30 | Fill #0

## 2018-09-08 MED FILL — REPATHA SURECLICK 140 MG/ML: 140 | 28 days supply | Qty: 2 | Fill #1

## 2018-09-08 MED FILL — TOUJEO SOLOSTAR 300 UNITS/M: 300 | 30 days supply | Qty: 9 | Fill #1 | Status: TO

## 2018-09-20 MED FILL — METOPROLOL SUCCINATE ER 25: 25 | 90 days supply | Qty: 90 | Fill #0

## 2018-09-25 ENCOUNTER — Other Ambulatory Visit: Payer: Self-pay | Admitting: Internal Medicine

## 2018-09-25 MED FILL — HUMALOG 100 UNITS/ML KWIKPE: 100 | 50 days supply | Qty: 30 | Fill #0

## 2018-09-25 MED FILL — BD PEN NDL NANO 32GX5/32": 32G X 4 MM | 28 days supply | Qty: 200 | Fill #0

## 2018-09-25 MED FILL — BD PEN NDL NANO 32GX5/32: 32G X 4 MM | 28 days supply | Qty: 200 | Fill #0

## 2018-10-13 DIAGNOSIS — Z125 Encounter for screening for malignant neoplasm of prostate: Secondary | ICD-10-CM | POA: Diagnosis not present

## 2018-10-13 DIAGNOSIS — Z0000 Encounter for general adult medical examination without abnormal findings: Secondary | ICD-10-CM | POA: Diagnosis not present

## 2018-10-13 DIAGNOSIS — E1165 Type 2 diabetes mellitus with hyperglycemia: Secondary | ICD-10-CM | POA: Diagnosis not present

## 2018-10-13 DIAGNOSIS — Z1159 Encounter for screening for other viral diseases: Secondary | ICD-10-CM | POA: Diagnosis not present

## 2018-10-13 MED FILL — TOUJEO SOLOSTAR 300 UNITS/M: 300 | 30 days supply | Qty: 9 | Fill #0

## 2018-10-19 DIAGNOSIS — M2550 Pain in unspecified joint: Secondary | ICD-10-CM | POA: Diagnosis not present

## 2018-10-19 DIAGNOSIS — N390 Urinary tract infection, site not specified: Secondary | ICD-10-CM | POA: Diagnosis not present

## 2018-10-19 DIAGNOSIS — E1165 Type 2 diabetes mellitus with hyperglycemia: Secondary | ICD-10-CM | POA: Diagnosis not present

## 2018-10-19 DIAGNOSIS — Z955 Presence of coronary angioplasty implant and graft: Secondary | ICD-10-CM | POA: Diagnosis not present

## 2018-10-19 DIAGNOSIS — J309 Allergic rhinitis, unspecified: Secondary | ICD-10-CM | POA: Diagnosis not present

## 2018-10-19 DIAGNOSIS — N529 Male erectile dysfunction, unspecified: Secondary | ICD-10-CM | POA: Diagnosis not present

## 2018-10-19 DIAGNOSIS — M79642 Pain in left hand: Secondary | ICD-10-CM | POA: Diagnosis not present

## 2018-10-19 DIAGNOSIS — Z0001 Encounter for general adult medical examination with abnormal findings: Secondary | ICD-10-CM | POA: Diagnosis not present

## 2018-10-19 DIAGNOSIS — Z7689 Persons encountering health services in other specified circumstances: Secondary | ICD-10-CM | POA: Diagnosis not present

## 2018-10-19 DIAGNOSIS — Z23 Encounter for immunization: Secondary | ICD-10-CM | POA: Diagnosis not present

## 2018-10-19 DIAGNOSIS — E785 Hyperlipidemia, unspecified: Secondary | ICD-10-CM | POA: Diagnosis not present

## 2018-10-19 DIAGNOSIS — I2510 Atherosclerotic heart disease of native coronary artery without angina pectoris: Secondary | ICD-10-CM | POA: Diagnosis not present

## 2018-10-19 MED FILL — VASCEPA 1 GM CAPSULE: 1 | 30 days supply | Qty: 120 | Fill #0

## 2018-11-02 ENCOUNTER — Other Ambulatory Visit: Payer: Self-pay

## 2018-11-03 MED FILL — HUMALOG 100 UNITS/ML KWIKPE: 100 | 40 days supply | Qty: 30 | Fill #1

## 2018-11-03 MED FILL — BD PEN NDL NANO 32GX5/32": 32G X 4 MM | 28 days supply | Qty: 200 | Fill #1

## 2018-11-03 MED FILL — BD PEN NDL NANO 32GX5/32: 32G X 4 MM | 28 days supply | Qty: 200 | Fill #1

## 2018-11-06 MED FILL — TOUJEO SOLOSTAR 300 UNITS/M: 300 | 30 days supply | Qty: 9 | Fill #1

## 2018-11-08 ENCOUNTER — Telehealth: Payer: Self-pay | Admitting: Internal Medicine

## 2018-11-08 MED FILL — REPATHA SURECLICK 140 MG/ML: 140 | 28 days supply | Qty: 2 | Fill #0

## 2018-11-08 NOTE — Telephone Encounter (Signed)
PA good though 11/02/2019. Called pharmacy- med ready for pick up. Patient aware

## 2018-11-08 NOTE — Telephone Encounter (Signed)
Patient's wife calling about Prior-auth for repatha.

## 2018-11-17 DIAGNOSIS — E1165 Type 2 diabetes mellitus with hyperglycemia: Secondary | ICD-10-CM | POA: Diagnosis not present

## 2018-11-17 DIAGNOSIS — E785 Hyperlipidemia, unspecified: Secondary | ICD-10-CM | POA: Diagnosis not present

## 2018-11-17 DIAGNOSIS — R635 Abnormal weight gain: Secondary | ICD-10-CM | POA: Diagnosis not present

## 2018-11-17 DIAGNOSIS — M2550 Pain in unspecified joint: Secondary | ICD-10-CM | POA: Diagnosis not present

## 2018-11-17 DIAGNOSIS — R609 Edema, unspecified: Secondary | ICD-10-CM | POA: Diagnosis not present

## 2018-11-17 DIAGNOSIS — Z955 Presence of coronary angioplasty implant and graft: Secondary | ICD-10-CM | POA: Diagnosis not present

## 2018-11-17 MED FILL — XIGDUO XR 10-500 MG TB24: 10-500 | 30 days supply | Qty: 30 | Fill #0

## 2018-12-12 MED FILL — REPATHA SURECLICK 140 MG/ML: 140 | 28 days supply | Qty: 2 | Fill #1

## 2018-12-15 MED FILL — HUMALOG 100 UNITS/ML KWIKPE: 100 | 40 days supply | Qty: 30 | Fill #2

## 2018-12-15 MED FILL — TOUJEO SOLOSTAR 300 UNITS/M: 300 | 41 days supply | Qty: 9 | Fill #2

## 2018-12-15 MED FILL — METOPROLOL SUCCINATE ER 25: 25 | 90 days supply | Qty: 90 | Fill #1

## 2018-12-19 MED FILL — XIGDUO XR 10-500 MG TB24: 10-500 | 30 days supply | Qty: 30 | Fill #1

## 2019-01-19 MED FILL — HUMALOG 100 UNITS/ML KWIKPE: 100 | 40 days supply | Qty: 30 | Fill #3

## 2019-01-19 MED FILL — BD PEN NDL NANO 32GX5/32: 32G X 4 MM | 28 days supply | Qty: 200 | Fill #2

## 2019-01-19 MED FILL — BD PEN NDL NANO 32GX5/32": 32G X 4 MM | 28 days supply | Qty: 200 | Fill #2

## 2019-01-19 MED FILL — TOUJEO SOLOSTAR 300 UNITS/M: 300 | 41 days supply | Qty: 9 | Fill #3

## 2019-01-19 MED FILL — REPATHA SURECLICK 140 MG/ML: 140 | 28 days supply | Qty: 2 | Fill #2

## 2019-01-19 MED FILL — XIGDUO XR 10-500 MG TB24: 10-500 | 30 days supply | Qty: 30 | Fill #2

## 2019-02-19 MED FILL — REPATHA SURECLICK 140 MG/ML: 140 | 28 days supply | Qty: 2 | Fill #3

## 2019-02-20 MED FILL — XIGDUO XR 10-500 MG TB24: 10-500 | 30 days supply | Qty: 30 | Fill #3

## 2019-02-21 DIAGNOSIS — Z23 Encounter for immunization: Secondary | ICD-10-CM | POA: Diagnosis not present

## 2019-02-21 DIAGNOSIS — E1165 Type 2 diabetes mellitus with hyperglycemia: Secondary | ICD-10-CM | POA: Diagnosis not present

## 2019-02-21 DIAGNOSIS — E785 Hyperlipidemia, unspecified: Secondary | ICD-10-CM | POA: Diagnosis not present

## 2019-02-26 MED FILL — HUMALOG 100 UNITS/ML KWIKPE: 100 | 50 days supply | Qty: 30 | Fill #0

## 2019-03-05 MED FILL — TOUJEO SOLOSTAR 300 UNITS/M: 300 | 41 days supply | Qty: 9 | Fill #4

## 2019-03-20 ENCOUNTER — Other Ambulatory Visit: Payer: Self-pay | Admitting: Physician Assistant

## 2019-03-20 MED FILL — XIGDUO XR 10-500 MG TB24: 10-500 | 30 days supply | Qty: 30 | Fill #4

## 2019-03-20 MED FILL — REPATHA SURECLICK 140 MG/ML: 140 | 28 days supply | Qty: 2 | Fill #4

## 2019-03-21 DIAGNOSIS — M25562 Pain in left knee: Secondary | ICD-10-CM | POA: Diagnosis not present

## 2019-03-21 MED FILL — METOPROLOL SUCCINATE ER 25: 25 | 15 days supply | Qty: 15 | Fill #0

## 2019-03-21 MED FILL — BD PEN NDL NANO 32GX5/32: 32G X 4 MM | 28 days supply | Qty: 200 | Fill #3

## 2019-03-21 MED FILL — BD PEN NDL NANO 32GX5/32": 32G X 4 MM | 28 days supply | Qty: 200 | Fill #3

## 2019-04-12 MED FILL — TOUJEO SOLOSTAR 300 UNITS/M: 300 | 41 days supply | Qty: 9 | Fill #5

## 2019-04-16 ENCOUNTER — Other Ambulatory Visit: Payer: Self-pay | Admitting: Physician Assistant

## 2019-04-16 MED FILL — REPATHA SURECLICK 140 MG/ML: 140 | 28 days supply | Qty: 2 | Fill #5

## 2019-04-16 MED FILL — XIGDUO XR 10-500 MG TB24: 10-500 | 30 days supply | Qty: 30 | Fill #5

## 2019-04-16 MED FILL — HUMALOG 100 UNITS/ML KWIKPE: 100 | 50 days supply | Qty: 30 | Fill #1

## 2019-04-17 MED FILL — METOPROLOL SUCCINATE ER 25: 25 | 15 days supply | Qty: 15 | Fill #0

## 2019-04-17 MED FILL — BD PEN NDL NANO 32GX5/32: 32G X 4 MM | 29 days supply | Qty: 200 | Fill #0

## 2019-04-17 MED FILL — BD PEN NDL NANO 32GX5/32": 32G X 4 MM | 29 days supply | Qty: 200 | Fill #0

## 2019-05-18 MED FILL — XIGDUO XR 10-500 MG TB24: 10-500 | 30 days supply | Qty: 30 | Fill #6

## 2019-05-21 MED FILL — REPATHA SURECLICK 140 MG/ML: 140 | 28 days supply | Qty: 2 | Fill #6

## 2019-05-21 MED FILL — TOUJEO SOLOSTAR 300 UNITS/M: 300 | 41 days supply | Qty: 9 | Fill #6

## 2019-05-23 MED FILL — BD PEN NDL NANO 32GX5/32: 32G X 4 MM | 29 days supply | Qty: 200 | Fill #1

## 2019-06-18 MED FILL — HUMALOG 100 UNITS/ML KWIKPE: 100 | 25 days supply | Qty: 15 | Fill #2

## 2019-06-18 MED FILL — REPATHA SURECLICK 140 MG/ML: 140 | 28 days supply | Qty: 2 | Fill #7

## 2019-06-18 MED FILL — XIGDUO XR 10-500 MG TB24: 10-500 | 30 days supply | Qty: 30 | Fill #0

## 2019-06-29 ENCOUNTER — Other Ambulatory Visit (HOSPITAL_COMMUNITY): Payer: Self-pay | Admitting: Internal Medicine

## 2019-06-29 MED FILL — TOUJEO SOLOSTAR 300 UNITS/M: 300 | 42 days supply | Qty: 9 | Fill #0

## 2019-07-11 MED FILL — HUMALOG 100 UNITS/ML KWIKPE: 100 | 25 days supply | Qty: 15 | Fill #3

## 2019-07-19 MED FILL — XIGDUO XR 10-500 MG TB24: 10-500 | 30 days supply | Qty: 30 | Fill #1

## 2019-07-23 ENCOUNTER — Ambulatory Visit: Payer: 59 | Admitting: Internal Medicine
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
## 2019-07-26 ENCOUNTER — Ambulatory Visit: Payer: 59 | Admitting: Physician Assistant

## 2019-08-01 DIAGNOSIS — M25562 Pain in left knee: Secondary | ICD-10-CM | POA: Diagnosis not present

## 2019-08-03 MED FILL — REPATHA SURECLICK 140 MG/ML: 140 | 28 days supply | Qty: 2 | Fill #8

## 2019-08-03 MED FILL — BASAGLAR 100 UNIT/ML KWIKPE: 100 | 83 days supply | Qty: 54 | Fill #0

## 2019-08-06 MED FILL — HUMALOG 100 UNITS/ML KWIKPE: 100 | 25 days supply | Qty: 15 | Fill #4

## 2019-08-20 MED FILL — XIGDUO XR 10-500 MG TB24: 10-500 | 30 days supply | Qty: 30 | Fill #2

## 2019-09-18 MED FILL — XIGDUO XR 10-500 MG TB24: 10-500 | 30 days supply | Qty: 30 | Fill #3

## 2019-10-19 MED FILL — HUMALOG 100 UNITS/ML KWIKPE: 100 | 25 days supply | Qty: 15 | Fill #7

## 2019-10-19 MED FILL — XIGDUO XR 10-500 MG TB24: 10-500 | 30 days supply | Qty: 30 | Fill #4

## 2019-10-23 ENCOUNTER — Other Ambulatory Visit: Payer: Self-pay | Admitting: Internal Medicine

## 2019-10-23 DIAGNOSIS — Z125 Encounter for screening for malignant neoplasm of prostate: Secondary | ICD-10-CM | POA: Diagnosis not present

## 2019-10-23 DIAGNOSIS — Z0000 Encounter for general adult medical examination without abnormal findings: Secondary | ICD-10-CM | POA: Diagnosis not present

## 2019-10-23 DIAGNOSIS — Z130 Encounter for screening for diseases of the blood and blood-forming organs and certain disorders involving the immune mechanism: Secondary | ICD-10-CM | POA: Diagnosis not present

## 2019-10-23 DIAGNOSIS — E1165 Type 2 diabetes mellitus with hyperglycemia: Secondary | ICD-10-CM | POA: Diagnosis not present

## 2019-10-26 ENCOUNTER — Other Ambulatory Visit (HOSPITAL_COMMUNITY): Payer: Self-pay | Admitting: Endocrinology

## 2019-10-26 DIAGNOSIS — N529 Male erectile dysfunction, unspecified: Secondary | ICD-10-CM | POA: Diagnosis not present

## 2019-10-26 DIAGNOSIS — R609 Edema, unspecified: Secondary | ICD-10-CM | POA: Diagnosis not present

## 2019-10-26 DIAGNOSIS — I2510 Atherosclerotic heart disease of native coronary artery without angina pectoris: Secondary | ICD-10-CM | POA: Diagnosis not present

## 2019-10-26 DIAGNOSIS — E785 Hyperlipidemia, unspecified: Secondary | ICD-10-CM | POA: Diagnosis not present

## 2019-10-26 DIAGNOSIS — E1165 Type 2 diabetes mellitus with hyperglycemia: Secondary | ICD-10-CM | POA: Diagnosis not present

## 2019-10-26 MED FILL — FUROSEMIDE 40 MG TAB: 40 | 30 days supply | Qty: 30 | Fill #0

## 2019-10-29 DIAGNOSIS — M25562 Pain in left knee: Secondary | ICD-10-CM | POA: Diagnosis not present

## 2019-10-30 ENCOUNTER — Other Ambulatory Visit: Payer: Self-pay | Admitting: Internal Medicine

## 2019-10-30 MED FILL — REPATHA SURECLICK 140 MG/ML: 140 | 28 days supply | Qty: 2 | Fill #0

## 2019-11-09 MED FILL — TOUJEO SOLOSTAR 300 UNITS/M: 300 | 83 days supply | Qty: 18 | Fill #2

## 2019-11-09 MED FILL — HUMALOG 100 UNITS/ML KWIKPE: 100 | 25 days supply | Qty: 15 | Fill #8

## 2019-11-21 MED FILL — BD PEN NDL NANO 32GX5/32: 32G X 4 MM | 29 days supply | Qty: 200 | Fill #3

## 2019-11-21 MED FILL — XIGDUO XR 10-500 MG TB24: 10-500 | 30 days supply | Qty: 30 | Fill #5

## 2019-12-05 DIAGNOSIS — M25562 Pain in left knee: Secondary | ICD-10-CM | POA: Diagnosis not present

## 2019-12-05 MED FILL — HYDROCODON-APAP 5-325: 5-325 | 30 days supply | Qty: 30 | Fill #0

## 2019-12-13 MED FILL — REPATHA SURECLICK 140 MG/ML: 140 | 28 days supply | Qty: 2 | Fill #1

## 2019-12-13 MED FILL — HUMALOG 100 UNITS/ML KWIKPE: 100 | 25 days supply | Qty: 15 | Fill #9
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
## 2019-12-17 ENCOUNTER — Encounter: Payer: Self-pay | Admitting: Internal Medicine

## 2019-12-17 ENCOUNTER — Other Ambulatory Visit: Payer: Self-pay

## 2019-12-17 ENCOUNTER — Ambulatory Visit (INDEPENDENT_AMBULATORY_CARE_PROVIDER_SITE_OTHER): Payer: 59 | Admitting: Internal Medicine

## 2019-12-17 VITALS — BP 172/96 | HR 69 | Ht 72.0 in | Wt 252.4 lb

## 2019-12-17 DIAGNOSIS — I2510 Atherosclerotic heart disease of native coronary artery without angina pectoris: Secondary | ICD-10-CM

## 2019-12-17 LAB — LIPID PANEL
Chol/HDL Ratio: 3.2 ratio (ref 0.0–5.0)
Cholesterol, Total: 147 mg/dL (ref 100–199)
HDL: 46 mg/dL (ref >39)
LDL Chol Calc (NIH): 75 mg/dL (ref 0–99)
Triglycerides: 147 mg/dL (ref 0–149)
VLDL Cholesterol Cal: 26 mg/dL (ref 5–40)

## 2019-12-17 NOTE — Patient Instructions (Signed)
Medication Instructions:  No changes *If you need a refill on your cardiac medications before your next appointment, please call your pharmacy*   Lab Work: Today: LIPIDS  If you have labs (blood work) drawn today and your tests are completely normal, you will receive your results only by: Marland Kitchen MyChart Message (if you have MyChart) OR . A paper copy in the mail If you have any lab test that is abnormal or we need to change your treatment, we will call you to review the results.   Testing/Procedures: None  Follow-Up: At [facility], you and your health needs are our priority.  As part of our continuing mission to provide you with exceptional heart care, we have created designated Provider Care Teams.  These Care Teams include your primary Cardiologist (physician) and Advanced Practice Providers (APPs -  Physician Assistants and Nurse Practitioners) who all work together to provide you with the care you need, when you need it.   Your next appointment:   6 month(s)  The format for your next appointment:   In Person  Provider:   You may see Dorris Carnes, MD or one of the following Advanced Practice Providers on your designated Care Team:    Richardson Dopp, PA-C  Robbie Lis, [place]   Other Instructions

## 2019-12-19 ENCOUNTER — Other Ambulatory Visit: Payer: Self-pay | Admitting: *Deleted

## 2019-12-19 DIAGNOSIS — I2510 Atherosclerotic heart disease of native coronary artery without angina pectoris: Secondary | ICD-10-CM

## 2019-12-20 DIAGNOSIS — M1712 Unilateral primary osteoarthritis, left knee: Secondary | ICD-10-CM | POA: Diagnosis not present

## 2019-12-20 DIAGNOSIS — M25562 Pain in left knee: Secondary | ICD-10-CM | POA: Diagnosis not present

## 2019-12-21 ENCOUNTER — Other Ambulatory Visit (HOSPITAL_COMMUNITY): Payer: Self-pay | Admitting: Endocrinology

## 2019-12-21 ENCOUNTER — Other Ambulatory Visit: Payer: Self-pay | Admitting: Physician Assistant

## 2019-12-21 MED FILL — XIGDUO XR 10-500 MG TB24: 10-500 | 30 days supply | Qty: 30 | Fill #0

## 2020-01-02 DIAGNOSIS — Z1212 Encounter for screening for malignant neoplasm of rectum: Secondary | ICD-10-CM | POA: Diagnosis not present

## 2020-01-02 DIAGNOSIS — Z1211 Encounter for screening for malignant neoplasm of colon: Secondary | ICD-10-CM | POA: Diagnosis not present

## 2020-01-07 MED FILL — HUMALOG 100 UNITS/ML KWIKPE: 100 | 25 days supply | Qty: 15 | Fill #10

## 2020-01-14 MED FILL — TOUJEO SOLOSTAR 300 UNITS/M: 300 | 83 days supply | Qty: 18 | Fill #3

## 2020-01-15 MED FILL — REPATHA SURECLICK 140 MG/ML: 140 | 28 days supply | Qty: 2 | Fill #2

## 2020-01-15 MED FILL — XIGDUO XR 10-500 MG TB24: 10-500 | 30 days supply | Qty: 30 | Fill #1

## 2020-01-17 MED FILL — BD PEN NDL NANO 32GX5/32: 32G X 4 MM | 29 days supply | Qty: 200 | Fill #4

## 2020-01-24 ENCOUNTER — Encounter: Payer: Self-pay | Admitting: Gastroenterology

## 2020-01-25 ENCOUNTER — Ambulatory Visit (AMBULATORY_SURGERY_CENTER): Payer: 59 | Admitting: *Deleted

## 2020-01-25 ENCOUNTER — Other Ambulatory Visit: Payer: Self-pay

## 2020-01-25 ENCOUNTER — Other Ambulatory Visit: Payer: Self-pay | Admitting: Gastroenterology

## 2020-01-25 VITALS — Ht 72.0 in | Wt 250.0 lb

## 2020-01-25 DIAGNOSIS — R195 Other fecal abnormalities: Secondary | ICD-10-CM

## 2020-01-25 MED ORDER — SUPREP BOWEL PREP KIT 17.5-3.13-1.6 GM/177ML PO SOLN: 1.0000 | Freq: Once | ORAL | 0 refills | Status: DC

## 2020-01-25 MED FILL — SUPREP BOWEL PREP KIT: 17.5-3.13-1 | 2 days supply | Qty: 354 | Fill #0

## 2020-01-25 NOTE — Progress Notes (Signed)
Completed covid vaccines greater than 2 weeks, booster received 01-19-20  Pt is aware that care partner will wait in the car during procedure; if they feel like they will be too hot or cold to wait in the car; they may wait in the 4 th floor lobby. Patient is aware to bring only one care partner. We want them to wear a mask (we do not have any that we can provide them), practice social distancing, and we will check their temperatures when they get here.  I did remind the patient that their care partner needs to stay in the parking lot the entire time and have a cell phone available, we will call them when the pt is ready for discharge. Patient will wear mask into building.  Pt's previsit is done over the phone and all paperwork (prep instructions, blank consent form to just read over, pre-procedure acknowledgement form and stamped envelope) sent to patient   No trouble with anesthesia, difficulty with intubation or hx/fam hx of malignant hyperthermia per pt   No egg or soy allergy  No home oxygen use   No medications for weight loss taken  emmi information given  Pt denies constipation issues

## 2020-01-31 MED FILL — HUMALOG 100 UNITS/ML KWIKPE: 100 | 25 days supply | Qty: 15 | Fill #11

## 2020-02-05 ENCOUNTER — Telehealth: Payer: Self-pay | Admitting: Internal Medicine

## 2020-02-05 NOTE — Telephone Encounter (Signed)
       [facility] Pre-operative Risk Assessment    HEARTCARE STAFF: - Please ensure there is not already an duplicate clearance open for this procedure. - Under Visit Info/Reason for Call, type in Other and utilize the format Clearance MM/DD/YY or Clearance TBD. Do not use dashes or single digits. - If request is for dental extraction, please clarify the # of teeth to be extracted.  Request for surgical clearance:  1. What type of surgery is being performed? Left knee arthroplasty   2. When is this surgery scheduled? 02/28/20  3. What type of clearance is required (medical clearance vs. Pharmacy clearance to hold med vs. Both)? Both  4. Are there any medications that need to be held prior to surgery and how long? Aspirin  5. Practice name and name of physician performing surgery? Dr. Melrose Nakayama  6. What is the office phone number? [phone]   7.   What is the office fax number? [phone]  8.   Anesthesia type (None, local, MAC, general) ? Spinal   Stephen Arnold 02/05/2020, 4:00 PM  _________________________________________________________________   (provider comments below)

## 2020-02-05 NOTE — Telephone Encounter (Signed)
   Primary Cardiologist: Dorris Carnes, MD  Chart reviewed as part of pre-operative protocol coverage. Patient was contacted 02/05/2020 in reference to pre-operative risk assessment for pending surgery as outline/d below.  ISRRAEL FLUCKIGER was last seen on 12/17/19 by Dr. Harrington Challenger.  Since that day, IZZAK FRIES has done well. He can complete more than 4.0 METS without angina. Given his heart disease, we prefer to continue ASA throughout the perioperative period. If doing so would significantly increase morbidity or mortality, can hold for 5-7 days.  Therefore, based on ACC/AHA guidelines, the patient would be at acceptable risk for the planned procedure without further cardiovascular testing.   The patient was advised that if he develops new symptoms prior to surgery to contact our office to arrange for a follow-up visit, and he verbalized understanding.  I will route this recommendation to the requesting party via Epic fax function and remove from pre-op pool. Please call with questions.  Ledora Bottcher, PA 02/05/2020, 4:48 PM /

## 2020-02-11 MED FILL — REPATHA SURECLICK 140 MG/ML: 140 | 28 days supply | Qty: 2 | Fill #3

## 2020-02-13 ENCOUNTER — Other Ambulatory Visit: Payer: Self-pay

## 2020-02-13 ENCOUNTER — Ambulatory Visit (AMBULATORY_SURGERY_CENTER): Payer: 59 | Admitting: Gastroenterology

## 2020-02-13 ENCOUNTER — Encounter: Payer: Self-pay | Admitting: Gastroenterology

## 2020-02-13 VITALS — BP 171/105 | HR 74 | Temp 99.1°F | Resp 12 | Ht 72.0 in | Wt 250.0 lb

## 2020-02-13 DIAGNOSIS — Z1211 Encounter for screening for malignant neoplasm of colon: Secondary | ICD-10-CM | POA: Diagnosis not present

## 2020-02-13 DIAGNOSIS — D123 Benign neoplasm of transverse colon: Secondary | ICD-10-CM

## 2020-02-13 DIAGNOSIS — D124 Benign neoplasm of descending colon: Secondary | ICD-10-CM

## 2020-02-13 DIAGNOSIS — K635 Polyp of colon: Secondary | ICD-10-CM

## 2020-02-13 DIAGNOSIS — I10 Essential (primary) hypertension: Secondary | ICD-10-CM | POA: Diagnosis not present

## 2020-02-13 DIAGNOSIS — D125 Benign neoplasm of sigmoid colon: Secondary | ICD-10-CM

## 2020-02-13 DIAGNOSIS — K640 First degree hemorrhoids: Secondary | ICD-10-CM

## 2020-02-13 DIAGNOSIS — I2510 Atherosclerotic heart disease of native coronary artery without angina pectoris: Secondary | ICD-10-CM | POA: Diagnosis not present

## 2020-02-13 DIAGNOSIS — E119 Type 2 diabetes mellitus without complications: Secondary | ICD-10-CM | POA: Diagnosis not present

## 2020-02-13 DIAGNOSIS — R195 Other fecal abnormalities: Secondary | ICD-10-CM | POA: Diagnosis not present

## 2020-02-13 MED ORDER — SODIUM CHLORIDE 0.9 % IV SOLN: 500.0000 mL | INTRAVENOUS | Status: DC

## 2020-02-13 NOTE — Op Note (Signed)
[name] Procedure Date: 02/13/2020 10:01 AM MRN: [id number] Endoscopist: Ladene Artist , MD Age: 54 Referring MD:  Date of Birth: 1966-03-22 Gender: Male Account #: [id number] Procedure:                Colonoscopy Indications:              Positive Cologuard test Medicines:                Monitored Anesthesia Care Procedure:                Pre-Anesthesia Assessment:                           - Prior to the procedure, a History and Physical                            was performed, and patient medications and                            allergies were reviewed. The patient's tolerance of                            previous anesthesia was also reviewed. The risks                            and benefits of the procedure and the sedation                            options and risks were discussed with the patient.                            All questions were answered, and informed consent                            was obtained. Prior Anticoagulants: The patient has                            taken no previous anticoagulant or antiplatelet                            agents. ASA Grade Assessment: III - A patient with                            severe systemic disease. After reviewing the risks                            and benefits, the patient was deemed in                            satisfactory condition to undergo the procedure.                           After obtaining informed consent, the colonoscope  was passed under direct vision. Throughout the                            procedure, the patient's blood pressure, pulse, and                            oxygen saturations were monitored continuously. The                            Colonoscope was introduced through the anus and                            advanced to the the cecum, identified by                            appendiceal orifice and ileocecal  valve. The                            ileocecal valve, appendiceal orifice, and rectum                            were photographed. The quality of the bowel                            preparation was good. The colonoscopy was performed                            without difficulty. The patient tolerated the                            procedure well. Scope In: 10:11:09 AM Scope Out: 10:39:12 AM Scope Withdrawal Time: 0 hours 25 minutes 27 seconds  Total Procedure Duration: 0 hours 28 minutes 3 seconds  Findings:                 The perianal and digital rectal examinations were                            normal.                           Ten sessile polyps were found in the sigmoid colon                            (6), descending colon (3) and transverse colon (1).                            The polyps were 6 to 8 mm in size. These polyps                            were removed with a cold snare. Resection and                            retrieval were complete.  Five sessile polyps were found in the sigmoid                            colon. The polyps were 10 to 18 mm in size. These                            polyps were removed with a hot snare. Resection and                            retrieval were complete.                           Internal hemorrhoids were found during                            retroflexion. The hemorrhoids were moderate and                            Grade I (internal hemorrhoids that do not prolapse).                           The exam was otherwise without abnormality on                            direct and retroflexion views. Complications:            No immediate complications. Estimated blood loss:                            None. Estimated Blood Loss:     Estimated blood loss: none. Impression:               - Ten 6 to 8 mm polyps in the sigmoid colon, in the                            descending colon and in the transverse colon,                             removed with a cold snare. Resected and retrieved.                           - Five 10 to 18 mm polyps in the sigmoid colon,                            removed with a hot snare. Resected and retrieved.                           - Internal hemorrhoids.                           - The examination was otherwise normal on direct                            and retroflexion views. Recommendation:           -  Repeat colonoscopy after studies are complete for                            surveillance based on pathology results.                           - Patient has a contact number available for                            emergencies. The signs and symptoms of potential                            delayed complications were discussed with the                            patient. Return to normal activities tomorrow.                            Written discharge instructions were provided to the                            patient.                           - Resume previous diet.                           - Continue present medications.                           - Await pathology results.                           - No aspirin, ibuprofen, naproxen, or other                            non-steroidal anti-inflammatory drugs for 2 weeks                            after polyp removal. Ladene Artist, MD 02/13/2020 10:45:05 AM This report has been signed electronically.

## 2020-02-13 NOTE — Progress Notes (Signed)
Called to room to assist during endoscopic procedure.  Patient ID and intended procedure confirmed with present staff. Received instructions for my participation in the procedure from the performing physician.  

## 2020-02-13 NOTE — Progress Notes (Signed)
Report given to PACU, vss 

## 2020-02-13 NOTE — Patient Instructions (Signed)
Please read handouts provided. Continue present medications. Await pathology results. No aspirin, ibuprofen, naproxen, or other non-steriodal anti-inflammatory drugs for 2 weeks.    YOU HAD AN ENDOSCOPIC PROCEDURE TODAY AT THE [facility]:   Refer to the procedure report that was given to you for any specific questions about what was found during the examination.  If the procedure report does not answer your questions, please call your gastroenterologist to clarify.  If you requested that your care partner not be given the details of your procedure findings, then the procedure report has been included in a sealed envelope for you to review at your convenience later.  YOU SHOULD EXPECT: Some feelings of bloating in the abdomen. Passage of more gas than usual.  Walking can help get rid of the air that was put into your GI tract during the procedure and reduce the bloating. If you had a lower endoscopy (such as a colonoscopy or flexible sigmoidoscopy) you may notice spotting of blood in your stool or on the toilet paper. If you underwent a bowel prep for your procedure, you may not have a normal bowel movement for a few days.  Please Note:  You might notice some irritation and congestion in your nose or some drainage.  This is from the oxygen used during your procedure.  There is no need for concern and it should clear up in a day or so.  SYMPTOMS TO REPORT IMMEDIATELY:  Following lower endoscopy (colonoscopy or flexible sigmoidoscopy):  Excessive amounts of blood in the stool  Significant tenderness or worsening of abdominal pains  Swelling of the abdomen that is new, acute  Fever of 100F or higher   For urgent or emergent issues, a gastroenterologist can be reached at any hour by calling [phone]. Do not use MyChart messaging for urgent concerns.    DIET:  We do recommend a small meal at first, but then you may proceed to your regular diet.  Drink plenty of fluids but you  should avoid alcoholic beverages for 24 hours.  ACTIVITY:  You should plan to take it easy for the rest of today and you should NOT DRIVE or use heavy machinery until tomorrow (because of the sedation medicines used during the test).    FOLLOW UP: Our staff will call the number listed on your records 48-72 hours following your procedure to check on you and address any questions or concerns that you may have regarding the information given to you following your procedure. If we do not reach you, we will leave a message.  We will attempt to reach you two times.  During this call, we will ask if you have developed any symptoms of COVID 19. If you develop any symptoms (ie: fever, flu-like symptoms, shortness of breath, cough etc.) before then, please call [phone].  If you test positive for Covid 19 in the 2 weeks post procedure, please call and report this information to us.    If any biopsies were taken you will be contacted by phone or by letter within the next 1-3 weeks.  Please call us at [phone] if you have not heard about the biopsies in 3 weeks.    SIGNATURES/CONFIDENTIALITY: You and/or your care partner have signed paperwork which will be entered into your electronic medical record.  These signatures attest to the fact that that the information above on your After Visit Summary has been reviewed and is understood.  Full responsibility of the confidentiality of this discharge information   this discharge information lies with you and/or your care-partner.

## 2020-02-13 NOTE — Progress Notes (Signed)
BP 190/120, Labetalol given IV, MD update, vss

## 2020-02-14 ENCOUNTER — Other Ambulatory Visit (HOSPITAL_COMMUNITY): Payer: Self-pay | Admitting: Orthopedic Surgery

## 2020-02-14 DIAGNOSIS — Z1211 Encounter for screening for malignant neoplasm of colon: Secondary | ICD-10-CM | POA: Diagnosis not present

## 2020-02-14 DIAGNOSIS — E119 Type 2 diabetes mellitus without complications: Secondary | ICD-10-CM | POA: Diagnosis not present

## 2020-02-14 DIAGNOSIS — I2510 Atherosclerotic heart disease of native coronary artery without angina pectoris: Secondary | ICD-10-CM | POA: Diagnosis not present

## 2020-02-14 DIAGNOSIS — I10 Essential (primary) hypertension: Secondary | ICD-10-CM | POA: Diagnosis not present

## 2020-02-14 MED FILL — HYDROCODON-APAP 5-325: 5-325 | 30 days supply | Qty: 30 | Fill #0

## 2020-02-15 ENCOUNTER — Telehealth: Payer: Self-pay | Admitting: *Deleted

## 2020-02-15 NOTE — Telephone Encounter (Signed)
°  Follow up Call-  Call back number 02/13/2020  Post procedure Call Back phone  # [phone]  Permission to leave phone message Yes  Some recent data might be hidden     No answer at 2nd attempt follow up phone call.  Left message on voicemail.

## 2020-02-15 NOTE — Telephone Encounter (Signed)
No answer for post procedure call back. :Left message for patient to call with questions or concerns and will call back later today.

## 2020-02-18 ENCOUNTER — Other Ambulatory Visit (HOSPITAL_COMMUNITY): Payer: Self-pay | Admitting: Orthopedic Surgery

## 2020-02-18 DIAGNOSIS — M25562 Pain in left knee: Secondary | ICD-10-CM | POA: Diagnosis not present

## 2020-02-18 MED FILL — OXYCODONE-APAP 5-325MG: 5-325 | 5 days supply | Qty: 40 | Fill #0

## 2020-02-18 MED FILL — tiZANidine HCL 4 MG TABS: 4 | 10 days supply | Qty: 40 | Fill #0

## 2020-02-21 DIAGNOSIS — M1712 Unilateral primary osteoarthritis, left knee: Secondary | ICD-10-CM | POA: Diagnosis not present

## 2020-02-21 DIAGNOSIS — Z01818 Encounter for other preprocedural examination: Secondary | ICD-10-CM | POA: Diagnosis not present

## 2020-02-21 DIAGNOSIS — Z01812 Encounter for preprocedural laboratory examination: Secondary | ICD-10-CM | POA: Diagnosis not present

## 2020-02-25 ENCOUNTER — Other Ambulatory Visit (HOSPITAL_COMMUNITY): Payer: Self-pay | Admitting: Internal Medicine

## 2020-02-25 ENCOUNTER — Encounter: Payer: Self-pay | Admitting: Gastroenterology

## 2020-02-25 MED FILL — FUROSEMIDE 40 MG TAB: 40 | 30 days supply | Qty: 30 | Fill #1

## 2020-02-25 MED FILL — HUMALOG 100 UNITS/ML KWIKPE: 100 | 25 days supply | Qty: 15 | Fill #0

## 2020-02-25 MED FILL — BD PEN NDL NANO 32GX5/32: 32G X 4 MM | 29 days supply | Qty: 200 | Fill #5

## 2020-02-25 MED FILL — XIGDUO XR 10-500 MG TB24: 10-500 | 30 days supply | Qty: 30 | Fill #2

## 2020-02-27 ENCOUNTER — Other Ambulatory Visit (HOSPITAL_COMMUNITY): Payer: Self-pay | Admitting: Endocrinology

## 2020-02-27 DIAGNOSIS — E1165 Type 2 diabetes mellitus with hyperglycemia: Secondary | ICD-10-CM | POA: Diagnosis not present

## 2020-02-27 DIAGNOSIS — I10 Essential (primary) hypertension: Secondary | ICD-10-CM | POA: Diagnosis not present

## 2020-02-27 MED FILL — METOPROLOL SUCCINATE ER 50: 50 | 30 days supply | Qty: 30 | Fill #0

## 2020-02-28 DIAGNOSIS — G8918 Other acute postprocedural pain: Secondary | ICD-10-CM | POA: Diagnosis not present

## 2020-02-28 DIAGNOSIS — M1712 Unilateral primary osteoarthritis, left knee: Secondary | ICD-10-CM | POA: Diagnosis not present

## 2020-02-28 DIAGNOSIS — Z96652 Presence of left artificial knee joint: Secondary | ICD-10-CM | POA: Diagnosis not present

## 2020-03-01 DIAGNOSIS — E119 Type 2 diabetes mellitus without complications: Secondary | ICD-10-CM | POA: Diagnosis not present

## 2020-03-01 DIAGNOSIS — M65322 Trigger finger, left index finger: Secondary | ICD-10-CM | POA: Diagnosis not present

## 2020-03-01 DIAGNOSIS — M65332 Trigger finger, left middle finger: Secondary | ICD-10-CM | POA: Diagnosis not present

## 2020-03-01 DIAGNOSIS — Z471 Aftercare following joint replacement surgery: Secondary | ICD-10-CM | POA: Diagnosis not present

## 2020-03-01 DIAGNOSIS — Z7984 Long term (current) use of oral hypoglycemic drugs: Secondary | ICD-10-CM | POA: Diagnosis not present

## 2020-03-01 DIAGNOSIS — Z794 Long term (current) use of insulin: Secondary | ICD-10-CM | POA: Diagnosis not present

## 2020-03-01 DIAGNOSIS — M7702 Medial epicondylitis, left elbow: Secondary | ICD-10-CM | POA: Diagnosis not present

## 2020-03-03 DIAGNOSIS — M65332 Trigger finger, left middle finger: Secondary | ICD-10-CM | POA: Diagnosis not present

## 2020-03-03 DIAGNOSIS — M7702 Medial epicondylitis, left elbow: Secondary | ICD-10-CM | POA: Diagnosis not present

## 2020-03-03 DIAGNOSIS — M65322 Trigger finger, left index finger: Secondary | ICD-10-CM | POA: Diagnosis not present

## 2020-03-03 DIAGNOSIS — Z471 Aftercare following joint replacement surgery: Secondary | ICD-10-CM | POA: Diagnosis not present

## 2020-03-03 DIAGNOSIS — Z794 Long term (current) use of insulin: Secondary | ICD-10-CM | POA: Diagnosis not present

## 2020-03-03 DIAGNOSIS — E119 Type 2 diabetes mellitus without complications: Secondary | ICD-10-CM | POA: Diagnosis not present

## 2020-03-03 DIAGNOSIS — Z7984 Long term (current) use of oral hypoglycemic drugs: Secondary | ICD-10-CM | POA: Diagnosis not present

## 2020-03-04 ENCOUNTER — Telehealth: Payer: Self-pay

## 2020-03-04 ENCOUNTER — Other Ambulatory Visit (HOSPITAL_BASED_OUTPATIENT_CLINIC_OR_DEPARTMENT_OTHER): Payer: Self-pay | Admitting: Orthopedic Surgery

## 2020-03-04 DIAGNOSIS — M65322 Trigger finger, left index finger: Secondary | ICD-10-CM | POA: Diagnosis not present

## 2020-03-04 DIAGNOSIS — Z7984 Long term (current) use of oral hypoglycemic drugs: Secondary | ICD-10-CM | POA: Diagnosis not present

## 2020-03-04 DIAGNOSIS — Z794 Long term (current) use of insulin: Secondary | ICD-10-CM | POA: Diagnosis not present

## 2020-03-04 DIAGNOSIS — E119 Type 2 diabetes mellitus without complications: Secondary | ICD-10-CM | POA: Diagnosis not present

## 2020-03-04 DIAGNOSIS — M7702 Medial epicondylitis, left elbow: Secondary | ICD-10-CM | POA: Diagnosis not present

## 2020-03-04 DIAGNOSIS — M65332 Trigger finger, left middle finger: Secondary | ICD-10-CM | POA: Diagnosis not present

## 2020-03-04 DIAGNOSIS — Z471 Aftercare following joint replacement surgery: Secondary | ICD-10-CM | POA: Diagnosis not present

## 2020-03-04 MED FILL — OXYCODONE-ACETAMINOPHEN 5-3: 5-325 | 5 days supply | Qty: 40 | Fill #0

## 2020-03-04 NOTE — Telephone Encounter (Signed)
Called to schedule lipid labs and the pt stated no time soon as they have just had knee replacement

## 2020-03-04 NOTE — Telephone Encounter (Signed)
-----   Message from Ramond Dial, [place] sent at 03/04/2020  7:19 AM EST -----  ----- Message ----- From: Ramond Dial, RPH-CPP Sent: 03/04/2020 To: Ramond Dial, RPH-CPP  Set up lipid labs

## 2020-03-05 ENCOUNTER — Other Ambulatory Visit (HOSPITAL_BASED_OUTPATIENT_CLINIC_OR_DEPARTMENT_OTHER): Payer: Self-pay | Admitting: Orthopedic Surgery

## 2020-03-05 MED FILL — tiZANidine HCL 4 MG TABS: 4 | 10 days supply | Qty: 40 | Fill #0

## 2020-03-06 DIAGNOSIS — M65332 Trigger finger, left middle finger: Secondary | ICD-10-CM | POA: Diagnosis not present

## 2020-03-06 DIAGNOSIS — Z471 Aftercare following joint replacement surgery: Secondary | ICD-10-CM | POA: Diagnosis not present

## 2020-03-06 DIAGNOSIS — M7702 Medial epicondylitis, left elbow: Secondary | ICD-10-CM | POA: Diagnosis not present

## 2020-03-06 DIAGNOSIS — M65322 Trigger finger, left index finger: Secondary | ICD-10-CM | POA: Diagnosis not present

## 2020-03-06 DIAGNOSIS — Z7984 Long term (current) use of oral hypoglycemic drugs: Secondary | ICD-10-CM | POA: Diagnosis not present

## 2020-03-06 DIAGNOSIS — E119 Type 2 diabetes mellitus without complications: Secondary | ICD-10-CM | POA: Diagnosis not present

## 2020-03-06 DIAGNOSIS — Z794 Long term (current) use of insulin: Secondary | ICD-10-CM | POA: Diagnosis not present

## 2020-03-07 DIAGNOSIS — Z794 Long term (current) use of insulin: Secondary | ICD-10-CM | POA: Diagnosis not present

## 2020-03-07 DIAGNOSIS — E119 Type 2 diabetes mellitus without complications: Secondary | ICD-10-CM | POA: Diagnosis not present

## 2020-03-07 DIAGNOSIS — M7702 Medial epicondylitis, left elbow: Secondary | ICD-10-CM | POA: Diagnosis not present

## 2020-03-07 DIAGNOSIS — M65332 Trigger finger, left middle finger: Secondary | ICD-10-CM | POA: Diagnosis not present

## 2020-03-07 DIAGNOSIS — Z7984 Long term (current) use of oral hypoglycemic drugs: Secondary | ICD-10-CM | POA: Diagnosis not present

## 2020-03-07 DIAGNOSIS — M65322 Trigger finger, left index finger: Secondary | ICD-10-CM | POA: Diagnosis not present

## 2020-03-07 DIAGNOSIS — Z471 Aftercare following joint replacement surgery: Secondary | ICD-10-CM | POA: Diagnosis not present

## 2020-03-10 ENCOUNTER — Other Ambulatory Visit (HOSPITAL_BASED_OUTPATIENT_CLINIC_OR_DEPARTMENT_OTHER): Payer: Self-pay | Admitting: Orthopedic Surgery

## 2020-03-10 ENCOUNTER — Ambulatory Visit: Payer: 59 | Admitting: Physical Therapy

## 2020-03-10 DIAGNOSIS — Z9889 Other specified postprocedural states: Secondary | ICD-10-CM | POA: Diagnosis not present

## 2020-03-10 MED FILL — OXYCODONE/APAP 7.5/325MG: 7.5-325 | 5 days supply | Qty: 40 | Fill #0

## 2020-03-10 MED FILL — CELECOXIB 200 MG CAPS: 200 | 30 days supply | Qty: 30 | Fill #0

## 2020-03-10 MED FILL — METHOCARBAMOL 750 MG TABS: 750 | 10 days supply | Qty: 40 | Fill #0

## 2020-03-12 ENCOUNTER — Other Ambulatory Visit: Payer: Self-pay

## 2020-03-12 ENCOUNTER — Encounter: Payer: Self-pay | Admitting: Physical Therapy

## 2020-03-12 ENCOUNTER — Ambulatory Visit: Payer: 59 | Attending: Orthopaedic Surgery | Admitting: Physical Therapy

## 2020-03-12 VITALS — BP 90/80 | HR 108

## 2020-03-12 DIAGNOSIS — M25662 Stiffness of left knee, not elsewhere classified: Secondary | ICD-10-CM | POA: Diagnosis not present

## 2020-03-12 DIAGNOSIS — R29898 Other symptoms and signs involving the musculoskeletal system: Secondary | ICD-10-CM | POA: Insufficient documentation

## 2020-03-12 DIAGNOSIS — M25562 Pain in left knee: Secondary | ICD-10-CM | POA: Diagnosis not present

## 2020-03-12 DIAGNOSIS — R2689 Other abnormalities of gait and mobility: Secondary | ICD-10-CM | POA: Insufficient documentation
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
## 2020-03-17 ENCOUNTER — Encounter: Payer: Self-pay | Admitting: Physical Therapy

## 2020-03-17 ENCOUNTER — Other Ambulatory Visit: Payer: Self-pay

## 2020-03-17 ENCOUNTER — Ambulatory Visit: Payer: 59 | Admitting: Physical Therapy

## 2020-03-17 DIAGNOSIS — M25662 Stiffness of left knee, not elsewhere classified: Secondary | ICD-10-CM

## 2020-03-17 DIAGNOSIS — R29898 Other symptoms and signs involving the musculoskeletal system: Secondary | ICD-10-CM

## 2020-03-17 DIAGNOSIS — M25562 Pain in left knee: Secondary | ICD-10-CM

## 2020-03-17 DIAGNOSIS — R2689 Other abnormalities of gait and mobility: Secondary | ICD-10-CM | POA: Diagnosis not present
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
## 2020-03-20 ENCOUNTER — Other Ambulatory Visit (HOSPITAL_BASED_OUTPATIENT_CLINIC_OR_DEPARTMENT_OTHER): Payer: Self-pay | Admitting: Orthopedic Surgery

## 2020-03-20 ENCOUNTER — Ambulatory Visit: Payer: 59

## 2020-03-20 MED FILL — OXYCODONE/APAP 7.5/325MG: 7.5-325 | 5 days supply | Qty: 40 | Fill #0

## 2020-03-24 ENCOUNTER — Other Ambulatory Visit (HOSPITAL_BASED_OUTPATIENT_CLINIC_OR_DEPARTMENT_OTHER): Payer: Self-pay | Admitting: Orthopedic Surgery

## 2020-03-24 MED FILL — tiZANidine HCL 4 MG TABS: 4 | 10 days supply | Qty: 40 | Fill #0

## 2020-03-24 MED FILL — HUMALOG 100 UNITS/ML KWIKPE: 100 | 25 days supply | Qty: 15 | Fill #1

## 2020-03-25 ENCOUNTER — Other Ambulatory Visit: Payer: Self-pay

## 2020-03-25 ENCOUNTER — Ambulatory Visit: Payer: 59 | Attending: Orthopaedic Surgery

## 2020-03-25 DIAGNOSIS — R2689 Other abnormalities of gait and mobility: Secondary | ICD-10-CM | POA: Diagnosis not present

## 2020-03-25 DIAGNOSIS — M25662 Stiffness of left knee, not elsewhere classified: Secondary | ICD-10-CM | POA: Insufficient documentation

## 2020-03-25 DIAGNOSIS — R29898 Other symptoms and signs involving the musculoskeletal system: Secondary | ICD-10-CM | POA: Insufficient documentation

## 2020-03-25 DIAGNOSIS — M25562 Pain in left knee: Secondary | ICD-10-CM | POA: Insufficient documentation
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
## 2020-03-26 MED FILL — XIGDUO XR 10-500 MG TB24: 10-500 | 30 days supply | Qty: 30 | Fill #3

## 2020-03-27 ENCOUNTER — Ambulatory Visit: Payer: 59

## 2020-04-01 ENCOUNTER — Ambulatory Visit: Payer: 59

## 2020-04-01 ENCOUNTER — Other Ambulatory Visit: Payer: Self-pay

## 2020-04-01 DIAGNOSIS — R2689 Other abnormalities of gait and mobility: Secondary | ICD-10-CM | POA: Diagnosis not present

## 2020-04-01 DIAGNOSIS — M25662 Stiffness of left knee, not elsewhere classified: Secondary | ICD-10-CM

## 2020-04-01 DIAGNOSIS — M25562 Pain in left knee: Secondary | ICD-10-CM | POA: Diagnosis not present

## 2020-04-01 DIAGNOSIS — R29898 Other symptoms and signs involving the musculoskeletal system: Secondary | ICD-10-CM | POA: Diagnosis not present
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
## 2020-04-03 ENCOUNTER — Ambulatory Visit: Payer: 59 | Admitting: Physical Therapy

## 2020-04-03 ENCOUNTER — Encounter: Payer: Self-pay | Admitting: Physical Therapy

## 2020-04-03 ENCOUNTER — Other Ambulatory Visit: Payer: Self-pay

## 2020-04-03 DIAGNOSIS — R2689 Other abnormalities of gait and mobility: Secondary | ICD-10-CM | POA: Diagnosis not present

## 2020-04-03 DIAGNOSIS — R29898 Other symptoms and signs involving the musculoskeletal system: Secondary | ICD-10-CM | POA: Diagnosis not present

## 2020-04-03 DIAGNOSIS — M25662 Stiffness of left knee, not elsewhere classified: Secondary | ICD-10-CM

## 2020-04-03 DIAGNOSIS — M25562 Pain in left knee: Secondary | ICD-10-CM | POA: Diagnosis not present
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
## 2020-04-08 ENCOUNTER — Other Ambulatory Visit: Payer: Self-pay

## 2020-04-08 ENCOUNTER — Ambulatory Visit: Payer: 59

## 2020-04-08 DIAGNOSIS — R2689 Other abnormalities of gait and mobility: Secondary | ICD-10-CM | POA: Diagnosis not present

## 2020-04-08 DIAGNOSIS — M25562 Pain in left knee: Secondary | ICD-10-CM | POA: Diagnosis not present

## 2020-04-08 DIAGNOSIS — M25662 Stiffness of left knee, not elsewhere classified: Secondary | ICD-10-CM

## 2020-04-08 DIAGNOSIS — R29898 Other symptoms and signs involving the musculoskeletal system: Secondary | ICD-10-CM
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
## 2020-04-10 ENCOUNTER — Ambulatory Visit: Payer: 59

## 2020-04-10 ENCOUNTER — Other Ambulatory Visit (HOSPITAL_BASED_OUTPATIENT_CLINIC_OR_DEPARTMENT_OTHER): Payer: Self-pay | Admitting: Surgical

## 2020-04-10 ENCOUNTER — Other Ambulatory Visit: Payer: Self-pay

## 2020-04-10 DIAGNOSIS — M25562 Pain in left knee: Secondary | ICD-10-CM

## 2020-04-10 DIAGNOSIS — R29898 Other symptoms and signs involving the musculoskeletal system: Secondary | ICD-10-CM | POA: Diagnosis not present

## 2020-04-10 DIAGNOSIS — R2689 Other abnormalities of gait and mobility: Secondary | ICD-10-CM | POA: Diagnosis not present

## 2020-04-10 DIAGNOSIS — M25662 Stiffness of left knee, not elsewhere classified: Secondary | ICD-10-CM | POA: Diagnosis not present

## 2020-04-10 MED FILL — TOUJEO SOLOSTAR 300 UNITS/M: 300 | 83 days supply | Qty: 18 | Fill #4

## 2020-04-10 MED FILL — REPATHA SURECLICK 140 MG/ML: 140 | 28 days supply | Qty: 2 | Fill #4

## 2020-04-10 MED FILL — BD PEN NDL NANO 32GX5/32: 32G X 4 MM | 29 days supply | Qty: 200 | Fill #6

## 2020-04-10 MED FILL — tiZANidine HCL 4 MG TABS: 4 | 10 days supply | Qty: 40 | Fill #0

## 2020-04-10 MED FILL — OXYCODONE/APAP 7.5/325MG: 7.5-325 | 6 days supply | Qty: 40 | Fill #0

## 2020-04-10 MED FILL — CELECOXIB 200 MG CAP: 200 | 90 days supply | Qty: 90 | Fill #0
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
## 2020-04-14 MED FILL — HUMALOG 100 UNITS/ML KWIKPE: 100 | 25 days supply | Qty: 15 | Fill #2

## 2020-04-15 ENCOUNTER — Ambulatory Visit: Payer: 59

## 2020-04-15 ENCOUNTER — Other Ambulatory Visit: Payer: Self-pay

## 2020-04-15 DIAGNOSIS — M25562 Pain in left knee: Secondary | ICD-10-CM

## 2020-04-15 DIAGNOSIS — M25662 Stiffness of left knee, not elsewhere classified: Secondary | ICD-10-CM

## 2020-04-15 DIAGNOSIS — R29898 Other symptoms and signs involving the musculoskeletal system: Secondary | ICD-10-CM | POA: Diagnosis not present

## 2020-04-15 DIAGNOSIS — R2689 Other abnormalities of gait and mobility: Secondary | ICD-10-CM | POA: Diagnosis not present
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
## 2020-04-16 MED FILL — METOPROLOL SUCCINATE ER 50: 50 | 30 days supply | Qty: 30 | Fill #1

## 2020-04-16 MED FILL — METHOCARBAMOL 750 MG TABS: 750 | 10 days supply | Qty: 40 | Fill #0

## 2020-04-17 ENCOUNTER — Other Ambulatory Visit: Payer: Self-pay

## 2020-04-17 ENCOUNTER — Ambulatory Visit: Payer: 59

## 2020-04-17 DIAGNOSIS — M25562 Pain in left knee: Secondary | ICD-10-CM

## 2020-04-17 DIAGNOSIS — R2689 Other abnormalities of gait and mobility: Secondary | ICD-10-CM | POA: Diagnosis not present

## 2020-04-17 DIAGNOSIS — R29898 Other symptoms and signs involving the musculoskeletal system: Secondary | ICD-10-CM | POA: Diagnosis not present

## 2020-04-17 DIAGNOSIS — M25662 Stiffness of left knee, not elsewhere classified: Secondary | ICD-10-CM
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
## 2020-04-21 ENCOUNTER — Encounter: Payer: Self-pay | Admitting: Physical Therapy

## 2020-04-21 ENCOUNTER — Ambulatory Visit: Payer: 59 | Attending: Orthopaedic Surgery | Admitting: Physical Therapy

## 2020-04-21 ENCOUNTER — Other Ambulatory Visit: Payer: Self-pay

## 2020-04-21 DIAGNOSIS — M25562 Pain in left knee: Secondary | ICD-10-CM | POA: Insufficient documentation

## 2020-04-21 DIAGNOSIS — R29898 Other symptoms and signs involving the musculoskeletal system: Secondary | ICD-10-CM | POA: Diagnosis not present

## 2020-04-21 DIAGNOSIS — M25662 Stiffness of left knee, not elsewhere classified: Secondary | ICD-10-CM | POA: Diagnosis not present

## 2020-04-21 DIAGNOSIS — R2689 Other abnormalities of gait and mobility: Secondary | ICD-10-CM | POA: Insufficient documentation
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
## 2020-04-22 MED FILL — XIGDUO XR 10-500 MG TB24: 10-500 | 30 days supply | Qty: 30 | Fill #4

## 2020-04-23 ENCOUNTER — Ambulatory Visit: Payer: 59 | Admitting: Physical Therapy

## 2020-04-24 ENCOUNTER — Ambulatory Visit: Payer: 59 | Admitting: Physical Therapy

## 2020-04-28 ENCOUNTER — Other Ambulatory Visit: Payer: Self-pay

## 2020-04-28 ENCOUNTER — Ambulatory Visit: Payer: 59

## 2020-04-28 DIAGNOSIS — R2689 Other abnormalities of gait and mobility: Secondary | ICD-10-CM | POA: Diagnosis not present

## 2020-04-28 DIAGNOSIS — M25562 Pain in left knee: Secondary | ICD-10-CM

## 2020-04-28 DIAGNOSIS — M25662 Stiffness of left knee, not elsewhere classified: Secondary | ICD-10-CM | POA: Diagnosis not present

## 2020-04-28 DIAGNOSIS — R29898 Other symptoms and signs involving the musculoskeletal system: Secondary | ICD-10-CM
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
## 2020-04-30 ENCOUNTER — Ambulatory Visit: Payer: 59 | Admitting: Physical Therapy

## 2020-04-30 ENCOUNTER — Other Ambulatory Visit (HOSPITAL_BASED_OUTPATIENT_CLINIC_OR_DEPARTMENT_OTHER): Payer: Self-pay | Admitting: Orthopedic Surgery

## 2020-04-30 MED FILL — OXYCODONE/APAP 7.5/325MG: 7.5-325 | 15 days supply | Qty: 30 | Fill #0

## 2020-05-05 ENCOUNTER — Ambulatory Visit: Payer: 59

## 2020-05-06 ENCOUNTER — Telehealth: Payer: Self-pay

## 2020-05-06 NOTE — Telephone Encounter (Signed)
lmom for lipid lab appt needed

## 2020-05-06 NOTE — Telephone Encounter (Signed)
-----   Message from Ramond Dial, [place] sent at 05/06/2020 12:12 PM EST ----- Please see if pt is now willing to come for labs ----- Message ----- From: Ramond Dial, RPH-CPP Sent: 05/06/2020  12:00 AM EST To: Ramond Dial, RPH-CPP   ----- Message ----- From: Ramond Dial, RPH-CPP Sent: 04/08/2020  12:00 AM EST To: Ramond Dial, RPH-CPP   ----- Message ----- From: Allean Found, CMA Sent: 03/04/2020   9:27 AM EST To: Ramond Dial, RPH-CPP  He stated not anytime soon as he just had knee replacement ----- Message ----- From: Ramond Dial, RPH-CPP Sent: 03/04/2020   7:19 AM EST To: Allean Found, CMA   ----- Message ----- From: Ramond Dial, RPH-CPP Sent: 03/04/2020 To: Ramond Dial, RPH-CPP  Set up lipid labs

## 2020-05-07 ENCOUNTER — Ambulatory Visit: Payer: 59 | Admitting: Physical Therapy

## 2020-05-08 MED FILL — HUMALOG 100 UNITS/ML KWIKPE: 100 | 25 days supply | Qty: 15 | Fill #3

## 2020-05-12 ENCOUNTER — Other Ambulatory Visit: Payer: Self-pay

## 2020-05-12 ENCOUNTER — Ambulatory Visit: Payer: 59

## 2020-05-12 DIAGNOSIS — M25562 Pain in left knee: Secondary | ICD-10-CM

## 2020-05-12 DIAGNOSIS — M25662 Stiffness of left knee, not elsewhere classified: Secondary | ICD-10-CM | POA: Diagnosis not present

## 2020-05-12 DIAGNOSIS — R29898 Other symptoms and signs involving the musculoskeletal system: Secondary | ICD-10-CM | POA: Diagnosis not present

## 2020-05-12 DIAGNOSIS — R2689 Other abnormalities of gait and mobility: Secondary | ICD-10-CM | POA: Diagnosis not present
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
## 2020-05-14 ENCOUNTER — Ambulatory Visit: Payer: 59 | Admitting: Physical Therapy

## 2020-05-23 ENCOUNTER — Ambulatory Visit: Payer: 59 | Admitting: Physical Therapy

## 2020-05-23 ENCOUNTER — Other Ambulatory Visit (HOSPITAL_BASED_OUTPATIENT_CLINIC_OR_DEPARTMENT_OTHER): Payer: Self-pay | Admitting: Orthopedic Surgery

## 2020-05-23 DIAGNOSIS — Z9889 Other specified postprocedural states: Secondary | ICD-10-CM | POA: Diagnosis not present

## 2020-05-23 MED FILL — OXYCODONE-ACETAMINOPHEN 5-3: 5-325 | 20 days supply | Qty: 20 | Fill #0

## 2020-05-27 ENCOUNTER — Ambulatory Visit: Payer: 59

## 2020-05-28 MED FILL — XIGDUO XR 10-500 MG TB24: 10-500 | 30 days supply | Qty: 30 | Fill #5

## 2020-05-29 ENCOUNTER — Encounter: Payer: 59 | Admitting: Physical Therapy

## 2020-05-29 ENCOUNTER — Other Ambulatory Visit (HOSPITAL_COMMUNITY): Payer: Self-pay | Admitting: Internal Medicine

## 2020-05-29 MED FILL — REPATHA SURECLICK 140 MG/ML: 140 | 28 days supply | Qty: 2 | Fill #5

## 2020-05-29 MED FILL — HUMALOG 100 UNITS/ML KWIKPE: 100 | 25 days supply | Qty: 15 | Fill #4

## 2020-05-29 MED FILL — BD PEN NDL NANO 32GX5/32: 32G X 4 MM | 29 days supply | Qty: 200 | Fill #0

## 2020-06-03 ENCOUNTER — Encounter: Payer: 59 | Admitting: Physical Therapy

## 2020-06-05 ENCOUNTER — Encounter: Payer: 59 | Admitting: Physical Therapy
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
## 2020-06-19 ENCOUNTER — Ambulatory Visit: Payer: 59 | Admitting: Internal Medicine

## 2020-06-19 ENCOUNTER — Other Ambulatory Visit: Payer: Self-pay

## 2020-06-23 MED FILL — TOUJEO SOLOSTAR 300 UNITS/M: 300 | 83 days supply | Qty: 18 | Fill #5

## 2020-06-23 MED FILL — HUMALOG 100 UNITS/ML KWIKPE: 100 | 25 days supply | Qty: 15 | Fill #5

## 2020-06-23 MED FILL — REPATHA SURECLICK 140 MG/ML: 140 | 28 days supply | Qty: 2 | Fill #6

## 2020-07-03 ENCOUNTER — Other Ambulatory Visit (HOSPITAL_COMMUNITY): Payer: Self-pay | Admitting: Endocrinology

## 2020-07-03 DIAGNOSIS — R609 Edema, unspecified: Secondary | ICD-10-CM | POA: Diagnosis not present

## 2020-07-03 DIAGNOSIS — E1165 Type 2 diabetes mellitus with hyperglycemia: Secondary | ICD-10-CM | POA: Diagnosis not present
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
## 2020-07-07 ENCOUNTER — Encounter: Payer: Self-pay | Admitting: Internal Medicine

## 2020-07-07 ENCOUNTER — Ambulatory Visit (INDEPENDENT_AMBULATORY_CARE_PROVIDER_SITE_OTHER): Payer: 59 | Admitting: Internal Medicine

## 2020-07-07 ENCOUNTER — Other Ambulatory Visit: Payer: Self-pay | Admitting: Internal Medicine

## 2020-07-07 ENCOUNTER — Other Ambulatory Visit: Payer: Self-pay

## 2020-07-07 VITALS — BP 148/98 | HR 96 | Ht 72.0 in | Wt 247.6 lb

## 2020-07-07 DIAGNOSIS — I10 Essential (primary) hypertension: Secondary | ICD-10-CM | POA: Diagnosis not present

## 2020-07-07 DIAGNOSIS — Z79899 Other long term (current) drug therapy: Secondary | ICD-10-CM

## 2020-07-07 MED ORDER — LOSARTAN POTASSIUM 25 MG PO TABS: 25.0000 mg | ORAL_TABLET | Freq: Every day | ORAL | 3 refills | Status: DC

## 2020-07-07 MED FILL — LOSARTAN POTASSIUM 25 MG TA: 25 | 30 days supply | Qty: 30 | Fill #0

## 2020-07-07 NOTE — Patient Instructions (Signed)
Medication Instructions:  Please start Losartan 25 mg a day. Continue all other medications as listed.  *If you need a refill on your cardiac medications before your next appointment, please call your pharmacy*   Lab Work: Please have blood work in 2 weeks (BMP)  If you have labs (blood work) drawn today and your tests are completely normal, you will receive your results only by: Marland Kitchen MyChart Message (if you have MyChart) OR . A paper copy in the mail If you have any lab test that is abnormal or we need to change your treatment, we will call you to review the results.  Follow-Up: At [facility], you and your health needs are our priority.  As part of our continuing mission to provide you with exceptional heart care, we have created designated Provider Care Teams.  These Care Teams include your primary Cardiologist (physician) and Advanced Practice Providers (APPs -  Physician Assistants and Nurse Practitioners) who all work together to provide you with the care you need, when you need it.  We recommend signing up for the patient portal called "MyChart".  Sign up information is provided on this After Visit Summary.  MyChart is used to connect with patients for Virtual Visits (Telemedicine).  Patients are able to view lab/test results, encounter notes, upcoming appointments, etc.  Non-urgent messages can be sent to your provider as well.   To learn more about what you can do with MyChart, go to [email or web address].    Your next appointment:   6 month(s)  The format for your next appointment:   In Person  Provider:   Dorris Carnes, MD  Thank you for choosing [facility]!!

## 2020-07-09 MED FILL — CELECOXIB 200 MG CAP: 200 | 90 days supply | Qty: 90 | Fill #0

## 2020-07-21 ENCOUNTER — Other Ambulatory Visit (HOSPITAL_COMMUNITY): Payer: Self-pay | Admitting: Surgical

## 2020-07-21 ENCOUNTER — Other Ambulatory Visit (HOSPITAL_COMMUNITY): Payer: Self-pay | Admitting: Orthopedic Surgery

## 2020-07-21 ENCOUNTER — Other Ambulatory Visit (HOSPITAL_COMMUNITY): Payer: Self-pay

## 2020-07-21 MED FILL — Evolocumab Subcutaneous Soln Auto-Injector 140 MG/ML: SUBCUTANEOUS | 30 days supply | Qty: 2 | Fill #0 | Status: AC

## 2020-07-21 MED FILL — Insulin Lispro Soln Pen-injector 100 Unit/ML (1 Unit Dial): SUBCUTANEOUS | 25 days supply | Qty: 15 | Fill #0 | Status: AC

## 2020-07-22 ENCOUNTER — Other Ambulatory Visit: Payer: 59

## 2020-08-01 ENCOUNTER — Other Ambulatory Visit (HOSPITAL_COMMUNITY): Payer: Self-pay

## 2020-08-04 ENCOUNTER — Other Ambulatory Visit (HOSPITAL_COMMUNITY): Payer: Self-pay

## 2020-08-04 MED FILL — Dapagliflozin Prop-Metformin HCl Tab ER 24HR 10-500 MG: ORAL | 30 days supply | Qty: 30 | Fill #0 | Status: AC

## 2020-08-06 ENCOUNTER — Other Ambulatory Visit (HOSPITAL_COMMUNITY): Payer: Self-pay

## 2020-08-06 ENCOUNTER — Other Ambulatory Visit (HOSPITAL_COMMUNITY): Payer: Self-pay | Admitting: Surgical

## 2020-08-06 ENCOUNTER — Other Ambulatory Visit (HOSPITAL_COMMUNITY): Payer: Self-pay | Admitting: Orthopedic Surgery

## 2020-08-12 ENCOUNTER — Other Ambulatory Visit (HOSPITAL_COMMUNITY): Payer: Self-pay | Admitting: Orthopedic Surgery

## 2020-08-12 ENCOUNTER — Other Ambulatory Visit (HOSPITAL_COMMUNITY): Payer: Self-pay | Admitting: Surgical

## 2020-08-12 ENCOUNTER — Other Ambulatory Visit (HOSPITAL_COMMUNITY): Payer: Self-pay

## 2020-08-14 ENCOUNTER — Other Ambulatory Visit (HOSPITAL_COMMUNITY): Payer: Self-pay

## 2020-08-14 MED FILL — Insulin Pen Needle 32 G X 4 MM (1/6" or 5/32"): 28 days supply | Qty: 200 | Fill #0 | Status: AC

## 2020-08-14 MED FILL — Losartan Potassium Tab 25 MG: ORAL | 90 days supply | Qty: 90 | Fill #0 | Status: AC

## 2020-08-14 MED FILL — Insulin Lispro Soln Pen-injector 100 Unit/ML (1 Unit Dial): SUBCUTANEOUS | 25 days supply | Qty: 15 | Fill #1 | Status: AC

## 2020-08-18 ENCOUNTER — Other Ambulatory Visit (HOSPITAL_COMMUNITY): Payer: Self-pay | Admitting: Orthopedic Surgery

## 2020-08-18 ENCOUNTER — Other Ambulatory Visit (HOSPITAL_COMMUNITY): Payer: Self-pay | Admitting: Surgical

## 2020-08-18 ENCOUNTER — Other Ambulatory Visit (HOSPITAL_COMMUNITY): Payer: Self-pay

## 2020-08-19 ENCOUNTER — Other Ambulatory Visit (HOSPITAL_COMMUNITY): Payer: Self-pay

## 2020-08-22 ENCOUNTER — Other Ambulatory Visit (HOSPITAL_COMMUNITY): Payer: Self-pay | Admitting: Surgical

## 2020-08-22 ENCOUNTER — Other Ambulatory Visit (HOSPITAL_COMMUNITY): Payer: Self-pay

## 2020-09-11 ENCOUNTER — Other Ambulatory Visit (HOSPITAL_BASED_OUTPATIENT_CLINIC_OR_DEPARTMENT_OTHER): Payer: Self-pay

## 2020-09-11 ENCOUNTER — Other Ambulatory Visit (HOSPITAL_COMMUNITY): Payer: Self-pay

## 2020-09-11 MED FILL — Dapagliflozin Prop-Metformin HCl Tab ER 24HR 10-500 MG: ORAL | 30 days supply | Qty: 30 | Fill #1 | Status: AC

## 2020-09-11 MED FILL — Insulin Lispro Soln Pen-injector 100 Unit/ML (1 Unit Dial): SUBCUTANEOUS | 25 days supply | Qty: 15 | Fill #2 | Status: AC

## 2020-09-12 ENCOUNTER — Other Ambulatory Visit (HOSPITAL_COMMUNITY): Payer: Self-pay

## 2020-09-12 ENCOUNTER — Other Ambulatory Visit (HOSPITAL_BASED_OUTPATIENT_CLINIC_OR_DEPARTMENT_OTHER): Payer: Self-pay

## 2020-09-12 MED FILL — Insulin Pen Needle 32 G X 4 MM (1/6" or 5/32"): 28 days supply | Qty: 200 | Fill #1 | Status: AC

## 2020-09-12 MED FILL — Evolocumab Subcutaneous Soln Auto-Injector 140 MG/ML: SUBCUTANEOUS | 30 days supply | Qty: 2 | Fill #1 | Status: AC

## 2020-09-16 ENCOUNTER — Other Ambulatory Visit (HOSPITAL_COMMUNITY): Payer: Self-pay

## 2020-09-16 MED ORDER — TOUJEO SOLOSTAR 300 UNIT/ML ~~LOC~~ SOPN
70.0000 [IU] | PEN_INJECTOR | Freq: Every day | SUBCUTANEOUS | 6 refills | Status: DC
  Filled 2020-09-16: qty 9, 38d supply, fill #0
  Filled 2020-10-29: qty 9, 38d supply, fill #1

## 2020-10-09 ENCOUNTER — Other Ambulatory Visit (HOSPITAL_COMMUNITY): Payer: Self-pay

## 2020-10-09 MED FILL — Celecoxib Cap 200 MG: ORAL | 90 days supply | Qty: 90 | Fill #0 | Status: AC

## 2020-10-09 MED FILL — Insulin Lispro Soln Pen-injector 100 Unit/ML (1 Unit Dial): SUBCUTANEOUS | 25 days supply | Qty: 15 | Fill #3 | Status: AC

## 2020-10-09 MED FILL — Dapagliflozin Prop-Metformin HCl Tab ER 24HR 10-500 MG: ORAL | 30 days supply | Qty: 30 | Fill #2 | Status: AC

## 2020-10-27 ENCOUNTER — Other Ambulatory Visit (HOSPITAL_COMMUNITY): Payer: Self-pay

## 2020-10-29 ENCOUNTER — Other Ambulatory Visit (HOSPITAL_COMMUNITY): Payer: Self-pay

## 2020-10-29 MED FILL — Evolocumab Subcutaneous Soln Auto-Injector 140 MG/ML: SUBCUTANEOUS | 30 days supply | Qty: 2 | Fill #2 | Status: AC

## 2020-10-29 MED FILL — Insulin Lispro Soln Pen-injector 100 Unit/ML (1 Unit Dial): SUBCUTANEOUS | 25 days supply | Qty: 15 | Fill #4 | Status: AC

## 2020-10-29 MED FILL — Dapagliflozin Prop-Metformin HCl Tab ER 24HR 10-500 MG: ORAL | 30 days supply | Qty: 30 | Fill #3 | Status: CN

## 2020-10-30 ENCOUNTER — Other Ambulatory Visit (HOSPITAL_COMMUNITY): Payer: Self-pay

## 2020-10-30 MED ORDER — CARESTART COVID-19 HOME TEST VI KIT
PACK | 0 refills | Status: DC
  Filled 2020-10-30: qty 4, 4d supply, fill #0

## 2020-11-03 ENCOUNTER — Other Ambulatory Visit (HOSPITAL_COMMUNITY): Payer: Self-pay

## 2020-11-03 MED FILL — Dapagliflozin Prop-Metformin HCl Tab ER 24HR 10-500 MG: ORAL | 30 days supply | Qty: 30 | Fill #3 | Status: AC

## 2020-11-13 ENCOUNTER — Other Ambulatory Visit (HOSPITAL_COMMUNITY): Payer: Self-pay

## 2020-11-13 MED ORDER — TRESIBA FLEXTOUCH 100 UNIT/ML ~~LOC~~ SOPN
70.0000 [IU] | PEN_INJECTOR | Freq: Every day | SUBCUTANEOUS | 1 refills | Status: DC
  Filled 2020-11-13: qty 30, 42d supply, fill #0
  Filled 2020-11-13: qty 60, 85d supply, fill #0

## 2020-11-14 ENCOUNTER — Other Ambulatory Visit (HOSPITAL_COMMUNITY): Payer: Self-pay
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
## 2020-12-01 ENCOUNTER — Ambulatory Visit: Payer: 59 | Admitting: Internal Medicine

## 2020-12-03 ENCOUNTER — Other Ambulatory Visit (HOSPITAL_COMMUNITY): Payer: Self-pay

## 2020-12-03 MED ORDER — CARESTART COVID-19 HOME TEST VI KIT
PACK | 0 refills | Status: DC
  Filled 2020-12-03: qty 2, 2d supply, fill #0

## 2020-12-03 MED FILL — Losartan Potassium Tab 25 MG: ORAL | 90 days supply | Qty: 90 | Fill #1 | Status: AC

## 2020-12-03 MED FILL — Dapagliflozin Prop-Metformin HCl Tab ER 24HR 10-500 MG: ORAL | 30 days supply | Qty: 30 | Fill #4 | Status: AC

## 2020-12-03 MED FILL — Insulin Lispro Soln Pen-injector 100 Unit/ML (1 Unit Dial): SUBCUTANEOUS | 25 days supply | Qty: 15 | Fill #5 | Status: AC

## 2020-12-04 ENCOUNTER — Other Ambulatory Visit (HOSPITAL_COMMUNITY): Payer: Self-pay

## 2020-12-04 MED ORDER — METOPROLOL SUCCINATE ER 50 MG PO TB24
ORAL_TABLET | ORAL | 4 refills | Status: DC
  Filled 2020-12-04: qty 90, 90d supply, fill #0
  Filled 2021-04-15: qty 90, 90d supply, fill #1

## 2020-12-04 MED FILL — Insulin Pen Needle 32 G X 4 MM (1/6" or 5/32"): 28 days supply | Qty: 200 | Fill #0 | Status: CN

## 2020-12-04 MED FILL — Insulin Pen Needle 32 G X 4 MM (1/6" or 5/32"): 28 days supply | Qty: 200 | Fill #0 | Status: AC

## 2020-12-09 ENCOUNTER — Other Ambulatory Visit (HOSPITAL_COMMUNITY): Payer: Self-pay

## 2021-01-05 ENCOUNTER — Other Ambulatory Visit (HOSPITAL_COMMUNITY): Payer: Self-pay

## 2021-01-05 ENCOUNTER — Other Ambulatory Visit: Payer: Self-pay | Admitting: Internal Medicine

## 2021-01-05 MED ORDER — REPATHA SURECLICK 140 MG/ML ~~LOC~~ SOAJ
140.0000 mg | SUBCUTANEOUS | 11 refills | Status: DC
  Filled 2021-01-05: qty 2, 28d supply, fill #0
  Filled 2021-02-03: qty 2, 28d supply, fill #1
  Filled 2021-03-05: qty 2, 28d supply, fill #2
  Filled 2021-04-09: qty 2, 28d supply, fill #3
  Filled 2021-05-14: qty 2, 28d supply, fill #4
  Filled 2021-06-04: qty 2, 28d supply, fill #5

## 2021-01-05 MED FILL — Insulin Pen Needle 32 G X 4 MM (1/6" or 5/32"): 28 days supply | Qty: 200 | Fill #2 | Status: AC

## 2021-01-05 MED FILL — Dapagliflozin Prop-Metformin HCl Tab ER 24HR 10-500 MG: ORAL | 30 days supply | Qty: 30 | Fill #5 | Status: AC

## 2021-01-05 MED FILL — Insulin Lispro Soln Pen-injector 100 Unit/ML (1 Unit Dial): SUBCUTANEOUS | 25 days supply | Qty: 15 | Fill #6 | Status: AC

## 2021-01-06 ENCOUNTER — Other Ambulatory Visit (HOSPITAL_COMMUNITY): Payer: Self-pay

## 2021-01-06 MED ORDER — TRESIBA FLEXTOUCH 100 UNIT/ML ~~LOC~~ SOPN
70.0000 [IU] | PEN_INJECTOR | Freq: Every day | SUBCUTANEOUS | 6 refills | Status: DC
  Filled 2021-01-06 – 2021-02-04 (×2): qty 30, 42d supply, fill #0
  Filled 2021-03-05: qty 60, 85d supply, fill #0
  Filled 2021-06-04: qty 21, 30d supply, fill #1

## 2021-01-07 ENCOUNTER — Other Ambulatory Visit (HOSPITAL_BASED_OUTPATIENT_CLINIC_OR_DEPARTMENT_OTHER): Payer: Self-pay

## 2021-01-07 ENCOUNTER — Other Ambulatory Visit (HOSPITAL_COMMUNITY): Payer: Self-pay

## 2021-01-07 MED ORDER — CELECOXIB 200 MG PO CAPS
ORAL_CAPSULE | ORAL | 2 refills | Status: DC
  Filled 2021-01-07: qty 90, 90d supply, fill #0
  Filled 2021-04-09: qty 90, 90d supply, fill #1

## 2021-01-13 ENCOUNTER — Other Ambulatory Visit (HOSPITAL_COMMUNITY): Payer: Self-pay

## 2021-01-13 DIAGNOSIS — Z23 Encounter for immunization: Secondary | ICD-10-CM | POA: Diagnosis not present

## 2021-01-13 DIAGNOSIS — E1165 Type 2 diabetes mellitus with hyperglycemia: Secondary | ICD-10-CM | POA: Diagnosis not present

## 2021-01-13 MED ORDER — DEXCOM G6 TRANSMITTER MISC
1 refills | Status: DC
  Filled 2021-01-13 – 2021-01-30 (×2): qty 1, 90d supply, fill #0
  Filled 2021-10-13: qty 1, 90d supply, fill #1

## 2021-01-13 MED ORDER — DEXCOM G6 SENSOR MISC
6 refills | Status: DC
  Filled 2021-01-13 – 2021-01-30 (×2): qty 3, 30d supply, fill #0
  Filled 2021-04-15: qty 3, 30d supply, fill #1
  Filled 2021-08-05: qty 3, 30d supply, fill #2
  Filled 2021-09-04: qty 3, 30d supply, fill #3
  Filled 2021-09-28: qty 3, 30d supply, fill #4
  Filled 2021-10-23: qty 3, 30d supply, fill #5
  Filled 2021-11-24: qty 3, 30d supply, fill #6

## 2021-01-13 MED ORDER — DEXCOM G6 RECEIVER DEVI
1 refills | Status: DC
  Filled 2021-01-13: qty 1, 90d supply, fill #0
  Filled 2021-07-02: qty 1, 1d supply, fill #0
  Filled 2021-10-13: qty 1, 1d supply, fill #1

## 2021-01-30 ENCOUNTER — Other Ambulatory Visit (HOSPITAL_COMMUNITY): Payer: Self-pay

## 2021-02-02 ENCOUNTER — Other Ambulatory Visit (HOSPITAL_COMMUNITY): Payer: Self-pay

## 2021-02-03 ENCOUNTER — Other Ambulatory Visit (HOSPITAL_COMMUNITY): Payer: Self-pay

## 2021-02-03 MED FILL — Insulin Pen Needle 32 G X 4 MM (1/6" or 5/32"): 28 days supply | Qty: 200 | Fill #3 | Status: AC

## 2021-02-03 MED FILL — Dapagliflozin Prop-Metformin HCl Tab ER 24HR 10-500 MG: ORAL | 30 days supply | Qty: 30 | Fill #6 | Status: AC

## 2021-02-03 MED FILL — Insulin Lispro Soln Pen-injector 100 Unit/ML (1 Unit Dial): SUBCUTANEOUS | 25 days supply | Qty: 15 | Fill #7 | Status: AC

## 2021-02-04 ENCOUNTER — Other Ambulatory Visit (HOSPITAL_COMMUNITY): Payer: Self-pay

## 2021-03-05 ENCOUNTER — Other Ambulatory Visit (HOSPITAL_COMMUNITY): Payer: Self-pay

## 2021-03-05 MED ORDER — TRESIBA FLEXTOUCH 100 UNIT/ML ~~LOC~~ SOPN: 70.0000 [IU] | PEN_INJECTOR | Freq: Every day | SUBCUTANEOUS | 4 refills | Status: DC

## 2021-03-05 MED ORDER — INSULIN LISPRO (1 UNIT DIAL) 100 UNIT/ML (KWIKPEN)
30.0000 [IU] | PEN_INJECTOR | Freq: Three times a day (TID) | SUBCUTANEOUS | 4 refills | Status: DC
  Filled 2021-03-05: qty 27, 30d supply, fill #0
  Filled 2021-04-15: qty 27, 30d supply, fill #1
  Filled 2021-06-08: qty 27, 30d supply, fill #2

## 2021-03-05 MED FILL — Losartan Potassium Tab 25 MG: ORAL | 90 days supply | Qty: 90 | Fill #2 | Status: AC

## 2021-03-06 ENCOUNTER — Other Ambulatory Visit (HOSPITAL_COMMUNITY): Payer: Self-pay

## 2021-03-06 MED ORDER — XIGDUO XR 10-500 MG PO TB24
1.0000 | ORAL_TABLET | Freq: Every day | ORAL | 6 refills | Status: DC
  Filled 2021-03-06: qty 30, 30d supply, fill #0
  Filled 2021-03-06: qty 90, 90d supply, fill #0
  Filled 2021-04-09: qty 30, 30d supply, fill #1
  Filled 2021-05-12: qty 30, 30d supply, fill #2
  Filled 2021-06-15: qty 30, 30d supply, fill #3
  Filled 2021-07-21: qty 30, 30d supply, fill #4
  Filled 2021-08-19: qty 30, 30d supply, fill #5
  Filled 2021-09-15: qty 30, 30d supply, fill #6
  Filled 2021-10-13: qty 30, 30d supply, fill #7

## 2021-03-06 MED FILL — Insulin Pen Needle 32 G X 4 MM (1/6" or 5/32"): 28 days supply | Qty: 200 | Fill #4 | Status: AC

## 2021-03-24 DIAGNOSIS — Z760 Encounter for issue of repeat prescription: Secondary | ICD-10-CM | POA: Diagnosis not present

## 2021-04-09 ENCOUNTER — Other Ambulatory Visit (HOSPITAL_BASED_OUTPATIENT_CLINIC_OR_DEPARTMENT_OTHER): Payer: Self-pay

## 2021-04-09 ENCOUNTER — Other Ambulatory Visit (HOSPITAL_COMMUNITY): Payer: Self-pay

## 2021-04-15 ENCOUNTER — Other Ambulatory Visit (HOSPITAL_COMMUNITY): Payer: Self-pay

## 2021-04-15 MED FILL — Insulin Pen Needle 32 G X 4 MM (1/6" or 5/32"): 28 days supply | Qty: 200 | Fill #5 | Status: AC

## 2021-04-22 ENCOUNTER — Other Ambulatory Visit (HOSPITAL_COMMUNITY): Payer: Self-pay

## 2021-04-22 MED ORDER — AMOXICILLIN 500 MG PO CAPS
2000.0000 mg | ORAL_CAPSULE | ORAL | 1 refills | Status: DC
  Filled 2021-04-22: qty 20, 5d supply, fill #0

## 2021-05-12 ENCOUNTER — Other Ambulatory Visit (HOSPITAL_COMMUNITY): Payer: Self-pay

## 2021-05-14 ENCOUNTER — Other Ambulatory Visit (HOSPITAL_COMMUNITY): Payer: Self-pay

## 2021-06-04 ENCOUNTER — Other Ambulatory Visit (HOSPITAL_COMMUNITY): Payer: Self-pay

## 2021-06-04 MED FILL — Losartan Potassium Tab 25 MG: ORAL | 60 days supply | Qty: 60 | Fill #3 | Status: AC

## 2021-06-05 ENCOUNTER — Other Ambulatory Visit (HOSPITAL_COMMUNITY): Payer: Self-pay

## 2021-06-08 ENCOUNTER — Other Ambulatory Visit (HOSPITAL_COMMUNITY): Payer: Self-pay

## 2021-06-15 ENCOUNTER — Other Ambulatory Visit (HOSPITAL_COMMUNITY): Payer: Self-pay

## 2021-06-16 ENCOUNTER — Other Ambulatory Visit (HOSPITAL_COMMUNITY): Payer: Self-pay

## 2021-06-16 DIAGNOSIS — E785 Hyperlipidemia, unspecified: Secondary | ICD-10-CM | POA: Diagnosis not present

## 2021-06-16 DIAGNOSIS — E1165 Type 2 diabetes mellitus with hyperglycemia: Secondary | ICD-10-CM | POA: Diagnosis not present

## 2021-06-16 MED ORDER — INSULIN PEN NEEDLE 32G X 4 MM MISC
10 refills | Status: AC
  Filled 2021-06-16 – 2021-09-15 (×2): qty 200, 28d supply, fill #0
  Filled 2021-10-13: qty 200, 28d supply, fill #1
  Filled 2021-11-11: qty 200, 28d supply, fill #2
  Filled 2021-12-16: qty 200, 28d supply, fill #3
  Filled 2022-02-02: qty 200, 28d supply, fill #4
  Filled 2022-02-22 – 2022-02-24 (×2): qty 200, 28d supply, fill #5
  Filled 2022-03-30: qty 200, 28d supply, fill #6

## 2021-06-16 MED ORDER — MOUNJARO 2.5 MG/0.5ML ~~LOC~~ SOAJ
2.5000 mg | SUBCUTANEOUS | 6 refills | Status: DC
  Filled 2021-06-16: qty 2, 28d supply, fill #0

## 2021-07-02 ENCOUNTER — Other Ambulatory Visit (HOSPITAL_COMMUNITY): Payer: Self-pay

## 2021-07-02 MED ORDER — DEXCOM G7 SENSOR MISC
6 refills | Status: DC
  Filled 2021-07-02 – 2022-03-29 (×4): qty 3, 30d supply, fill #0
  Filled 2022-04-15 – 2022-04-22 (×2): qty 3, 30d supply, fill #1
  Filled 2022-05-31: qty 3, 30d supply, fill #2

## 2021-07-02 MED ORDER — DEXCOM G7 RECEIVER DEVI
0 refills | Status: AC
  Filled 2021-07-02: qty 1, 1d supply, fill #0
  Filled 2021-10-13 – 2022-03-29 (×2): qty 1, 30d supply, fill #0

## 2021-07-14 ENCOUNTER — Emergency Department (HOSPITAL_COMMUNITY): Payer: 59

## 2021-07-14 ENCOUNTER — Encounter (HOSPITAL_COMMUNITY): Payer: Self-pay | Admitting: Emergency Medicine

## 2021-07-14 ENCOUNTER — Other Ambulatory Visit: Payer: Self-pay

## 2021-07-14 ENCOUNTER — Inpatient Hospital Stay (HOSPITAL_COMMUNITY)
Admission: EM | Admit: 2021-07-14 | Discharge: 2021-07-15 | DRG: 638 | Disposition: A | Discharge status: Home / Self Care | Payer: 59 | Attending: Pulmonary Disease | Admitting: Pulmonary Disease

## 2021-07-14 ENCOUNTER — Inpatient Hospital Stay (HOSPITAL_COMMUNITY): Payer: 59

## 2021-07-14 DIAGNOSIS — R739 Hyperglycemia, unspecified: Secondary | ICD-10-CM | POA: Diagnosis not present

## 2021-07-14 DIAGNOSIS — R7989 Other specified abnormal findings of blood chemistry: Secondary | ICD-10-CM | POA: Diagnosis present

## 2021-07-14 DIAGNOSIS — Z91018 Allergy to other foods: Secondary | ICD-10-CM | POA: Diagnosis not present

## 2021-07-14 DIAGNOSIS — R0602 Shortness of breath: Secondary | ICD-10-CM | POA: Diagnosis not present

## 2021-07-14 DIAGNOSIS — N179 Acute kidney failure, unspecified: Secondary | ICD-10-CM | POA: Diagnosis not present

## 2021-07-14 DIAGNOSIS — Z881 Allergy status to other antibiotic agents status: Secondary | ICD-10-CM | POA: Diagnosis not present

## 2021-07-14 DIAGNOSIS — Z8249 Family history of ischemic heart disease and other diseases of the circulatory system: Secondary | ICD-10-CM | POA: Diagnosis not present

## 2021-07-14 DIAGNOSIS — Z8371 Family history of colonic polyps: Secondary | ICD-10-CM | POA: Diagnosis not present

## 2021-07-14 DIAGNOSIS — S060XAA Concussion with loss of consciousness status unknown, initial encounter: Secondary | ICD-10-CM | POA: Diagnosis present

## 2021-07-14 DIAGNOSIS — E1110 Type 2 diabetes mellitus with ketoacidosis without coma: Secondary | ICD-10-CM | POA: Diagnosis not present

## 2021-07-14 DIAGNOSIS — E118 Type 2 diabetes mellitus with unspecified complications: Secondary | ICD-10-CM | POA: Diagnosis not present

## 2021-07-14 DIAGNOSIS — Z20822 Contact with and (suspected) exposure to covid-19: Secondary | ICD-10-CM | POA: Diagnosis not present

## 2021-07-14 DIAGNOSIS — G9340 Encephalopathy, unspecified: Secondary | ICD-10-CM | POA: Diagnosis not present

## 2021-07-14 DIAGNOSIS — D72829 Elevated white blood cell count, unspecified: Secondary | ICD-10-CM | POA: Diagnosis present

## 2021-07-14 DIAGNOSIS — Z7984 Long term (current) use of oral hypoglycemic drugs: Secondary | ICD-10-CM | POA: Diagnosis not present

## 2021-07-14 DIAGNOSIS — E785 Hyperlipidemia, unspecified: Secondary | ICD-10-CM | POA: Diagnosis present

## 2021-07-14 DIAGNOSIS — R0689 Other abnormalities of breathing: Secondary | ICD-10-CM | POA: Diagnosis not present

## 2021-07-14 DIAGNOSIS — J302 Other seasonal allergic rhinitis: Secondary | ICD-10-CM | POA: Diagnosis present

## 2021-07-14 DIAGNOSIS — K5792 Diverticulitis of intestine, part unspecified, without perforation or abscess without bleeding: Secondary | ICD-10-CM | POA: Diagnosis not present

## 2021-07-14 DIAGNOSIS — R000 Tachycardia, unspecified: Secondary | ICD-10-CM | POA: Diagnosis not present

## 2021-07-14 DIAGNOSIS — Z794 Long term (current) use of insulin: Secondary | ICD-10-CM | POA: Diagnosis not present

## 2021-07-14 DIAGNOSIS — I10 Essential (primary) hypertension: Secondary | ICD-10-CM | POA: Diagnosis present

## 2021-07-14 DIAGNOSIS — I700 Atherosclerosis of aorta: Secondary | ICD-10-CM | POA: Diagnosis not present

## 2021-07-14 DIAGNOSIS — Z79899 Other long term (current) drug therapy: Secondary | ICD-10-CM | POA: Diagnosis not present

## 2021-07-14 DIAGNOSIS — K760 Fatty (change of) liver, not elsewhere classified: Secondary | ICD-10-CM | POA: Diagnosis not present

## 2021-07-14 DIAGNOSIS — R110 Nausea: Secondary | ICD-10-CM | POA: Diagnosis not present

## 2021-07-14 DIAGNOSIS — E872 Acidosis, unspecified: Secondary | ICD-10-CM | POA: Insufficient documentation

## 2021-07-14 DIAGNOSIS — R0600 Dyspnea, unspecified: Secondary | ICD-10-CM | POA: Diagnosis not present

## 2021-07-14 DIAGNOSIS — I2510 Atherosclerotic heart disease of native coronary artery without angina pectoris: Secondary | ICD-10-CM | POA: Diagnosis present

## 2021-07-14 LAB — I-STAT VENOUS BLOOD GAS, ED
Acid-base deficit: 28 mmol/L — ABNORMAL HIGH (ref 0.0–2.0)
Bicarbonate: 4.6 mmol/L — ABNORMAL LOW (ref 20.0–28.0)
Calcium, Ion: 1.21 mmol/L (ref 1.15–1.40)
HCT: 51 % (ref 39.0–52.0)
Hemoglobin: 17.3 g/dL — ABNORMAL HIGH (ref 13.0–17.0)
O2 Saturation: 59 %
Patient temperature: 37
Potassium: 5.2 mmol/L — ABNORMAL HIGH (ref 3.5–5.1)
Sodium: 139 mmol/L (ref 135–145)
TCO2: 5 mmol/L — ABNORMAL LOW (ref 22–32)
pCO2, Ven: 24.4 mmHg — ABNORMAL LOW (ref 44–60)
pH, Ven: 6.882 — CL (ref 7.25–7.43)
pO2, Ven: 52 mmHg — ABNORMAL HIGH (ref 32–45)

## 2021-07-14 LAB — BASIC METABOLIC PANEL
Anion gap: 20 — ABNORMAL HIGH (ref 5–15)
Anion gap: 13 (ref 5–15)
Anion gap: 13 (ref 5–15)
BUN: 27 mg/dL — ABNORMAL HIGH (ref 6–20)
BUN: 27 mg/dL — ABNORMAL HIGH (ref 6–20)
BUN: 23 mg/dL — ABNORMAL HIGH (ref 6–20)
BUN: 20 mg/dL (ref 6–20)
CO2: <7 mmol/L — ABNORMAL LOW (ref 22–32)
CO2: 7 mmol/L — ABNORMAL LOW (ref 22–32)
CO2: 14 mmol/L — ABNORMAL LOW (ref 22–32)
CO2: 15 mmol/L — ABNORMAL LOW (ref 22–32)
Calcium: 8.3 mg/dL — ABNORMAL LOW (ref 8.9–10.3)
Calcium: 8.9 mg/dL (ref 8.9–10.3)
Calcium: 8.6 mg/dL — ABNORMAL LOW (ref 8.9–10.3)
Calcium: 8.6 mg/dL — ABNORMAL LOW (ref 8.9–10.3)
Chloride: 107 mmol/L (ref 98–111)
Chloride: 113 mmol/L — ABNORMAL HIGH (ref 98–111)
Chloride: 111 mmol/L (ref 98–111)
Chloride: 110 mmol/L (ref 98–111)
Creatinine, Ser: 2.07 mg/dL — ABNORMAL HIGH (ref 0.61–1.24)
Creatinine, Ser: 1.79 mg/dL — ABNORMAL HIGH (ref 0.61–1.24)
Creatinine, Ser: 1.42 mg/dL — ABNORMAL HIGH (ref 0.61–1.24)
Creatinine, Ser: 1.26 mg/dL — ABNORMAL HIGH (ref 0.61–1.24)
GFR, Estimated: 37 mL/min — ABNORMAL LOW (ref >60)
GFR, Estimated: 44 mL/min — ABNORMAL LOW (ref >60)
GFR, Estimated: 58 mL/min — ABNORMAL LOW (ref >60)
GFR, Estimated: >60 mL/min (ref >60)
Glucose, Bld: 459 mg/dL — ABNORMAL HIGH (ref 70–99)
Glucose, Bld: 269 mg/dL — ABNORMAL HIGH (ref 70–99)
Glucose, Bld: 170 mg/dL — ABNORMAL HIGH (ref 70–99)
Glucose, Bld: 158 mg/dL — ABNORMAL HIGH (ref 70–99)
Potassium: 5.6 mmol/L — ABNORMAL HIGH (ref 3.5–5.1)
Potassium: 4.6 mmol/L (ref 3.5–5.1)
Potassium: 4.2 mmol/L (ref 3.5–5.1)
Potassium: 3.5 mmol/L (ref 3.5–5.1)
Sodium: 137 mmol/L (ref 135–145)
Sodium: 140 mmol/L (ref 135–145)
Sodium: 138 mmol/L (ref 135–145)
Sodium: 138 mmol/L (ref 135–145)

## 2021-07-14 LAB — COMPREHENSIVE METABOLIC PANEL
ALT: 25 U/L (ref 0–44)
AST: 22 U/L (ref 15–41)
Albumin: 4.7 g/dL (ref 3.5–5.0)
Alkaline Phosphatase: 63 U/L (ref 38–126)
BUN: 24 mg/dL — ABNORMAL HIGH (ref 6–20)
CO2: <7 mmol/L — ABNORMAL LOW (ref 22–32)
Calcium: 9.4 mg/dL (ref 8.9–10.3)
Chloride: 107 mmol/L (ref 98–111)
Creatinine, Ser: 2.13 mg/dL — ABNORMAL HIGH (ref 0.61–1.24)
GFR, Estimated: 36 mL/min — ABNORMAL LOW (ref >60)
Glucose, Bld: 423 mg/dL — ABNORMAL HIGH (ref 70–99)
Potassium: 5.3 mmol/L — ABNORMAL HIGH (ref 3.5–5.1)
Sodium: 139 mmol/L (ref 135–145)
Total Bilirubin: 1.3 mg/dL — ABNORMAL HIGH (ref 0.3–1.2)
Total Protein: 8.7 g/dL — ABNORMAL HIGH (ref 6.5–8.1)

## 2021-07-14 LAB — URINALYSIS, ROUTINE W REFLEX MICROSCOPIC
Bacteria, UA: NONE SEEN
Bilirubin Urine: NEGATIVE
Glucose, UA: >=500 mg/dL — AB
Ketones, ur: 80 mg/dL — AB
Leukocytes,Ua: NEGATIVE
Nitrite: NEGATIVE
Protein, ur: 100 mg/dL — AB
Specific Gravity, Urine: 1.018 (ref 1.005–1.030)
pH: 5 (ref 5.0–8.0)

## 2021-07-14 LAB — TROPONIN I (HIGH SENSITIVITY)
Troponin I (High Sensitivity): 8 ng/L (ref <18)
Troponin I (High Sensitivity): 10 ng/L (ref <18)

## 2021-07-14 LAB — BLOOD GAS, ARTERIAL
Acid-base deficit: 28 mmol/L — ABNORMAL HIGH (ref 0.0–2.0)
Bicarbonate: 2.2 mmol/L — ABNORMAL LOW (ref 20.0–28.0)
Drawn by: 252031
O2 Saturation: 98.8 %
Patient temperature: 37
pCO2 arterial: <18 mmHg — CL (ref 32–48)
pH, Arterial: 6.96 — CL (ref 7.35–7.45)
pO2, Arterial: 125 mmHg — ABNORMAL HIGH (ref 83–108)

## 2021-07-14 LAB — GLUCOSE, CAPILLARY
Glucose-Capillary: 101 mg/dL — ABNORMAL HIGH (ref 70–99)
Glucose-Capillary: 156 mg/dL — ABNORMAL HIGH (ref 70–99)
Glucose-Capillary: 156 mg/dL — ABNORMAL HIGH (ref 70–99)
Glucose-Capillary: 156 mg/dL — ABNORMAL HIGH (ref 70–99)
Glucose-Capillary: 171 mg/dL — ABNORMAL HIGH (ref 70–99)
Glucose-Capillary: 191 mg/dL — ABNORMAL HIGH (ref 70–99)
Glucose-Capillary: 192 mg/dL — ABNORMAL HIGH (ref 70–99)
Glucose-Capillary: 196 mg/dL — ABNORMAL HIGH (ref 70–99)
Glucose-Capillary: 213 mg/dL — ABNORMAL HIGH (ref 70–99)
Glucose-Capillary: 217 mg/dL — ABNORMAL HIGH (ref 70–99)
Glucose-Capillary: 246 mg/dL — ABNORMAL HIGH (ref 70–99)
Glucose-Capillary: 254 mg/dL — ABNORMAL HIGH (ref 70–99)
Glucose-Capillary: 314 mg/dL — ABNORMAL HIGH (ref 70–99)
Glucose-Capillary: 383 mg/dL — ABNORMAL HIGH (ref 70–99)
Glucose-Capillary: 395 mg/dL — ABNORMAL HIGH (ref 70–99)

## 2021-07-14 LAB — CBC WITH DIFFERENTIAL/PLATELET
Abs Immature Granulocytes: 0.96 10*3/uL — ABNORMAL HIGH (ref 0.00–0.07)
Basophils Absolute: 0.1 10*3/uL (ref 0.0–0.1)
Basophils Relative: 1 %
Eosinophils Absolute: 0 10*3/uL (ref 0.0–0.5)
Eosinophils Relative: 0 %
HCT: 52.2 % — ABNORMAL HIGH (ref 39.0–52.0)
Hemoglobin: 15 g/dL (ref 13.0–17.0)
Immature Granulocytes: 3 %
Lymphocytes Relative: 7 %
Lymphs Abs: 2.1 10*3/uL (ref 0.7–4.0)
MCH: 24.8 pg — ABNORMAL LOW (ref 26.0–34.0)
MCHC: 28.7 g/dL — ABNORMAL LOW (ref 30.0–36.0)
MCV: 86.4 fL (ref 80.0–100.0)
Monocytes Absolute: 2 10*3/uL — ABNORMAL HIGH (ref 0.1–1.0)
Monocytes Relative: 6 %
Neutro Abs: 25.8 10*3/uL — ABNORMAL HIGH (ref 1.7–7.7)
Neutrophils Relative %: 83 %
Platelets: 653 10*3/uL — ABNORMAL HIGH (ref 150–400)
RBC: 6.04 MIL/uL — ABNORMAL HIGH (ref 4.22–5.81)
RDW: 14.7 % (ref 11.5–15.5)
Smear Review: INCREASED
WBC: 31 10*3/uL — ABNORMAL HIGH (ref 4.0–10.5)
nRBC: 0 % (ref 0.0–0.2)

## 2021-07-14 LAB — BETA-HYDROXYBUTYRIC ACID
Beta-Hydroxybutyric Acid: >8 mmol/L — ABNORMAL HIGH (ref 0.05–0.27)
Beta-Hydroxybutyric Acid: 7.79 mmol/L — ABNORMAL HIGH (ref 0.05–0.27)
Beta-Hydroxybutyric Acid: 3.38 mmol/L — ABNORMAL HIGH (ref 0.05–0.27)

## 2021-07-14 LAB — RESP PANEL BY RT-PCR (FLU A&B, COVID) ARPGX2
Influenza A by PCR: NEGATIVE
Influenza B by PCR: NEGATIVE
SARS Coronavirus 2 by RT PCR: NEGATIVE

## 2021-07-14 LAB — CBG MONITORING, ED
Glucose-Capillary: 453 mg/dL — ABNORMAL HIGH (ref 70–99)
Glucose-Capillary: 466 mg/dL — ABNORMAL HIGH (ref 70–99)
Glucose-Capillary: 422 mg/dL — ABNORMAL HIGH (ref 70–99)

## 2021-07-14 LAB — MRSA NEXT GEN BY PCR, NASAL: MRSA by PCR Next Gen: NOT DETECTED

## 2021-07-14 LAB — LACTIC ACID, PLASMA
Lactic Acid, Venous: 5.4 mmol/L (ref 0.5–1.9)
Lactic Acid, Venous: 3.7 mmol/L (ref 0.5–1.9)

## 2021-07-14 LAB — HEMOGLOBIN A1C
Hgb A1c MFr Bld: 7.5 % — ABNORMAL HIGH (ref 4.8–5.6)
Mean Plasma Glucose: 168.55 mg/dL

## 2021-07-14 LAB — HIV ANTIBODY (ROUTINE TESTING W REFLEX): HIV Screen 4th Generation wRfx: NONREACTIVE

## 2021-07-14 LAB — LIPASE, BLOOD: Lipase: 35 U/L (ref 11–51)

## 2021-07-14 LAB — PROCALCITONIN: Procalcitonin: 0.17 ng/mL

## 2021-07-14 MED ORDER — HEPARIN SODIUM (PORCINE) 5000 UNIT/ML IJ SOLN
5000.0000 [IU] | Freq: Three times a day (TID) | INTRAMUSCULAR | Status: DC
  Administered 2021-07-14 – 2021-07-15 (×3): 5000 [IU] via SUBCUTANEOUS
  Filled 2021-07-14 (×3): qty 1

## 2021-07-14 MED ORDER — CHLORHEXIDINE GLUCONATE CLOTH 2 % EX PADS
6.0000 | MEDICATED_PAD | Freq: Every day | CUTANEOUS | Status: DC
  Administered 2021-07-14: 6 via TOPICAL

## 2021-07-14 MED ORDER — PIPERACILLIN-TAZOBACTAM 3.375 G IVPB: 3.3750 g | Freq: Three times a day (TID) | INTRAVENOUS | Status: DC

## 2021-07-14 MED ORDER — SODIUM CHLORIDE 0.9 % IV BOLUS
1000.0000 mL | INTRAVENOUS | Status: AC
  Administered 2021-07-14: 1000 mL via INTRAVENOUS

## 2021-07-14 MED ORDER — INSULIN REGULAR(HUMAN) IN NACL 100-0.9 UT/100ML-% IV SOLN
INTRAVENOUS | Status: DC
  Administered 2021-07-14: 6 [IU]/h via INTRAVENOUS
  Filled 2021-07-14: qty 100

## 2021-07-14 MED ORDER — ONDANSETRON HCL 4 MG/2ML IJ SOLN: 4.0000 mg | Freq: Once | INTRAMUSCULAR | Status: AC

## 2021-07-14 MED ORDER — DEXTROSE 50 % IV SOLN: 0.0000 mL | INTRAVENOUS | Status: DC | PRN

## 2021-07-14 MED ORDER — LACTATED RINGERS IV SOLN: INTRAVENOUS | Status: DC

## 2021-07-14 MED ORDER — INSULIN ASPART 100 UNIT/ML IJ SOLN
2.0000 [IU] | INTRAMUSCULAR | Status: DC
  Administered 2021-07-15: 2 [IU] via SUBCUTANEOUS
  Administered 2021-07-15 (×2): 4 [IU] via SUBCUTANEOUS
  Administered 2021-07-15: 2 [IU] via SUBCUTANEOUS

## 2021-07-14 MED ORDER — SODIUM CHLORIDE 0.9 % IV BOLUS
1000.0000 mL | Freq: Once | INTRAVENOUS | Status: AC
  Administered 2021-07-14: 1000 mL via INTRAVENOUS

## 2021-07-14 MED ORDER — DEXTROSE IN LACTATED RINGERS 5 % IV SOLN: INTRAVENOUS | Status: DC

## 2021-07-14 MED ORDER — ACETAMINOPHEN 325 MG PO TABS
650.0000 mg | ORAL_TABLET | ORAL | Status: DC | PRN
  Administered 2021-07-15: 650 mg via ORAL
  Filled 2021-07-14: qty 2

## 2021-07-14 MED ORDER — ONDANSETRON HCL 4 MG/2ML IJ SOLN
INTRAMUSCULAR | Status: AC
  Administered 2021-07-14: 4 mg via INTRAVENOUS
  Filled 2021-07-14: qty 2

## 2021-07-14 MED ORDER — VANCOMYCIN HCL 2000 MG/400ML IV SOLN
2000.0000 mg | Freq: Once | INTRAVENOUS | Status: AC
  Administered 2021-07-14: 2000 mg via INTRAVENOUS
  Filled 2021-07-14: qty 400

## 2021-07-14 MED ORDER — INSULIN DETEMIR 100 UNIT/ML ~~LOC~~ SOLN
5.0000 [IU] | Freq: Two times a day (BID) | SUBCUTANEOUS | Status: DC
  Administered 2021-07-14 – 2021-07-15 (×2): 5 [IU] via SUBCUTANEOUS
  Filled 2021-07-14 (×3): qty 0.05

## 2021-07-14 MED ORDER — SODIUM BICARBONATE 8.4 % IV SOLN
INTRAVENOUS | Status: DC
  Filled 2021-07-14 (×3): qty 1000

## 2021-07-14 MED ORDER — PIPERACILLIN-TAZOBACTAM 3.375 G IVPB 30 MIN
3.3750 g | Freq: Once | INTRAVENOUS | Status: AC
Start: 2021-07-14 — End: 2021-07-14
  Administered 2021-07-14: 3.375 g via INTRAVENOUS
  Filled 2021-07-14: qty 50

## 2021-07-14 MED ORDER — INSULIN REGULAR(HUMAN) IN NACL 100-0.9 UT/100ML-% IV SOLN
INTRAVENOUS | Status: DC
  Administered 2021-07-14: 7.5 [IU]/h via INTRAVENOUS

## 2021-07-14 MED ORDER — LABETALOL HCL 5 MG/ML IV SOLN: 10.0000 mg | INTRAVENOUS | Status: DC | PRN

## 2021-07-14 MED ORDER — VANCOMYCIN HCL 1250 MG/250ML IV SOLN
1250.0000 mg | INTRAVENOUS | Status: DC
Start: 2021-07-15 — End: 2021-07-14

## 2021-07-14 MED ORDER — PANTOPRAZOLE SODIUM 40 MG IV SOLR
40.0000 mg | INTRAVENOUS | Status: DC
  Administered 2021-07-14: 40 mg via INTRAVENOUS
  Filled 2021-07-14: qty 10

## 2021-07-14 MED ORDER — METOPROLOL TARTRATE 25 MG PO TABS: 12.5000 mg | ORAL_TABLET | Freq: Two times a day (BID) | ORAL | Status: DC

## 2021-07-14 MED ORDER — LACTATED RINGERS IV BOLUS
20.0000 mL/kg | Freq: Once | INTRAVENOUS | Status: AC
  Administered 2021-07-14: 2268 mL via INTRAVENOUS

## 2021-07-14 NOTE — Progress Notes (Signed)
eLink Physician-Brief Progress Note ?Patient Name: Stephen Arnold ?DOB: 11-20-1965 ?MRN: [id number] ? ? ?Date of Service ? 07/14/2021  ?HPI/Events of Note ? Patient is coming off Insulin gtt and needs Insulin transition orders, he is also asking for PRN Tylenol.  ?eICU Interventions ? Hyperglycemia Insulin gtt transition orders entered, PRN Tylenol ordered.  ? ? ? ?  ? ?Kerry Kass Randolf Sansoucie ?07/14/2021, 9:42 PM ?

## 2021-07-14 NOTE — Progress Notes (Signed)
Sepsis tracking by eLINK 

## 2021-07-14 NOTE — Progress Notes (Signed)
Inpatient Diabetes Program Recommendations ? ?AACE/ADA: New Consensus Statement on Inpatient Glycemic Control (2015) ? ?Target Ranges:  Prepandial:   less than 140 mg/dL ?     Peak postprandial:   less than 180 mg/dL (1-2 hours) ?     Critically ill patients:  140 - 180 mg/dL  ? ?Lab Results  ?Component Value Date  ? GLUCAP 196 (H) 07/14/2021  ? HGBA1C 7.5 (H) 07/14/2021  ? ? ?Review of Glycemic Control ? Latest Reference Range & Units 07/14/21 11:21 07/14/21 12:23 07/14/21 13:17  ?Glucose-Capillary 70 - 99 mg/dL 254 (H) 217 (H) 196 (H)  ?(H): Data is abnormally high ?Diabetes history: Type 2 DM ?Outpatient Diabetes medications: Tresiba 70 units QPM, Humalog 18-30 units TID, Xigduo 10-500 mg QD, Mounjaro 2.5 mg qwk ?Current orders for Inpatient glycemic control: IV insulin ? ?Inpatient Diabetes Program Recommendations:   ? ?Spoke with patient regarding outpatient diabetes management. Patient is followed by Dr Chalmers Cater, outpatient endocrinology with last appointment 05/2021. At that visit, patients reports starting [facility]. Appetite became suppressed and patient experienced persistent nausea limiting intake. Vomiting started yesterday. ?Reviewed patient's current A1c of 7.5%. Explained what a A1c is and what it measures. Also reviewed goal A1c with patient, importance of good glucose control @ home, and blood sugar goals. Reviewed patho of DM, GLPs and side effects, starvation ketosis, vascular changes and commorbidities.  ?Patient encouraged to follow up with Dr Chalmers Cater. Spoke with Stephen Pates, NP regarding plan for discontinuation at discharge of Mounjaro. Patient and provider in agreement. Patient to follow up and requesting clear liquids. Order provided.  ? ?Thanks, ?Stephen Curb, MSN, RNC-OB ?Diabetes Coordinator ?[phone] (8a-5p) ? ? ? ? ?

## 2021-07-14 NOTE — Progress Notes (Signed)
Pr PCCM NP ok for Pt to have ice chips and water but nothing else by mouth at this time. ?

## 2021-07-14 NOTE — ED Notes (Signed)
Pt RA sat 98%. Pt requesting [place], noted increase work of breathing at 36 RR. Pt placed on 2L [place] for comfort, RN notified.  ?

## 2021-07-14 NOTE — H&P (Addendum)
? ?NAME:  Stephen Arnold, MRN:  [id number], DOB:  [birth year], LOS: 0 ?ADMISSION DATE:  07/14/2021, CONSULTATION DATE:  07/14/21 ?REFERRING MD:  Horton CHIEF COMPLAINT:  Generalized weakness  ? ?History of Present Illness:  ?Stephen Arnold is a 56 y.o. male who has a PMH as below including DM.  He presented to [facility] ED 3/28 with weakness and dyspnea.  He was on a weekend golf trip to [place] and returned home 3/26. When he woke up that morning, he felt "off" but was able to drive home.  He continued to have generalized ill feeling but this significantly worsened AM 3/27 when he had worsening nausea and vomiting.  He stayed home from work and when wife returned later, he had worsening dyspnea and new confusion prompting ED visit later that night. ? ?In ED, he was found to have DKA with pH 6.9. CT of abd / pelv was ordered and is pending. ? ?He states his sugars have been well controlled recently and on 3/26 they were around 110 - 113. He denies any recent fevers/chills/sweats, chest pain, cough, myalgias, exposures to known sick contacts.  He was started on Mounjaro recently and this was only new medication.  He ws initially excited for this as his sugars had been running almost perfectly on it.  He just took his fourth weekly dose in the week prior to ED presentation. Per he and his wife, he has never had any similar episodes or hospital admits for the same before. ? ?Pertinent  Medical History:  ?has ELBOW PAIN, RIGHT; MEDIAL EPICONDYLITIS, LEFT; Left knee pain; Knee pain; Leg length difference, acquired; Chest pain; Hyperlipidemia; CAD (coronary artery disease); Essential hypertension; Type 2 diabetes mellitus with complication, with long-term current use of insulin ([place]); Incarcerated incisional hernia s/p lap repair w mesh 01/20/2018; and DKA (diabetic ketoacidosis) ([place]) on their problem list. ? ?Significant Hospital Events: ?Including procedures, antibiotic start and stop dates in addition to other pertinent  events   ?3/28 admit. ? ?Interim History / Subjective:  ?Able to speak in full sentences and provide history at bedside. Asking for fluids and ice chips. ?Feels like needs to urinate. ? ?Objective:  ?Blood pressure 126/69, pulse (!) 124, temperature 97.8 ?F (36.6 ?C), resp. rate (!) 23, height 6' (1.829 m), weight 113.4 kg, SpO2 100 %. ?   ?   ? ?Intake/Output Summary (Last 24 hours) at 07/14/2021 6073 ?Last data filed at 07/14/2021 0609 ?Gross per 24 hour  ?Intake 3368 ml  ?Output --  ?Net 3368 ml  ? ?Filed Weights  ? 07/14/21 0208  ?Weight: 113.4 kg  ? ? ?Examination: ?General: Adult male, ill but in NAD. ?Neuro: Somnolent but arouses to voice and answers all questions appropriately, MAE's.Marland Kitchen ?HEENT: [place]/AT. Sclerae anicteric. MM dry. ?Cardiovascular: Tachy, regular, no M/R/G.  ?Lungs: Respirations even and unlabored.  CTA bilaterally, No W/R/R. ?Abdomen: BS x 4, soft, NT/ND.  ?Musculoskeletal: No gross deformities, no edema.  ?Skin: Intact, warm, no rashes. ? ?Labs/imaging personally reviewed:  ?CT A/P 3/28 >  ? ?Assessment & Plan:  ? ?DKA - unclear etiology, no infectious symptoms. Has had vomiting but this has happened before after starting Mounjaro.  Only new medication was Mounjaro, ? Culprit. ?- Admit to ICU. ?- Fluids and insulin per protocol. ?- Additional 2L NS now. ?- Continue HCO3 gtt for now. ?- Hold Mounjaro, likely needs to d/c and refrain from resuming. ? ?AKI. ?- Continue fluids. ?- Follow BMP. ? ?Leukocytosis - ? Reactive vs concentrated. No  infectious hx but has had vomiting. Elevated lactate while not 735% certain not due to infection, felt to be most likely 2/2 DKA. ?- Continue empiric abx for now but low threshold to d/c. ?- Check PCT. ? ?Hx CAD, HTN, HLD. ?- Hold home meds until can safely take PO. ? ?Best practice (evaluated daily):  ?Diet/type: NPO ?DVT prophylaxis: prophylactic heparin  ?GI prophylaxis: N/A ?Lines: N/A ?Foley:  N/A ?Code Status:  full code ?Last date of multidisciplinary goals  of care discussion: None yet. ? ?Labs   ?CBC: ?Recent Labs  ?Lab 07/14/21 ?0254 07/14/21 ?3299  ?WBC 31.0*  --   ?NEUTROABS 25.8*  --   ?HGB 15.0 17.3*  ?HCT 52.2* 51.0  ?MCV 86.4  --   ?PLT 653*  --   ? ? ?Basic Metabolic Panel: ?Recent Labs  ?Lab 07/14/21 ?0254 07/14/21 ?2426  ?NA 139 139  ?K 5.3* 5.2*  ?CL 107  --   ?CO2 <7*  --   ?GLUCOSE 423*  --   ?BUN 24*  --   ?CREATININE 2.13*  --   ?CALCIUM 9.4  --   ? ?GFR: ?Estimated Creatinine Clearance: 50.3 mL/min (A) (by C-G formula based on SCr of 2.13 mg/dL (H)). ?Recent Labs  ?Lab 07/14/21 ?0254 07/14/21 ?0330  ?WBC 31.0*  --   ?LATICACIDVEN  --  5.4*  ? ? ?Liver Function Tests: ?Recent Labs  ?Lab 07/14/21 ?8341  ?AST 22  ?ALT 25  ?ALKPHOS 63  ?BILITOT 1.3*  ?PROT 8.7*  ?ALBUMIN 4.7  ? ?Recent Labs  ?Lab 07/14/21 ?9622  ?LIPASE 35  ? ?No results for input(s): AMMONIA in the last 168 hours. ? ?ABG ?   ?Component Value Date/Time  ? PHART 6.96 (LL) 07/14/2021 0320  ? PCO2ART <18 (LL) 07/14/2021 0320  ? PO2ART 125 (H) 07/14/2021 0320  ? HCO3 4.6 (L) 07/14/2021 0529  ? TCO2 5 (L) 07/14/2021 0529  ? ACIDBASEDEF 28.0 (H) 07/14/2021 0529  ? O2SAT 59 07/14/2021 0529  ?  ? ?Coagulation Profile: ?No results for input(s): INR, PROTIME in the last 168 hours. ? ?Cardiac Enzymes: ?No results for input(s): CKTOTAL, CKMB, CKMBINDEX, TROPONINI in the last 168 hours. ? ?HbA1C: ?Hgb A1c MFr Bld  ?Date/Time Value Ref Range Status  ?03/01/2016 04:00 PM 11.2 (H) 4.8 - 5.6 % Final  ?  Comment:  ?  (NOTE) ?        Pre-diabetes: 5.7 - 6.4 ?        Diabetes: >6.4 ?        Glycemic control for adults with diabetes: <7.0 ?  ? ? ?CBG: ?Recent Labs  ?Lab 07/14/21 ?2979 07/14/21 ?8921  ?GLUCAP [place] ?Review of Systems:   ?All negative; except for those that are bolded, which indicate positives. ? ?Constitutional: weight loss, weight gain, night sweats, fevers, chills, fatigue, weakness.  ?HEENT: headaches, sore throat, sneezing, nasal congestion, post nasal drip, difficulty swallowing,  tooth/dental problems, visual complaints, visual changes, ear aches. ?Neuro: difficulty with speech, weakness, numbness, ataxia. ?CV:  chest pain, orthopnea, PND, swelling in lower extremities, dizziness, palpitations, syncope.  ?Resp: cough, hemoptysis, dyspnea, wheezing. ?GI: heartburn, indigestion, abdominal pain, nausea, vomiting, diarrhea, constipation, change in bowel habits, loss of appetite, hematemesis, melena, hematochezia.  ?GU: dysuria, change in color of urine, urgency or frequency, flank pain, hematuria. ?MSK: joint pain or swelling, decreased range of motion. ?Psych: change in mood or affect, depression, anxiety, suicidal ideations, homicidal ideations. ?Skin: rash, itching, bruising. ? ? ?Past Medical History:  ?  He,  has a past medical history of Abdominal hernia, Allergy, Aortic atherosclerosis ([place]), Arthritis, CAD (coronary artery disease) (07/04/2017), Contact lens/glasses fitting, Diabetes mellitus without complication ([place]), Diverticulitis, Fatty liver, Head injury, closed, with concussion, History of blood transfusion (1980s), History of inguinal hernia, Hyperlipidemia (06/02/2016), Hypertension, Seasonal allergies, and Wears glasses.  ? ?Surgical History:  ? ?Past Surgical History:  ?Procedure Laterality Date  ? CARDIAC CATHETERIZATION N/A 05/18/2016  ? Procedure: Left Heart Cath and Coronary Angiography;  Surgeon: Peter M Martinique, MD;  Location: [place] [facility];  Service: Cardiovascular;  Laterality: N/A;  ? CARDIAC CATHETERIZATION N/A 05/18/2016  ? 4 stentsProcedure: Coronary Stent Intervention;  Surgeon: Peter M Martinique, MD;  Location: [place] [facility];  Service: Cardiovascular;  Laterality: N/A;  ? CORONARY ANGIOPLASTY    ? ELBOW ARTHROTOMY Right 2010  ? HAND SURGERY Right 1980s  ? laceration small finger  ? INGUINAL HERNIA REPAIR Bilateral 2009  ? INSERTION OF MESH N/A 01/20/2018  ? Procedure: INSERTION OF MESH;  Surgeon: Michael Boston, MD;  Location: [facility];  Service:  General;  Laterality: N/A;  ? KNEE ARTHROSCOPY Left 2011-2017  ? "4 total" (05/18/2016)  ? KNEE ARTHROSCOPY Right   ? KNEE ARTHROSCOPY Left 03/02/2016  ? Procedure: ARTHROSCOPY KNEE; PARTIAL MEDIAL [place]

## 2021-07-14 NOTE — Progress Notes (Signed)
Notified bedside nurse of need to draw repeat lactic acid. 

## 2021-07-14 NOTE — ED Provider Notes (Signed)
?[facility] ?Provider Note ? ? ?CSN: [id number] ?Arrival date & time: 07/14/21  0203 ? ?  ? ?History ? ?Chief Complaint  ?Patient presents with  ? Shortness of Breath  ? ? ?Stephen Arnold is a 56 y.o. male. ? ?HPI ? ?  ? ?This is a 56 year old male with a history of coronary artery disease, hypertension, hyperlipidemia, diabetes who presents with shortness of breath, nausea and vomiting.  Wife provides most of the history.  States that he just returned from a golf trip in [place].  States he was not feeling well yesterday.  When she left for work he stated that he felt short of breath but he was not breathing rapidly or any new distress.  When she got home she noted that he had increased breathing and respiratory rate.  He indicated he had been vomiting most of the day.  Wife indicates that recently he was started on [facility] for his diabetes.  He has been having some stomach issues which they have related to the initiation of this medication.  She denies any recent fevers, cough, illnesses. ? ?Patient provides little information ?Level 5 caveat for acuity of condition ? ?Home Medications ?Prior to Admission medications   ?Medication Sig Start Date End Date Taking? Authorizing Provider  ?amoxicillin (AMOXIL) 500 MG capsule Take 4 capsules (2,000 mg total) by mouth 1 to 2 hours prior to dental appointment 04/22/21     ?aspirin EC 81 MG EC tablet Take 1 tablet (81 mg total) by mouth daily. 05/19/16   Leanor Kail, PA  ?celecoxib (CELEBREX) 200 MG capsule Take 1 capsule by mouth once a daily with food as needed for pain 01/07/21     ?Continuous Blood Gluc Receiver (DEXCOM G6 RECEIVER) DEVI Use as directed 01/13/21     ?Continuous Blood Gluc Receiver (DEXCOM G7 RECEIVER) DEVI Use as directed 07/02/21     ?Continuous Blood Gluc Sensor (DEXCOM G6 SENSOR) MISC Use 1 sensor as directed every 10 days 01/13/21     ?Continuous Blood Gluc Sensor (DEXCOM G7 SENSOR) MISC Change every 10  days as directed 07/02/21     ?Continuous Blood Gluc Transmit (DEXCOM G6 TRANSMITTER) MISC Use as directed every 90 days 01/13/21     ?COVID-19 At Home Antigen Test [facility] COVID-19 HOME TEST) KIT Use as directed. 12/03/20   Jefm Bryant, RPH  ?Dapagliflozin-metFORMIN HCl ER (XIGDUO XR) 10-500 MG TB24 TAKE 1 TABLET BY MOUTH ONCE DAILY 03/06/21     ?Evolocumab (REPATHA SURECLICK) 378 MG/ML SOAJ Inject 140 mg (1 pen) into the skin every 14 (fourteen) days. 01/05/21   Fay Records, MD  ?furosemide (LASIX) 40 MG tablet TAKE 1 TABLET BY MOUTH ONCE DAILY AS NEEDED. 07/03/20 07/03/21  Jacelyn Pi, MD  ?furosemide (LASIX) 40 MG tablet TAKE 1 TABLET BY MOUTH ONCE DAILY AS NEEDED. 10/26/19 10/25/20  Jacelyn Pi, MD  ?HYDROcodone-acetaminophen (NORCO/VICODIN) 5-325 MG tablet Take 1 tablet by mouth at bedtime as needed. 12/05/19   [provider]  ?insulin degludec (TRESIBA FLEXTOUCH) 100 UNIT/ML FlexTouch Pen Inject 70 Units into the skin daily. 01/06/21     ?insulin degludec (TRESIBA FLEXTOUCH) 100 UNIT/ML FlexTouch Pen Inject 70 Units into the skin daily. 03/05/21     ?insulin glargine, 1 Unit Dial, (TOUJEO) 300 UNIT/ML Solostar Pen INJECT 65 UNITS INTO THE SKIN ONCE DAILY. 06/29/19 06/28/20  Deland Pretty, MD  ?insulin lispro (HUMALOG KWIKPEN) 100 UNIT/ML KwikPen Inject 18-30 Units into the skin 3 (three) times daily.  03/05/21   Jacelyn Pi, MD  ?insulin lispro (HUMALOG) 100 UNIT/ML injection Inject 18-25 Units into the skin 3 (three) times daily before meals. Per sliding scale    [provider]  ?insulin lispro (HUMALOG) 100 UNIT/ML KwikPen INJECT 18 TO 25 UNITS INTO THE SKIN THREE TIMES DAILY. USE 2 TO 5 UNITS FOR CORRECTION AS NEEDED. TOTAL DAILY DOSE UP TO 60 UNITS. 02/25/20 02/24/21  Deland Pretty, MD  ?Insulin Pen Needle 32G X 4 MM MISC use as directed daily with toujeo and humalog insulin (may use up to 7 needles per day) 06/16/21   Jacelyn Pi, MD  ?loratadine (CLARITIN) 10 MG tablet Take 10  mg by mouth daily as needed for allergies. daily    [provider]  ?losartan (COZAAR) 25 MG tablet TAKE 1 TABLET (25 MG TOTAL) BY MOUTH DAILY. 07/07/20 08/04/21  Fay Records, MD  ?metoprolol succinate (TOPROL-XL) 50 MG 24 hr tablet TAKE 1 TABLET BY MOUTH ONCE A DAY 02/27/20 02/26/21  Jacelyn Pi, MD  ?metoprolol succinate (TOPROL-XL) 50 MG 24 hr tablet Take 1 tablet by mouth once daily 12/03/20     ?tirzepatide [facility]) 2.5 MG/0.5ML Pen Inject 2.5 mg into the skin once a week. 06/16/21     ?XIGDUO XR 10-500 MG TB24 Take 1 tablet by mouth daily. 01/15/20   [provider]  ?   ? ?Allergies    ?Ciprofloxacin, Mushroom extract complex, and Lipitor [atorvastatin]   ? ?Review of Systems   ?Review of Systems  ?Unable to perform ROS: Acuity of condition  ? ?Physical Exam ?Updated Vital Signs ?BP 126/69   Pulse (!) 124   Temp 97.8 ?F (36.6 ?C)   Resp (!) 23   Ht 1.829 m (6')   Wt 113.4 kg   SpO2 100%   BMI 33.91 kg/m?  ?Physical Exam ?Vitals and nursing note reviewed.  ?Constitutional:   ?   Comments: Somnolent but arousable, confused  ?HENT:  ?   Head: Normocephalic and atraumatic.  ?   Mouth/Throat:  ?   Mouth: Mucous membranes are dry.  ?Eyes:  ?   Pupils: Pupils are equal, round, and reactive to light.  ?Cardiovascular:  ?   Rate and Rhythm: Regular rhythm. Tachycardia present.  ?   Heart sounds: Normal heart sounds. No murmur heard. ?Pulmonary:  ?   Effort: No respiratory distress.  ?   Breath sounds: Normal breath sounds. No wheezing.  ?   Comments: Tachypnea with increased respiratory rate without any distress ?Abdominal:  ?   General: Bowel sounds are normal.  ?   Palpations: Abdomen is soft.  ?   Tenderness: There is no abdominal tenderness. There is no rebound.  ?Musculoskeletal:  ?   Cervical back: Neck supple.  ?   Right lower leg: No edema.  ?   Left lower leg: No edema.  ?Lymphadenopathy:  ?   Cervical: No cervical adenopathy.  ?Skin: ?   General: Skin is warm and dry.   ?Neurological:  ?   Comments: Somnolent but arousable, oriented to himself  ?Psychiatric:     ?   Mood and Affect: Mood normal.  ? ? ?ED Results / Procedures / Treatments   ?Labs ?(all labs ordered are listed, but only abnormal results are displayed) ?Labs Reviewed  ?COMPREHENSIVE METABOLIC PANEL - Abnormal; Notable for the following components:  ?    Result Value  ? Potassium 5.3 (*)   ? CO2 <7 (*)   ? Glucose, Bld 423 (*)   ?  BUN 24 (*)   ? Creatinine, Ser 2.13 (*)   ? Total Protein 8.7 (*)   ? Total Bilirubin 1.3 (*)   ? GFR, Estimated 36 (*)   ? All other components within normal limits  ?CBC WITH DIFFERENTIAL/PLATELET - Abnormal; Notable for the following components:  ? WBC 31.0 (*)   ? RBC 6.04 (*)   ? HCT 52.2 (*)   ? MCH 24.8 (*)   ? MCHC 28.7 (*)   ? Platelets 653 (*)   ? Neutro Abs 25.8 (*)   ? Monocytes Absolute 2.0 (*)   ? Abs Immature Granulocytes 0.96 (*)   ? All other components within normal limits  ?URINALYSIS, ROUTINE W REFLEX MICROSCOPIC - Abnormal; Notable for the following components:  ? Color, Urine STRAW (*)   ? Glucose, UA >=500 (*)   ? Hgb urine dipstick MODERATE (*)   ? Ketones, ur 80 (*)   ? Protein, ur 100 (*)   ? All other components within normal limits  ?BETA-HYDROXYBUTYRIC ACID - Abnormal; Notable for the following components:  ? Beta-Hydroxybutyric Acid >8.00 (*)   ? All other components within normal limits  ?BLOOD GAS, ARTERIAL - Abnormal; Notable for the following components:  ? pH, Arterial 6.96 (*)   ? pCO2 arterial <18 (*)   ? pO2, Arterial 125 (*)   ? Bicarbonate 2.2 (*)   ? Acid-base deficit 28.0 (*)   ? All other components within normal limits  ?LACTIC ACID, PLASMA - Abnormal; Notable for the following components:  ? Lactic Acid, Venous 5.4 (*)   ? All other components within normal limits  ?I-STAT VENOUS BLOOD GAS, ED - Abnormal; Notable for the following components:  ? pH, Ven 6.882 (*)   ? pCO2, Ven 24.4 (*)   ? pO2, Ven 52 (*)   ? Bicarbonate 4.6 (*)   ? TCO2 5 (*)   ?  Acid-base deficit 28.0 (*)   ? Potassium 5.2 (*)   ? Hemoglobin 17.3 (*)   ? All other components within normal limits  ?CBG MONITORING, ED - Abnormal; Notable for the following components:  ? Glucose-Capillary 453 (

## 2021-07-14 NOTE — ED Triage Notes (Signed)
Pt arrive by EMS from home for increase SOB x 1.5 day. CBG 333, HR 130. ?

## 2021-07-14 NOTE — ED Provider Triage Note (Signed)
Emergency Medicine Provider Triage Evaluation Note ? ?Stephen Arnold , a 56 y.o. male  was evaluated in triage.  Pt complaints of dyspnea that began yesterday. Patient reports that he has felt persistently short of breath, hurts/difficult to take a deep breath, with associated nausea/vomiting. Has not been able to keep anything down. He feels generally weak and intermittently lightheaded.  ? ?Hx of CAD, hyperlipidemia, DM, hypertension  ? ?Review of Systems  ?Positive: Dyspnea, vomiting, nausea, weakness, lightheadedness ?Negative: Hematemesis, abdominal pain, hemoptysis, cough,  ? ?Physical Exam  ?BP (!) 160/87   Pulse (!) 138   Temp 97.8 ?F (36.6 ?C)   Resp 16   Ht 6' (1.829 m)   Wt 113.4 kg   SpO2 100%   BMI 33.91 kg/m?  ?Gen:   Awake ?Resp:  Tachypneic  ?Other:  Tachycardic  ? ?Medical Decision Making  ?Medically screening exam initiated at 2:22 AM.  Appropriate orders placed.  Stephen Arnold was informed that the remainder of the evaluation will be completed by another provider, this initial triage assessment does not replace that evaluation, and the importance of remaining in the ED until their evaluation is complete. ? ?Patient notably tachypneic and tachycardic.  ?Will remain in triage at this time, not waiting room appropriate. Labs & CXR ordered in addition to EKG. Will prioritize rooming.  ?  Amaryllis Dyke, PA-C ?07/14/21 4562 ? ?

## 2021-07-14 NOTE — Progress Notes (Signed)
?  Transition of Care (TOC) Screening Note ? ? ?Patient Details  ?Name: Stephen Arnold ?Date of Birth: [birth year] ? ? ?Transition of Care (TOC) CM/SW Contact:    ?Benard Halsted, LCSW ?Phone Number: ?07/14/2021, 10:07 AM ? ? ? ?Transition of Care Department [facility]) has reviewed patient and no TOC needs have been identified at this time. We will continue to monitor patient advancement through interdisciplinary progression rounds. If new patient transition needs arise, please place a TOC consult. ? ? ?

## 2021-07-14 NOTE — Progress Notes (Signed)
Pharmacy Antibiotic Note ? ?Stephen Arnold is a 56 y.o. male admitted on 07/14/2021 with sepsis.  Pharmacy has been consulted for Vancomycin/Zosyn dosing. Pt presents in DKA with severe acidosis. WBC elevated. Noted renal dysfunction. ? ?Plan: ?Vancomycin 2000 mg IV x 1, then 1250 mg IV q24h ?>>>Estimated AUC: 557 ?Zosyn 3.375G IV q8h to be infused over 4 hours ?Trend WBC, temp, renal function  ?F/U infectious work-up ?Drug levels as indicated ? ? ?Height: 6' (182.9 cm) ?Weight: 113.4 kg (250 lb) ?IBW/kg (Calculated) : 77.6 ? ?Temp (24hrs), Avg:97.8 ?F (36.6 ?C), Min:97.8 ?F (36.6 ?C), Max:97.8 ?F (36.6 ?C) ? ?Recent Labs  ?Lab 07/14/21 ?0254 07/14/21 ?0330  ?WBC 31.0*  --   ?CREATININE 2.13*  --   ?LATICACIDVEN  --  5.4*  ?  ?Estimated Creatinine Clearance: 50.3 mL/min (A) (by C-G formula based on SCr of 2.13 mg/dL (H)).   ? ?Allergies  ?Allergen Reactions  ? Ciprofloxacin Shortness Of Breath  ?  Severe tightness in chest  ? Mushroom Extract Complex Hives and Swelling  ?  "Mushrooms"  ? Lipitor [Atorvastatin]   ?  Muscle ache ?** ANY STATIN DRUGS  ? ? ?Narda Bonds, PharmD, BCPS ?Clinical Pharmacist ?Phone: [phone] ? ?

## 2021-07-14 NOTE — Consult Note (Signed)
? ?  [facility] CM Inpatient Consult ? ? ?07/14/2021 ? ?Emeline Gins Radabaugh ?1965/07/14 ?[id number] ? ? [place] Patient: [place] plan ? ?Primary Care Provider:  Deland Pretty, MD is with [facility], an independent Embedded provider with a Chronic Care Management team and program which is listed to provide the [facility] calls and appointments. ? ?Plan: Patient will continue to follow and refer as needed.  ? ?For additional questions or referrals please contact: ?  ?Natividad Brood, RN BSN CCM ?[facility] Liaison ? [phone] business mobile phone ?Toll free office [phone]  ?Fax number: [phone] ?[email or web address]  ? ?

## 2021-07-15 DIAGNOSIS — Z794 Long term (current) use of insulin: Secondary | ICD-10-CM

## 2021-07-15 DIAGNOSIS — E118 Type 2 diabetes mellitus with unspecified complications: Secondary | ICD-10-CM

## 2021-07-15 LAB — BASIC METABOLIC PANEL
Anion gap: 12 (ref 5–15)
BUN: 18 mg/dL (ref 6–20)
CO2: 17 mmol/L — ABNORMAL LOW (ref 22–32)
Calcium: 8.5 mg/dL — ABNORMAL LOW (ref 8.9–10.3)
Chloride: 110 mmol/L (ref 98–111)
Creatinine, Ser: 1.02 mg/dL (ref 0.61–1.24)
GFR, Estimated: >60 mL/min (ref >60)
Glucose, Bld: 100 mg/dL — ABNORMAL HIGH (ref 70–99)
Potassium: 3.5 mmol/L (ref 3.5–5.1)
Sodium: 139 mmol/L (ref 135–145)

## 2021-07-15 LAB — GLUCOSE, CAPILLARY
Glucose-Capillary: 139 mg/dL — ABNORMAL HIGH (ref 70–99)
Glucose-Capillary: 166 mg/dL — ABNORMAL HIGH (ref 70–99)
Glucose-Capillary: 151 mg/dL — ABNORMAL HIGH (ref 70–99)
Glucose-Capillary: 166 mg/dL — ABNORMAL HIGH (ref 70–99)
Glucose-Capillary: 148 mg/dL — ABNORMAL HIGH (ref 70–99)
Glucose-Capillary: 174 mg/dL — ABNORMAL HIGH (ref 70–99)

## 2021-07-15 LAB — MAGNESIUM: Magnesium: 2 mg/dL (ref 1.7–2.4)

## 2021-07-15 LAB — CBC
HCT: 35.7 % — ABNORMAL LOW (ref 39.0–52.0)
Hemoglobin: 11.5 g/dL — ABNORMAL LOW (ref 13.0–17.0)
MCH: 25.4 pg — ABNORMAL LOW (ref 26.0–34.0)
MCHC: 32.2 g/dL (ref 30.0–36.0)
MCV: 78.8 fL — ABNORMAL LOW (ref 80.0–100.0)
Platelets: 313 10*3/uL (ref 150–400)
RBC: 4.53 MIL/uL (ref 4.22–5.81)
RDW: 14.9 % (ref 11.5–15.5)
WBC: 15.4 10*3/uL — ABNORMAL HIGH (ref 4.0–10.5)
nRBC: 0 % (ref 0.0–0.2)

## 2021-07-15 LAB — PHOSPHORUS: Phosphorus: 2.1 mg/dL — ABNORMAL LOW (ref 2.5–4.6)

## 2021-07-15 MED ORDER — POTASSIUM CHLORIDE CRYS ER 20 MEQ PO TBCR
40.0000 meq | EXTENDED_RELEASE_TABLET | Freq: Once | ORAL | Status: DC
Start: 2021-07-15 — End: 2021-07-15

## 2021-07-15 MED ORDER — K PHOS MONO-SOD PHOS DI & MONO 155-852-130 MG PO TABS
500.0000 mg | ORAL_TABLET | Freq: Two times a day (BID) | ORAL | Status: DC
  Administered 2021-07-15: 500 mg via ORAL
  Filled 2021-07-15 (×2): qty 2

## 2021-07-15 MED ORDER — INSULIN LISPRO (1 UNIT DIAL) 100 UNIT/ML (KWIKPEN)
PEN_INJECTOR | SUBCUTANEOUS | 4 refills | Status: AC
Start: 2021-07-15 — End: ?

## 2021-07-15 MED ORDER — ASPIRIN 81 MG PO CHEW
81.0000 mg | CHEWABLE_TABLET | Freq: Every day | ORAL | Status: DC
  Administered 2021-07-15: 81 mg via ORAL
  Filled 2021-07-15: qty 1

## 2021-07-15 MED ORDER — METFORMIN HCL ER 500 MG PO TB24
500.0000 mg | ORAL_TABLET | Freq: Every day | ORAL | Status: DC
  Administered 2021-07-15: 500 mg via ORAL
  Filled 2021-07-15 (×2): qty 1

## 2021-07-15 MED ORDER — TRESIBA FLEXTOUCH 100 UNIT/ML ~~LOC~~ SOPN: 20.0000 [IU] | PEN_INJECTOR | Freq: Every day | SUBCUTANEOUS | 0 refills | Status: DC

## 2021-07-15 MED ORDER — PANTOPRAZOLE SODIUM 40 MG PO TBEC
40.0000 mg | DELAYED_RELEASE_TABLET | Freq: Every day | ORAL | Status: DC
  Administered 2021-07-15: 40 mg via ORAL
  Filled 2021-07-15: qty 1

## 2021-07-15 MED ORDER — POTASSIUM CHLORIDE CRYS ER 20 MEQ PO TBCR
40.0000 meq | EXTENDED_RELEASE_TABLET | Freq: Once | ORAL | Status: AC
  Administered 2021-07-15: 40 meq via ORAL
  Filled 2021-07-15: qty 2

## 2021-07-15 MED ORDER — LOSARTAN POTASSIUM 50 MG PO TABS
25.0000 mg | ORAL_TABLET | Freq: Every day | ORAL | Status: DC
  Administered 2021-07-15: 25 mg via ORAL
  Filled 2021-07-15: qty 1

## 2021-07-15 MED ORDER — DAPAGLIFLOZIN PROPANEDIOL 10 MG PO TABS
10.0000 mg | ORAL_TABLET | Freq: Every day | ORAL | Status: DC
  Administered 2021-07-15: 10 mg via ORAL
  Filled 2021-07-15: qty 1

## 2021-07-15 MED ORDER — LORATADINE 10 MG PO TABS
10.0000 mg | ORAL_TABLET | Freq: Every day | ORAL | Status: DC
  Administered 2021-07-15: 10 mg via ORAL
  Filled 2021-07-15: qty 1

## 2021-07-15 NOTE — Progress Notes (Signed)
D/C education given to Pt and Pt's wife, all questions answered. No printed prescriptions to give or equipment to deliver. IVs removed. Pt taken to car with all belongings.   ?

## 2021-07-15 NOTE — Progress Notes (Signed)
? ?NAME:  Stephen Arnold, MRN:  [id number], DOB:  04-26-1965, LOS: 1 ?ADMISSION DATE:  07/14/2021, CONSULTATION DATE:  07/14/21 ?REFERRING MD:  Horton CHIEF COMPLAINT:  Generalized weakness  ? ?History of Present Illness:  ?Stephen Arnold is a 56 y.o. male who has a PMH as below including DM.  He presented to [facility] ED 3/28 with weakness and dyspnea.  He was on a weekend golf trip to [facility] and returned home 3/26. When he woke up that morning, he felt "off" but was able to drive home.  He continued to have generalized ill feeling but this significantly worsened AM 3/27 when he had worsening nausea and vomiting.  He stayed home from work and when wife returned later, he had worsening dyspnea and new confusion prompting ED visit later that night. ? ?In ED, he was found to have DKA with pH 6.9. CT of abd / pelv was ordered and is pending. ? ?He states his sugars have been well controlled recently and on 3/26 they were around 110 - 113. He denies any recent fevers/chills/sweats, chest pain, cough, myalgias, exposures to known sick contacts.  He was started on Mounjaro recently and this was only new medication.  He ws initially excited for this as his sugars had been running almost perfectly on it.  He just took his fourth weekly dose in the week prior to ED presentation. Per he and his wife, he has never had any similar episodes or hospital admits for the same before. ? ?Pertinent  Medical History:  ?has ELBOW PAIN, RIGHT; MEDIAL EPICONDYLITIS, LEFT; Left knee pain; Knee pain; Leg length difference, acquired; Chest pain; Hyperlipidemia; CAD (coronary artery disease); Essential hypertension; Type 2 diabetes mellitus with complication, with long-term current use of insulin ([place]); Incarcerated incisional hernia s/p lap repair w mesh 01/20/2018; DKA (diabetic ketoacidosis) ([place]); Acute renal failure ([place]); Acute encephalopathy; and Metabolic acidosis on their problem list. ? ?Significant Hospital Events: ?Including  procedures, antibiotic start and stop dates in addition to other pertinent events   ?3/28 admit with DKA ? ?Interim History / Subjective:  ?CT abd/pelvis yesterday> no acute findings.  Empiric Abx stopped.  ? ?Transitioned off insulin gtt overnight ?No further complaints of N/V, reflux symptoms or really any complaints.  Feeling much better.  ?Only have clear liquids thus far ? ?Patient reports greatly decreasing his insulin regimen at home- both short and long acting since starting mounjaro ? ?Objective:  ?Blood pressure 127/61, pulse 79, temperature 98.7 ?F (37.1 ?C), temperature source Axillary, resp. rate 17, height 6' (1.829 m), weight 113.4 kg, SpO2 97 %. ?   ?   ? ?Intake/Output Summary (Last 24 hours) at 07/15/2021 1048 ?Last data filed at 07/15/2021 0730 ?Gross per 24 hour  ?Intake 3779.57 ml  ?Output 4150 ml  ?Net -370.43 ml  ? ?Filed Weights  ? 07/14/21 0208  ?Weight: 113.4 kg  ? ? ?Examination: ?General:  Pleasant adult male sitting upright in bed eating breakfast (clears) ?HEENT: MM pink/moist ?Neuro:  Aox3, MAE ?CV: rr, NSR, no murmur ?PULM:  non labored, CTA  ?GI: soft, bs+, NT/ ND, voids ?Extremities: warm/dry, no LE edema, dexcom to posterior LUE  ?Skin: no rashes ? ?Labs: K 3.5, phos 2.1, bicarb 17, AG closed/ 12, mag 2, sCr 1.02, WBC 31> 15.4 ? ?CBG trend over last 12 hrs > 101 to 171 ? ?Labs/imaging personally reviewed:  ?CT A/P 3/28 >  ? ?Assessment & Plan:  ? ?DKA - felt related to due to recent start  of mounjaro 4 weeks ago with rapid wean of both short and long acting insulin and decreased appetite/ intake > leading to starvation ketosis.  A1c 7.5 this admit.  ?- stop bicarb gtt and MIVF ?- continue SSI and levemir 5 units, for now, reassess after lunch.  Did not want anything more than clear liquids for breakfast. ?- advance diet to carb modified and reassess oral intake with CBG response.  He may be able to be discharged later today vs tomorrow depending on today's course, but would like to  resume him on his adjusted home meds and get him stable prior to d/c.  He takes Xigduo XR 10-'500mg'$  (dapagliflozin-metformin) PO daily, TID humalog SSI, and tresiba (but dropped from 70 units to 0-10 units daily since starting monjuaro).  Ideally, need to keep more long acting and minimize/wean SSI use.  He still will have mounjaro in his system untill Friday.  We will not continue this on discharge and have him followup closely with his endocrinologist, Dr. Chalmers Cater.   ?- appreciate diabetes coordinator input/ teaching  ? ?AKI, resolved  ?Hypophos ?- stop MIVF.  Trend renal indices as needed ?- K 3.5, phos 2.1> s/p kphos replete.  Mag 2 ? ? ?Leukocytosis, improving.   ?- Felt reactive given no other clinical symptoms other than N/V with negative CT a/p which can be related to above.  Abx stopped 3/28.  PCT reassuring.  Cultures neg to date ?- continue to clinically monitor  ? ?Hx CAD, HTN, HLD. ?- restart home ASA and losartan 25 mg daily.  Hold Toprol-XL 50 mg daily as SBP ranged 116- 144 overnight.  ? ? ?Seasonal allergies ?- resume home claritin ? ?Best practice (evaluated daily):  ?Diet/type: Regular consistency (see orders) ?DVT prophylaxis: prophylactic heparin  ?GI prophylaxis: N/A ?Lines: N/A ?Foley:  N/A ?Code Status:  full code ?Last date of multidisciplinary goals of care discussion: 3/28.   ? ?Patient updated on plan of care.  Will hold in ICU till after lunch- depending course may discharged from ICU, if not will transfer to floor and TRH.  ? ?Labs   ?CBC: ?Recent Labs  ?Lab 07/14/21 ?0254 07/14/21 ?8676 07/15/21 ?0045  ?WBC 31.0*  --  15.4*  ?NEUTROABS 25.8*  --   --   ?HGB 15.0 17.3* 11.5*  ?HCT 52.2* 51.0 35.7*  ?MCV 86.4  --  78.8*  ?PLT 653*  --  313  ? ? ?Basic Metabolic Panel: ?Recent Labs  ?Lab 07/14/21 ?0641 07/14/21 ?1036 07/14/21 ?1550 07/14/21 ?1945 07/15/21 ?0045  ?NA 137 140 138 138 139  ?K 5.6* 4.6 4.2 3.5 3.5  ?CL 107 113* 111 110 110  ?CO2 <7* 7* 14* 15* 17*  ?GLUCOSE 459* 269* 170* 158*  100*  ?BUN 27* 27* 23* 20 18  ?CREATININE 2.07* 1.79* 1.42* 1.26* 1.02  ?CALCIUM 8.3* 8.9 8.6* 8.6* 8.5*  ?MG  --   --   --   --  2.0  ?PHOS  --   --   --   --  2.1*  ? ?GFR: ?Estimated Creatinine Clearance: 105.1 mL/min (by C-G formula based on SCr of 1.02 mg/dL). ?Recent Labs  ?Lab 07/14/21 ?0254 07/14/21 ?0330 07/14/21 ?1950 07/15/21 ?0045  ?PROCALCITON  --   --  0.17  --   ?WBC 31.0*  --   --  15.4*  ?LATICACIDVEN  --  5.4* 3.7*  --   ? ? ?Liver Function Tests: ?Recent Labs  ?Lab 07/14/21 ?9326  ?AST 22  ?ALT 25  ?  ALKPHOS 63  ?BILITOT 1.3*  ?PROT 8.7*  ?ALBUMIN 4.7  ? ?Recent Labs  ?Lab 07/14/21 ?5643  ?LIPASE 35  ? ?No results for input(s): AMMONIA in the last 168 hours. ? ?ABG ?   ?Component Value Date/Time  ? PHART 6.96 (LL) 07/14/2021 0320  ? PCO2ART <18 (LL) 07/14/2021 0320  ? PO2ART 125 (H) 07/14/2021 0320  ? HCO3 4.6 (L) 07/14/2021 0529  ? TCO2 5 (L) 07/14/2021 0529  ? ACIDBASEDEF 28.0 (H) 07/14/2021 0529  ? O2SAT 59 07/14/2021 0529  ?  ? ?Coagulation Profile: ?No results for input(s): INR, PROTIME in the last 168 hours. ? ?Cardiac Enzymes: ?No results for input(s): CKTOTAL, CKMB, CKMBINDEX, TROPONINI in the last 168 hours. ? ?HbA1C: ?Hgb A1c MFr Bld  ?Date/Time Value Ref Range Status  ?07/14/2021 06:41 AM 7.5 (H) 4.8 - 5.6 % Final  ?  Comment:  ?  (NOTE) ?Pre diabetes:          5.7%-6.4% ? ?Diabetes:              >6.4% ? ?Glycemic control for   <7.0% ?adults with diabetes ?  ?03/01/2016 04:00 PM 11.2 (H) 4.8 - 5.6 % Final  ?  Comment:  ?  (NOTE) ?        Pre-diabetes: 5.7 - 6.4 ?        Diabetes: >6.4 ?        Glycemic control for adults with diabetes: <7.0 ?  ? ? ?CBG: ?Recent Labs  ?Lab 07/14/21 ?1918 07/14/21 ?2017 07/14/21 ?2342 07/15/21 ?0344 07/15/21 ?0809  ?GLUCAP 156* 213* 101* 139* 166*  ? ? ? ?Critical care time: n/a  ? ? ?Kennieth Rad, ACNP ?[facility] ?07/15/2021, 10:48 AM ? ?See Amion for pager ?If no response to pager, please call PCCM consult pager ?After 7:00 pm  call Elink   ? ? ? ? ?

## 2021-07-15 NOTE — Discharge Summary (Addendum)
Physician Discharge Summary  ? ?  ? ? ? ? ?Patient ID: ?Stephen Arnold ?MRN: [id number] ?DOB/AGE: 1965-08-12 56 y.o. ? ?Admit date: 07/14/2021 ?Discharge date: 07/15/2021 ? ?Discharge Diagnoses:   ? ?Active Hospital Problems  ? Diagnosis Date Noted  ? DKA (diabetic ketoacidosis) ([place]) 07/14/2021  ?  ?Resolved Hospital Problems  ?No resolved problems to display.  ?  ? ? ?Discharge summary   ? ?Stephen Arnold is a 56 y.o. male who has a prior medical history of IDDM DMT2, HTN, HLD, CAD, and hernia.  He presented to [facility] ED 3/28 with weakness and dyspnea.  Just returned home from a weekend golf trip to [facility] on 3/26. When he woke up that morning, he felt "off" but was able to drive home.  He continued to have generalized ill feeling but this significantly worsened AM 3/27 when he had worsening nausea and vomiting.  He stayed home from work and when wife returned later, he had worsening dyspnea and new confusion prompting ED visit later that night. ?  ?He reported his sugars have been well controlled recently and on 3/26 they were around 110 - 113. He denies any recent fevers/chills/sweats, chest pain, cough, myalgias, exposures to known sick contacts.  He was started on Mounjaro four weeks ago, still on initial dose, and this was only new medication.  He ws initially excited for this as his sugars had been running almost perfectly on it.  He just took his fourth weekly dose on 3/24.  Per he and his wife, he has never had any similar episodes or hospital admits for the same before.  Patient does admit to rapidly decreasing both his short and long acting insulin since starting Mounjaro and not being able to eat as much.  He does monitor his glucose using dexcom.  He is followed by Dr. Chalmers Cater endocrinologist.  ? ?In ED, he was found to have DKA with AKI with pH 6.9, undetectable bicarb, initial beta-hydroxybutyric acid >8, glucose 459, A1c 7.5, lactic acid 5.4, K 5.6, sCr 2.07. EKG non acute. CXR neg.  UA neg, 80 ketones,  100 protein, glucose > 500.   ?Blood culture sent and started empirically on vancomycin and zosyn pending CT abd/ pelvis given WBC 31K with nausea/ vomiting.  He was treated with insulin gtt, 3.6L LR, and placed on bicarb gtt and admitted to ICU per PCCM.  CT abd/ pelvis did not show any acute abnormality and given lack of fever, reassuring PCT, and clinical symptoms, antibiotics were stopped.  Seen by diabetes coordinator with ongoing eduction.  He remained hemodynamically stable and transitioned off insulin gtt overnight 3/28.  Today he is stable, tolerating POs with stable glucose, therefore he will be discharged home with close follow up with Dr. Chalmers Cater.  Darcel Bayley will not be continued still seen by Dr. Chalmers Cater.   ? ?Discharge Plan by Active Problems   ? ? ?DKA  ?- felt related to due to recent start of mounjaro 4 weeks ago with rapid wean of both short and long acting insulin and decreased appetite/ intake > leading to starvation ketosis.  ?- transitioned off insulin gtt overnight 3/28 ?Plan:   ?- will have Mounjaro in his system till at least Friday.  Need to monitor intake closely and recommend him keep a food/ nutritional journal.  Blood sugars likely to go up and need more frequent adjustments next week ?- continue SSI Humalog (minimize this as able if needed) ?SSI with Humalog ?CBG 70-120 > no units ?  CBG 121- 150> 3 units ?CBG 201- 250 > 5 units  ?CBG 251- 300 > 8 units ?CBG 301- 350> 11 units ?CBG > 350 15 units and call your doctor ? ?- take Tresiba 20 units q HS for now  (had been on 70 units qHS, but only recently taking 0-10 with mounjaro.  If CBG consistently above 200 for 2 days, increase to 30 units.  If consistently high afterwards, call endocrine.  ? ?- advised to get OTC ketone strips to detect early DKA  ?- Will adjust his low side of his dexcom meter to 80 instead of 60 to avoid hypoglycemia and have time for treatment.   ?- restarted his dapagliflozin-metformin today, continue at home ?- do NOT  take Mounjaro until instructed by Dr. Chalmers Cater ?- followup outpt with Dr. Chalmers Cater on July 27, 2021 at 3pm ?- s/p education w/ Diabetes coordinator  ? ? ?AKI secondary to hypovolemia, resolved  ?Hypophosphatemia  ?- AKI resolved.  Phos repleted.  Renal panel w/endocrinology followup ?  ?  ?Leukocytosis, resolving  ?- Felt reactive given no other clinical symptoms other than N/V with negative CT a/p which can be related to above.  Abx stopped 3/28.  PCT reassuring.  Cultures neg to date ?- instructed to seek medical evaluation if develops fever/ infectious symptoms ? ?  ?Hx CAD, HTN, HLD. ?- continue ASA and losartan.  Not taking Toprol XL.  Monitor BP at home as some of these medications might need to be adjusted in time.  ?  ?  ?Seasonal allergies ?- resume claritin ? ? ?Significant Hospital tests/ studies  ? ?3/28  CXR > no acute process  ? ?3/28  CT abd/ pelvis >> ?Prior LEFT inguinal herniorrhaphy. ?No acute intra-abdominal or intrapelvic abnormalities. ?Aortic Atherosclerosis  ? ? ?Procedures   ?N/a ? ?Culture data/antimicrobials   ? ?3/28 SARS/ flu > neg ?3/28 MRSA PCR> neg ?3/28 Bcx1 > ngtd ? ?3/28 vanc x 1 ?3/28 zosyn x 1 ?  ?Consults  ?Diabetes coordinator  ?  ? ?Discharge Exam: ?BP 135/79   Pulse 85   Temp 99.2 ?F (37.3 ?C) (Axillary)   Resp 17   Ht 6' (1.829 m)   Wt 113.4 kg   SpO2 96%   BMI 33.91 kg/m?  ? ?General:  Adult male lying in bed in NAD ?HEENT: MM pink/moist ?Neuro: Aox 3, MAE ?CV: rr, NSR ?PULM:  non labored ?GI: soft, bsx4 active  ?Extremities: warm/dry, no LE edema  ?Skin: no rashes ? ?Labs at discharge  ? ?Lab Results  ?Component Value Date  ? CREATININE 1.02 07/15/2021  ? BUN 18 07/15/2021  ? NA 139 07/15/2021  ? K 3.5 07/15/2021  ? CL 110 07/15/2021  ? CO2 17 (L) 07/15/2021  ? ?Lab Results  ?Component Value Date  ? WBC 15.4 (H) 07/15/2021  ? HGB 11.5 (L) 07/15/2021  ? HCT 35.7 (L) 07/15/2021  ? MCV 78.8 (L) 07/15/2021  ? PLT 313 07/15/2021  ? ?Lab Results  ?Component Value Date  ? ALT  25 07/14/2021  ? AST 22 07/14/2021  ? ALKPHOS 63 07/14/2021  ? BILITOT 1.3 (H) 07/14/2021  ? ?Lab Results  ?Component Value Date  ? INR 1.06 05/17/2016  ? ? ?Current radiological studies   ? ?CT ABDOMEN PELVIS WO CONTRAST ? ?Result Date: 07/14/2021 ?CLINICAL DATA:  Nausea and vomiting, history coronary artery disease, type II diabetes mellitus, hypertension, aortic atherosclerosis, fatty liver EXAM: CT ABDOMEN AND PELVIS WITHOUT CONTRAST TECHNIQUE: Multidetector CT imaging of  the abdomen and pelvis was performed following the standard protocol without IV contrast. RADIATION DOSE REDUCTION: This exam was performed according to the departmental dose-optimization program which includes automated exposure control, adjustment of the mA and/or kV according to patient size and/or use of iterative reconstruction technique. COMPARISON:  03/07/2017 FINDINGS: Lower chest: Lung bases clear Hepatobiliary: Gallbladder and liver normal appearance Pancreas: Atrophic pancreas without mass Spleen: Normal appearance Adrenals/Urinary Tract: Adrenal glands, kidneys, ureters, and bladder normal appearance Stomach/Bowel: Stomach distended by fluid. Normal appendix. Small bowel loops and colon normal appearance. Vascular/Lymphatic: Atherosclerotic calcifications aorta and coronary arteries. Aorta normal caliber. No adenopathy. Few scattered pelvic phleboliths Reproductive: Unremarkable prostate gland and seminal vesicles Other: No free air or free fluid. Minimal infiltrative change at LEFT inguinal region consistent with history of prior herniorrhaphy. No inflammatory process. Musculoskeletal: Few scattered bone islands. Small sclerotic focus RIGHT femoral head/neck unchanged since at least 12/12/2014, likely benign. IMPRESSION: Prior LEFT inguinal herniorrhaphy. No acute intra-abdominal or intrapelvic abnormalities. Aortic Atherosclerosis (ICD10-I70.0). Electronically Signed   By: Lavonia Dana M.D.   On: 07/14/2021 08:49  ? ?DG Chest  Portable 1 View ? ?Result Date: 07/14/2021 ?CLINICAL DATA:  Dyspnea, labored breathing. EXAM: PORTABLE CHEST 1 VIEW COMPARISON:  None. FINDINGS: The heart size and mediastinal contours are within normal limits. No

## 2021-07-15 NOTE — Progress Notes (Addendum)
eLink Physician-Brief Progress Note ?Patient Name: Stephen Arnold ?DOB: 06-Mar-1966 ?MRN: [id number] ? ? ?Date of Service ? 07/15/2021  ?HPI/Events of Note ? K+ 3.5, PHOS 2.1, Cr 1.02  ?eICU Interventions ? K-PHOS 500 mg po BID x 2 doses ordered.  ? ? ? ?  ? ?Kerry Kass Austen Wygant ?07/15/2021, 4:23 AM ?

## 2021-07-16 NOTE — Patient Outreach (Signed)
Received a Hospital Liaison and Insurance notification of Stephen Arnold discharge. ?The Primary Care Physician is using [employer] services.   ?I have sent an email referral to [employer] to call for follow up.   ?  ?Arville Care, CBCS, CMAA ?[place] Management Assistant ?[place] [facility] ?[phone]   ?

## 2021-07-19 LAB — CULTURE, BLOOD (SINGLE)
Culture: NO GROWTH
Special Requests: ADEQUATE

## 2021-07-21 ENCOUNTER — Other Ambulatory Visit: Payer: Self-pay | Admitting: Internal Medicine

## 2021-07-21 ENCOUNTER — Other Ambulatory Visit (HOSPITAL_COMMUNITY): Payer: Self-pay

## 2021-07-21 MED ORDER — REPATHA SURECLICK 140 MG/ML ~~LOC~~ SOAJ
140.0000 mg | SUBCUTANEOUS | 11 refills | Status: DC
  Filled 2021-07-21: qty 2, 28d supply, fill #0
  Filled 2021-08-19: qty 2, 28d supply, fill #1
  Filled 2021-09-15: qty 2, 28d supply, fill #2
  Filled 2021-10-13: qty 2, 28d supply, fill #3
  Filled 2021-11-11: qty 2, 28d supply, fill #4
  Filled 2021-12-16: qty 2, 28d supply, fill #5
  Filled 2022-02-02: qty 2, 28d supply, fill #6
  Filled 2022-02-22 – 2022-02-24 (×2): qty 2, 28d supply, fill #7
  Filled 2022-03-29: qty 2, 28d supply, fill #8
  Filled 2022-04-15 – 2022-04-22 (×2): qty 2, 28d supply, fill #9
  Filled 2022-05-31: qty 2, 28d supply, fill #10

## 2021-07-21 NOTE — Telephone Encounter (Signed)
Looks to be an error that med was removed from med list at discharge - ok to refill med ?

## 2021-07-22 ENCOUNTER — Other Ambulatory Visit (HOSPITAL_BASED_OUTPATIENT_CLINIC_OR_DEPARTMENT_OTHER): Payer: Self-pay

## 2021-07-22 MED ORDER — CELECOXIB 200 MG PO CAPS
ORAL_CAPSULE | ORAL | 2 refills | Status: DC
  Filled 2021-07-22 – 2021-09-28 (×3): qty 90, 90d supply, fill #0
  Filled 2021-09-28 – 2021-12-22 (×2): qty 90, 90d supply, fill #1
  Filled ????-??-??: fill #1

## 2021-07-23 ENCOUNTER — Other Ambulatory Visit (HOSPITAL_COMMUNITY): Payer: Self-pay

## 2021-07-27 DIAGNOSIS — E1065 Type 1 diabetes mellitus with hyperglycemia: Secondary | ICD-10-CM | POA: Diagnosis not present

## 2021-07-30 DIAGNOSIS — E1065 Type 1 diabetes mellitus with hyperglycemia: Secondary | ICD-10-CM | POA: Diagnosis not present

## 2021-08-05 ENCOUNTER — Other Ambulatory Visit (HOSPITAL_COMMUNITY): Payer: Self-pay

## 2021-08-06 ENCOUNTER — Other Ambulatory Visit: Payer: Self-pay | Admitting: Internal Medicine

## 2021-08-06 ENCOUNTER — Other Ambulatory Visit (HOSPITAL_COMMUNITY): Payer: Self-pay

## 2021-08-06 MED ORDER — LOSARTAN POTASSIUM 25 MG PO TABS
25.0000 mg | ORAL_TABLET | Freq: Every day | ORAL | 0 refills | Status: DC
  Filled 2021-08-06: qty 30, 30d supply, fill #0

## 2021-08-07 ENCOUNTER — Other Ambulatory Visit (HOSPITAL_COMMUNITY): Payer: Self-pay

## 2021-08-19 ENCOUNTER — Other Ambulatory Visit (HOSPITAL_COMMUNITY): Payer: Self-pay

## 2021-08-19 MED ORDER — INSULIN LISPRO (1 UNIT DIAL) 100 UNIT/ML (KWIKPEN)
PEN_INJECTOR | SUBCUTANEOUS | 4 refills | Status: DC
  Filled 2021-08-19: qty 30, 33d supply, fill #0
  Filled 2021-09-15: qty 30, 33d supply, fill #1
  Filled 2021-10-23: qty 30, 33d supply, fill #2
  Filled 2021-11-24: qty 30, 33d supply, fill #3
  Filled 2021-12-16 (×2): qty 30, 33d supply, fill #4

## 2021-08-19 MED ORDER — TRESIBA FLEXTOUCH 100 UNIT/ML ~~LOC~~ SOPN
40.0000 [IU] | PEN_INJECTOR | Freq: Every day | SUBCUTANEOUS | 0 refills | Status: DC
  Filled 2021-08-19: qty 12, 30d supply, fill #0

## 2021-09-04 ENCOUNTER — Other Ambulatory Visit (HOSPITAL_COMMUNITY): Payer: Self-pay

## 2021-09-09 ENCOUNTER — Other Ambulatory Visit: Payer: Self-pay | Admitting: Internal Medicine

## 2021-09-10 ENCOUNTER — Other Ambulatory Visit (HOSPITAL_COMMUNITY): Payer: Self-pay

## 2021-09-10 MED ORDER — LOSARTAN POTASSIUM 25 MG PO TABS
25.0000 mg | ORAL_TABLET | Freq: Every day | ORAL | 3 refills | Status: DC
Start: 2021-09-10 — End: 2022-01-12
  Filled 2021-09-10: qty 30, 30d supply, fill #0
  Filled 2021-10-13: qty 30, 30d supply, fill #1
  Filled 2021-11-11: qty 30, 30d supply, fill #2
  Filled 2021-12-16: qty 30, 30d supply, fill #3

## 2021-09-15 ENCOUNTER — Other Ambulatory Visit (HOSPITAL_COMMUNITY): Payer: Self-pay

## 2021-09-15 MED ORDER — INSULIN PEN NEEDLE 32G X 4 MM MISC
11 refills | Status: DC
  Filled 2021-09-15: qty 200, 28d supply, fill #0
  Filled 2022-02-22: qty 200, 29d supply, fill #0

## 2021-09-15 MED ORDER — TRESIBA FLEXTOUCH 100 UNIT/ML ~~LOC~~ SOPN
40.0000 [IU] | PEN_INJECTOR | Freq: Every day | SUBCUTANEOUS | 5 refills | Status: DC
  Filled 2021-09-15: qty 12, 30d supply, fill #0
  Filled 2021-10-13: qty 12, 30d supply, fill #1
  Filled 2021-10-23 – 2021-11-24 (×2): qty 12, 30d supply, fill #2
  Filled 2021-12-16 (×2): qty 12, 30d supply, fill #3
  Filled 2022-02-02: qty 12, 30d supply, fill #4
  Filled 2022-02-26: qty 12, 30d supply, fill #5

## 2021-09-28 ENCOUNTER — Other Ambulatory Visit (HOSPITAL_BASED_OUTPATIENT_CLINIC_OR_DEPARTMENT_OTHER): Payer: Self-pay

## 2021-09-28 ENCOUNTER — Other Ambulatory Visit (HOSPITAL_COMMUNITY): Payer: Self-pay

## 2021-10-02 ENCOUNTER — Other Ambulatory Visit (HOSPITAL_COMMUNITY): Payer: Self-pay

## 2021-10-13 ENCOUNTER — Other Ambulatory Visit (HOSPITAL_COMMUNITY): Payer: Self-pay

## 2021-10-14 ENCOUNTER — Other Ambulatory Visit (HOSPITAL_COMMUNITY): Payer: Self-pay

## 2021-10-15 ENCOUNTER — Other Ambulatory Visit (HOSPITAL_COMMUNITY): Payer: Self-pay

## 2021-10-23 ENCOUNTER — Other Ambulatory Visit (HOSPITAL_COMMUNITY): Payer: Self-pay

## 2021-11-11 ENCOUNTER — Other Ambulatory Visit (HOSPITAL_COMMUNITY): Payer: Self-pay

## 2021-11-11 DIAGNOSIS — E1065 Type 1 diabetes mellitus with hyperglycemia: Secondary | ICD-10-CM | POA: Diagnosis not present

## 2021-11-11 DIAGNOSIS — E785 Hyperlipidemia, unspecified: Secondary | ICD-10-CM | POA: Diagnosis not present

## 2021-11-11 DIAGNOSIS — E1165 Type 2 diabetes mellitus with hyperglycemia: Secondary | ICD-10-CM | POA: Diagnosis not present

## 2021-11-11 DIAGNOSIS — I10 Essential (primary) hypertension: Secondary | ICD-10-CM | POA: Diagnosis not present

## 2021-11-11 DIAGNOSIS — I2510 Atherosclerotic heart disease of native coronary artery without angina pectoris: Secondary | ICD-10-CM | POA: Diagnosis not present

## 2021-11-11 MED ORDER — XIGDUO XR 10-1000 MG PO TB24
1.0000 | ORAL_TABLET | Freq: Every day | ORAL | 1 refills | Status: DC
Start: 2021-11-11 — End: 2022-03-10
  Filled 2021-11-11: qty 30, 30d supply, fill #0
  Filled 2021-12-16: qty 30, 30d supply, fill #1

## 2021-11-12 ENCOUNTER — Other Ambulatory Visit (HOSPITAL_COMMUNITY): Payer: Self-pay

## 2021-11-24 ENCOUNTER — Other Ambulatory Visit (HOSPITAL_COMMUNITY): Payer: Self-pay

## 2021-12-16 ENCOUNTER — Other Ambulatory Visit (HOSPITAL_COMMUNITY): Payer: Self-pay
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
## 2021-12-22 ENCOUNTER — Other Ambulatory Visit (HOSPITAL_COMMUNITY): Payer: Self-pay

## 2021-12-22 ENCOUNTER — Ambulatory Visit: Payer: 59 | Attending: Internal Medicine | Admitting: Internal Medicine

## 2021-12-22 ENCOUNTER — Encounter: Payer: Self-pay | Admitting: Internal Medicine

## 2021-12-22 VITALS — BP 110/70 | HR 80 | Ht 72.0 in | Wt 229.8 lb

## 2021-12-22 DIAGNOSIS — R0683 Snoring: Secondary | ICD-10-CM

## 2021-12-22 DIAGNOSIS — I2510 Atherosclerotic heart disease of native coronary artery without angina pectoris: Secondary | ICD-10-CM | POA: Diagnosis not present

## 2021-12-22 MED ORDER — NITROGLYCERIN 0.4 MG SL SUBL
0.4000 mg | SUBLINGUAL_TABLET | SUBLINGUAL | 3 refills | Status: AC | PRN
  Filled 2021-12-22: qty 25, 7d supply, fill #0
  Filled 2022-04-15: qty 25, 7d supply, fill #1

## 2021-12-22 NOTE — Patient Instructions (Addendum)
Medication Instructions:   *If you need a refill on your cardiac medications before your next appointment, please call your pharmacy*   Lab Work:  If you have labs (blood work) drawn today and your tests are completely normal, you will receive your results only by: [facility] (if you have MyChart) OR A paper copy in the mail If you have any lab test that is abnormal or we need to change your treatment, we will call you to review the results.   Testing/Procedures:  Home Sleep Study     Follow-Up: At [facility], you and your health needs are our priority.  As part of our continuing mission to provide you with exceptional heart care, we have created designated Provider Care Teams.  These Care Teams include your primary Cardiologist (physician) and Advanced Practice Providers (APPs -  Physician Assistants and Nurse Practitioners) who all work together to provide you with the care you need, when you need it.  We recommend signing up for the patient portal called "MyChart".  Sign up information is provided on this After Visit Summary.  MyChart is used to connect with patients for Virtual Visits (Telemedicine).  Patients are able to view lab/test results, encounter notes, upcoming appointments, etc.  Non-urgent messages can be sent to your provider as well.   To learn more about what you can do with MyChart, go to [email or web address].    Your next appointment:   8 month(s)  The format for your next appointment:   In Person  Provider:   Dorris Carnes, MD     Other Instructions   Important Information About Sugar

## 2021-12-23 DIAGNOSIS — I10 Essential (primary) hypertension: Secondary | ICD-10-CM | POA: Diagnosis not present

## 2021-12-23 DIAGNOSIS — E1065 Type 1 diabetes mellitus with hyperglycemia: Secondary | ICD-10-CM | POA: Diagnosis not present

## 2021-12-23 DIAGNOSIS — E785 Hyperlipidemia, unspecified: Secondary | ICD-10-CM | POA: Diagnosis not present

## 2021-12-29 ENCOUNTER — Other Ambulatory Visit (HOSPITAL_COMMUNITY): Payer: Self-pay

## 2021-12-29 MED ORDER — DEXCOM G6 SENSOR MISC
6 refills | Status: DC
  Filled 2021-12-29: qty 3, 30d supply, fill #0
  Filled 2022-02-02: qty 3, 30d supply, fill #1

## 2021-12-30 ENCOUNTER — Other Ambulatory Visit (HOSPITAL_COMMUNITY): Payer: Self-pay

## 2022-01-12 ENCOUNTER — Other Ambulatory Visit (HOSPITAL_COMMUNITY): Payer: Self-pay

## 2022-01-12 ENCOUNTER — Other Ambulatory Visit: Payer: Self-pay | Admitting: Internal Medicine

## 2022-01-12 MED ORDER — LOSARTAN POTASSIUM 25 MG PO TABS
25.0000 mg | ORAL_TABLET | Freq: Every day | ORAL | 3 refills | Status: DC
  Filled 2022-01-12: qty 90, 90d supply, fill #0
  Filled 2022-03-29: qty 90, 90d supply, fill #1
  Filled 2022-07-07: qty 90, 90d supply, fill #2
  Filled 2022-10-06: qty 90, 90d supply, fill #3

## 2022-01-27 ENCOUNTER — Ambulatory Visit (HOSPITAL_BASED_OUTPATIENT_CLINIC_OR_DEPARTMENT_OTHER): Payer: 59 | Admitting: Cardiovascular Disease

## 2022-01-27 ENCOUNTER — Encounter (HOSPITAL_BASED_OUTPATIENT_CLINIC_OR_DEPARTMENT_OTHER): Payer: Self-pay

## 2022-01-27 DIAGNOSIS — R0683 Snoring: Secondary | ICD-10-CM

## 2022-02-02 ENCOUNTER — Other Ambulatory Visit (HOSPITAL_COMMUNITY): Payer: Self-pay

## 2022-02-03 ENCOUNTER — Other Ambulatory Visit (HOSPITAL_COMMUNITY): Payer: Self-pay

## 2022-02-03 MED ORDER — DEXCOM G6 SENSOR MISC
6 refills | Status: DC
  Filled 2022-02-03 – 2022-02-26 (×3): qty 3, 30d supply, fill #0

## 2022-02-03 MED ORDER — INSULIN LISPRO (1 UNIT DIAL) 100 UNIT/ML (KWIKPEN)
18.0000 [IU] | PEN_INJECTOR | Freq: Three times a day (TID) | SUBCUTANEOUS | 6 refills | Status: DC
  Filled 2022-02-03 – 2022-02-26 (×2): qty 30, 34d supply, fill #0
  Filled 2022-03-29: qty 30, 34d supply, fill #1
  Filled 2022-04-15 – 2022-04-28 (×2): qty 30, 34d supply, fill #2
  Filled 2022-10-06: qty 30, 34d supply, fill #3
  Filled 2022-11-08: qty 30, 34d supply, fill #4
  Filled 2022-12-27: qty 30, 34d supply, fill #5
  Filled 2023-01-28 (×2): qty 30, 34d supply, fill #6

## 2022-02-05 ENCOUNTER — Encounter (HOSPITAL_BASED_OUTPATIENT_CLINIC_OR_DEPARTMENT_OTHER): Payer: Self-pay

## 2022-02-05 ENCOUNTER — Encounter (HOSPITAL_BASED_OUTPATIENT_CLINIC_OR_DEPARTMENT_OTHER): Payer: 59 | Admitting: Cardiovascular Disease

## 2022-02-12 ENCOUNTER — Other Ambulatory Visit (HOSPITAL_COMMUNITY): Payer: Self-pay

## 2022-02-22 ENCOUNTER — Other Ambulatory Visit (HOSPITAL_COMMUNITY): Payer: Self-pay

## 2022-02-24 ENCOUNTER — Other Ambulatory Visit (HOSPITAL_COMMUNITY): Payer: Self-pay

## 2022-02-26 ENCOUNTER — Other Ambulatory Visit (HOSPITAL_COMMUNITY): Payer: Self-pay

## 2022-03-09 ENCOUNTER — Other Ambulatory Visit (HOSPITAL_COMMUNITY): Payer: Self-pay

## 2022-03-10 ENCOUNTER — Other Ambulatory Visit (HOSPITAL_COMMUNITY): Payer: Self-pay

## 2022-03-10 ENCOUNTER — Other Ambulatory Visit (HOSPITAL_BASED_OUTPATIENT_CLINIC_OR_DEPARTMENT_OTHER): Payer: Self-pay

## 2022-03-10 MED ORDER — XIGDUO XR 10-1000 MG PO TB24
0.5000 | ORAL_TABLET | Freq: Every day | ORAL | 1 refills | Status: AC
Start: 2022-03-10 — End: ?
  Filled 2022-03-10: qty 45, 90d supply, fill #0

## 2022-03-10 MED ORDER — CELECOXIB 200 MG PO CAPS
200.0000 mg | ORAL_CAPSULE | Freq: Every day | ORAL | 2 refills | Status: DC | PRN
  Filled 2022-03-10 – 2022-03-16 (×3): qty 90, 90d supply, fill #0
  Filled 2022-05-31: qty 90, 90d supply, fill #1
  Filled 2022-08-25: qty 90, 90d supply, fill #2

## 2022-03-10 MED ORDER — XIGDUO XR 10-1000 MG PO TB24
0.5000 | ORAL_TABLET | Freq: Every day | ORAL | 1 refills | Status: DC
  Filled 2022-03-10: qty 15, 30d supply, fill #0

## 2022-03-16 ENCOUNTER — Other Ambulatory Visit (HOSPITAL_COMMUNITY): Payer: Self-pay

## 2022-03-17 ENCOUNTER — Other Ambulatory Visit (HOSPITAL_COMMUNITY): Payer: Self-pay

## 2022-03-17 DIAGNOSIS — M65332 Trigger finger, left middle finger: Secondary | ICD-10-CM | POA: Diagnosis not present

## 2022-03-18 ENCOUNTER — Other Ambulatory Visit (HOSPITAL_COMMUNITY): Payer: Self-pay

## 2022-03-19 ENCOUNTER — Other Ambulatory Visit (HOSPITAL_COMMUNITY): Payer: Self-pay

## 2022-03-19 DIAGNOSIS — M65332 Trigger finger, left middle finger: Secondary | ICD-10-CM | POA: Diagnosis not present

## 2022-03-19 MED ORDER — OXYCODONE-ACETAMINOPHEN 5-325 MG PO TABS
1.0000 | ORAL_TABLET | Freq: Four times a day (QID) | ORAL | 0 refills | Status: DC | PRN
  Filled 2022-03-19: qty 20, 3d supply, fill #0

## 2022-03-24 ENCOUNTER — Other Ambulatory Visit (HOSPITAL_COMMUNITY): Payer: Self-pay

## 2022-03-25 ENCOUNTER — Other Ambulatory Visit (HOSPITAL_COMMUNITY): Payer: Self-pay

## 2022-03-25 MED ORDER — METFORMIN HCL ER 500 MG PO TB24
500.0000 mg | ORAL_TABLET | Freq: Two times a day (BID) | ORAL | 1 refills | Status: DC
  Filled 2022-03-25: qty 60, 30d supply, fill #0
  Filled 2022-04-15 – 2022-04-22 (×2): qty 60, 30d supply, fill #1

## 2022-03-29 ENCOUNTER — Other Ambulatory Visit (HOSPITAL_COMMUNITY): Payer: Self-pay

## 2022-03-29 MED ORDER — TRESIBA FLEXTOUCH 100 UNIT/ML ~~LOC~~ SOPN
40.0000 [IU] | PEN_INJECTOR | Freq: Every day | SUBCUTANEOUS | 5 refills | Status: DC
  Filled 2022-03-29: qty 12, 30d supply, fill #0
  Filled 2022-04-15 – 2022-04-22 (×2): qty 12, 30d supply, fill #1
  Filled 2023-02-07 – 2023-03-01 (×2): qty 12, 30d supply, fill #2
  Filled 2023-03-22: qty 12, 30d supply, fill #3
  Filled 2023-03-23: qty 3, 7d supply, fill #3

## 2022-03-30 ENCOUNTER — Other Ambulatory Visit (HOSPITAL_COMMUNITY): Payer: Self-pay

## 2022-03-31 ENCOUNTER — Other Ambulatory Visit (HOSPITAL_COMMUNITY): Payer: Self-pay

## 2022-03-31 DIAGNOSIS — E1165 Type 2 diabetes mellitus with hyperglycemia: Secondary | ICD-10-CM | POA: Diagnosis not present

## 2022-03-31 DIAGNOSIS — Z23 Encounter for immunization: Secondary | ICD-10-CM | POA: Diagnosis not present

## 2022-03-31 DIAGNOSIS — I10 Essential (primary) hypertension: Secondary | ICD-10-CM | POA: Diagnosis not present

## 2022-03-31 DIAGNOSIS — E1065 Type 1 diabetes mellitus with hyperglycemia: Secondary | ICD-10-CM | POA: Diagnosis not present

## 2022-03-31 DIAGNOSIS — E785 Hyperlipidemia, unspecified: Secondary | ICD-10-CM | POA: Diagnosis not present

## 2022-03-31 MED ORDER — TRESIBA FLEXTOUCH 100 UNIT/ML ~~LOC~~ SOPN
PEN_INJECTOR | SUBCUTANEOUS | 3 refills | Status: DC
  Filled 2022-03-31: qty 6, 10d supply, fill #0
  Filled 2022-06-09: qty 15, 25d supply, fill #0
  Filled 2022-07-25 – 2022-07-30 (×2): qty 15, 25d supply, fill #1
  Filled 2022-08-20 – 2022-08-25 (×2): qty 15, 25d supply, fill #2
  Filled 2022-10-04: qty 15, 25d supply, fill #3

## 2022-03-31 MED ORDER — INSULIN LISPRO (1 UNIT DIAL) 100 UNIT/ML (KWIKPEN)
12.0000 [IU] | PEN_INJECTOR | Freq: Three times a day (TID) | SUBCUTANEOUS | 3 refills | Status: DC
  Filled 2022-03-31 – 2023-03-07 (×3): qty 15, 34d supply, fill #0

## 2022-04-08 ENCOUNTER — Other Ambulatory Visit (HOSPITAL_COMMUNITY): Payer: Self-pay

## 2022-04-09 ENCOUNTER — Other Ambulatory Visit (HOSPITAL_COMMUNITY): Payer: Self-pay

## 2022-04-15 ENCOUNTER — Other Ambulatory Visit (HOSPITAL_COMMUNITY): Payer: Self-pay

## 2022-04-22 ENCOUNTER — Other Ambulatory Visit (HOSPITAL_COMMUNITY): Payer: Self-pay

## 2022-04-28 ENCOUNTER — Other Ambulatory Visit (HOSPITAL_COMMUNITY): Payer: Self-pay

## 2022-04-29 ENCOUNTER — Other Ambulatory Visit (HOSPITAL_COMMUNITY): Payer: Self-pay

## 2022-05-31 ENCOUNTER — Other Ambulatory Visit (HOSPITAL_COMMUNITY): Payer: Self-pay

## 2022-06-01 ENCOUNTER — Other Ambulatory Visit (HOSPITAL_COMMUNITY): Payer: Self-pay

## 2022-06-01 MED ORDER — METFORMIN HCL ER 500 MG PO TB24
1000.0000 mg | ORAL_TABLET | Freq: Every evening | ORAL | 1 refills | Status: DC
  Filled 2022-06-01: qty 60, 30d supply, fill #0
  Filled 2022-08-11: qty 60, 30d supply, fill #1

## 2022-06-09 ENCOUNTER — Other Ambulatory Visit (HOSPITAL_COMMUNITY): Payer: Self-pay

## 2022-06-15 DIAGNOSIS — M65332 Trigger finger, left middle finger: Secondary | ICD-10-CM | POA: Diagnosis not present

## 2022-06-15 DIAGNOSIS — M24542 Contracture, left hand: Secondary | ICD-10-CM | POA: Diagnosis not present

## 2022-06-17 ENCOUNTER — Other Ambulatory Visit: Payer: Self-pay

## 2022-07-07 ENCOUNTER — Other Ambulatory Visit (HOSPITAL_COMMUNITY): Payer: Self-pay

## 2022-07-07 MED ORDER — DEXCOM G6 SENSOR MISC
6 refills | Status: DC
  Filled 2022-07-07: qty 3, 30d supply, fill #0

## 2022-07-12 ENCOUNTER — Other Ambulatory Visit (HOSPITAL_COMMUNITY): Payer: Self-pay

## 2022-07-12 MED ORDER — DEXCOM G6 SENSOR MISC
0 refills | Status: DC
  Filled 2022-07-12: qty 3, 30d supply, fill #0

## 2022-07-12 MED ORDER — DEXCOM G7 SENSOR MISC
3 refills | Status: DC
  Filled 2022-07-12: qty 3, 30d supply, fill #0
  Filled 2022-08-11: qty 3, 30d supply, fill #1
  Filled 2022-08-20: qty 2, 20d supply, fill #2

## 2022-07-27 DIAGNOSIS — M24542 Contracture, left hand: Secondary | ICD-10-CM | POA: Diagnosis not present

## 2022-07-30 ENCOUNTER — Other Ambulatory Visit (HOSPITAL_COMMUNITY): Payer: Self-pay

## 2022-07-30 ENCOUNTER — Other Ambulatory Visit (HOSPITAL_BASED_OUTPATIENT_CLINIC_OR_DEPARTMENT_OTHER): Payer: Self-pay

## 2022-08-11 ENCOUNTER — Other Ambulatory Visit (HOSPITAL_COMMUNITY): Payer: Self-pay

## 2022-08-20 ENCOUNTER — Other Ambulatory Visit (HOSPITAL_COMMUNITY): Payer: Self-pay

## 2022-08-25 ENCOUNTER — Other Ambulatory Visit (HOSPITAL_COMMUNITY): Payer: Self-pay

## 2022-08-25 MED ORDER — DEXCOM G7 SENSOR MISC
3 refills | Status: DC
  Filled 2022-08-25: qty 3, 30d supply, fill #0
  Filled 2022-10-04: qty 3, 30d supply, fill #1
  Filled 2022-11-04: qty 3, 30d supply, fill #2
  Filled 2022-11-29: qty 3, 30d supply, fill #3

## 2022-08-26 ENCOUNTER — Other Ambulatory Visit (HOSPITAL_COMMUNITY): Payer: Self-pay

## 2022-08-26 MED ORDER — METFORMIN HCL ER 500 MG PO TB24
1000.0000 mg | ORAL_TABLET | Freq: Every evening | ORAL | 0 refills | Status: DC
  Filled 2022-11-08: qty 60, 30d supply, fill #0

## 2022-10-04 ENCOUNTER — Other Ambulatory Visit: Payer: Self-pay | Admitting: Internal Medicine

## 2022-10-04 ENCOUNTER — Other Ambulatory Visit (HOSPITAL_COMMUNITY): Payer: Self-pay

## 2022-10-04 MED ORDER — REPATHA SURECLICK 140 MG/ML ~~LOC~~ SOAJ
140.0000 mg | SUBCUTANEOUS | 11 refills | Status: DC
  Filled 2022-10-04 – 2022-10-19 (×4): qty 2, 28d supply, fill #0

## 2022-10-07 ENCOUNTER — Other Ambulatory Visit (HOSPITAL_COMMUNITY): Payer: Self-pay

## 2022-10-07 ENCOUNTER — Other Ambulatory Visit: Payer: Self-pay

## 2022-10-08 ENCOUNTER — Other Ambulatory Visit (HOSPITAL_COMMUNITY): Payer: Self-pay

## 2022-10-19 ENCOUNTER — Telehealth: Payer: Self-pay | Admitting: Internal Medicine

## 2022-10-19 ENCOUNTER — Other Ambulatory Visit (HOSPITAL_COMMUNITY): Payer: Self-pay

## 2022-10-19 NOTE — Telephone Encounter (Signed)
Pt c/o medication issue:  1. Name of Medication:   Evolocumab (REPATHA SURECLICK) 140 MG/ML SOAJ    2. How are you currently taking this medication (dosage and times per day)?   3. Are you having a reaction (difficulty breathing--STAT)?   4. What is your medication issue? Wife is calling to get PA for this medication filled out and sent. She states that they are requesting a generic statin, but she would like to discuss this further with Dr. Tenny Craw' nurse. Please advise.

## 2022-10-19 NOTE — Telephone Encounter (Signed)
I do not see where we have received any prior auth requests for pt. Will forward to pharm tech pool to assist.

## 2022-10-19 NOTE — Telephone Encounter (Signed)
Spoke with Pt's wife. States they have tried multiple times to get prior auth for his evolocumab (repatha). Pt's wife states that the pharmacy has sent in another request to [place] today. Mauro Kaufmann (pt's wife) I would send a message out to the team that handles it and see if we can get this done for him.  Message sent to Pharm D and RX auth teams.

## 2022-10-20 ENCOUNTER — Other Ambulatory Visit (HOSPITAL_COMMUNITY): Payer: Self-pay

## 2022-10-20 ENCOUNTER — Telehealth: Payer: Self-pay

## 2022-10-20 MED ORDER — REPATHA SURECLICK 140 MG/ML ~~LOC~~ SOAJ
140.0000 mg | SUBCUTANEOUS | 11 refills | Status: DC
  Filled 2022-10-20: qty 2, 28d supply, fill #0
  Filled 2022-11-19: qty 2, 28d supply, fill #1
  Filled 2022-12-27: qty 2, 28d supply, fill #2
  Filled 2023-01-28: qty 2, 28d supply, fill #3
  Filled 2023-02-23 – 2023-02-28 (×2): qty 2, 28d supply, fill #4
  Filled 2023-03-25: qty 2, 28d supply, fill #5
  Filled 2023-04-07 – 2023-04-18 (×2): qty 2, 28d supply, fill #6
  Filled 2023-06-27 (×2): qty 2, 28d supply, fill #7
  Filled 2023-08-05: qty 2, 28d supply, fill #8
  Filled 2023-09-05: qty 2, 28d supply, fill #9
  Filled 2023-10-04: qty 2, 28d supply, fill #10

## 2022-10-20 NOTE — Addendum Note (Signed)
Addended by: Rollen Selders E on: 10/20/2022 02:51 PM   Modules accepted: Orders

## 2022-10-20 NOTE — Telephone Encounter (Signed)
Pharmacy Patient Advocate Encounter   Received notification from [facility] that prior authorization for REPATHA is needed.    PA submitted on 10/20/22 Key [id number] Status is pending  Haze Rushing, CPhT Pharmacy Patient Advocate Specialist Direct Number: [phone] Fax: [phone]

## 2022-10-20 NOTE — Telephone Encounter (Signed)
Left message for pt that PA approved, refill sent to pharmacy.

## 2022-10-20 NOTE — Telephone Encounter (Signed)
PA initiated, please see separate encounter for updates on determination. (I will route you back in once a decision has been made)  Madisyn Mawhinney, CPhT Pharmacy Patient Advocate Specialist Direct Number: [phone] Fax: [phone]  

## 2022-10-20 NOTE — Telephone Encounter (Signed)
Pharmacy Patient Advocate Encounter  Prior Authorization for REPATHA has been approved.    Effective dates: 10/20/22 through 10/19/23  Stephen Arnold, CPhT Pharmacy Patient Advocate Specialist Direct Number: [phone] Fax: [phone]

## 2022-10-25 ENCOUNTER — Other Ambulatory Visit (HOSPITAL_COMMUNITY): Payer: Self-pay

## 2022-11-01 ENCOUNTER — Other Ambulatory Visit (HOSPITAL_COMMUNITY): Payer: Self-pay

## 2022-11-02 ENCOUNTER — Other Ambulatory Visit (HOSPITAL_COMMUNITY): Payer: Self-pay

## 2022-11-02 DIAGNOSIS — E785 Hyperlipidemia, unspecified: Secondary | ICD-10-CM | POA: Diagnosis not present

## 2022-11-02 DIAGNOSIS — E1065 Type 1 diabetes mellitus with hyperglycemia: Secondary | ICD-10-CM | POA: Diagnosis not present

## 2022-11-02 DIAGNOSIS — I10 Essential (primary) hypertension: Secondary | ICD-10-CM | POA: Diagnosis not present

## 2022-11-02 MED ORDER — TRESIBA FLEXTOUCH 100 UNIT/ML ~~LOC~~ SOPN
60.0000 [IU] | PEN_INJECTOR | Freq: Every day | SUBCUTANEOUS | 3 refills | Status: DC
  Filled 2022-11-02: qty 15, 25d supply, fill #0
  Filled 2022-11-29: qty 15, 25d supply, fill #1
  Filled 2022-12-27: qty 15, 25d supply, fill #2
  Filled 2023-01-28: qty 15, 25d supply, fill #3

## 2022-11-04 ENCOUNTER — Other Ambulatory Visit (HOSPITAL_COMMUNITY): Payer: Self-pay

## 2022-11-05 ENCOUNTER — Other Ambulatory Visit (HOSPITAL_COMMUNITY): Payer: Self-pay

## 2022-11-08 ENCOUNTER — Other Ambulatory Visit (HOSPITAL_COMMUNITY): Payer: Self-pay

## 2022-11-11 ENCOUNTER — Other Ambulatory Visit (HOSPITAL_COMMUNITY): Payer: Self-pay

## 2022-11-11 DIAGNOSIS — E785 Hyperlipidemia, unspecified: Secondary | ICD-10-CM | POA: Diagnosis not present

## 2022-11-11 DIAGNOSIS — E1065 Type 1 diabetes mellitus with hyperglycemia: Secondary | ICD-10-CM | POA: Diagnosis not present

## 2022-11-11 DIAGNOSIS — I10 Essential (primary) hypertension: Secondary | ICD-10-CM | POA: Diagnosis not present

## 2022-11-11 DIAGNOSIS — I2510 Atherosclerotic heart disease of native coronary artery without angina pectoris: Secondary | ICD-10-CM | POA: Diagnosis not present

## 2022-11-11 MED ORDER — DAPAGLIFLOZIN PROPANEDIOL 5 MG PO TABS
5.0000 mg | ORAL_TABLET | Freq: Every day | ORAL | 3 refills | Status: DC
  Filled 2023-04-07: qty 30, 30d supply, fill #0
  Filled 2023-05-19: qty 30, 30d supply, fill #1
  Filled 2023-06-20: qty 30, 30d supply, fill #2
  Filled 2023-07-19: qty 30, 30d supply, fill #3

## 2022-11-11 MED ORDER — DAPAGLIFLOZIN PROPANEDIOL 5 MG PO TABS
5.0000 mg | ORAL_TABLET | Freq: Every day | ORAL | 3 refills | Status: DC
  Filled 2022-11-11: qty 30, 30d supply, fill #0
  Filled 2022-12-06: qty 30, 30d supply, fill #1
  Filled 2023-01-10: qty 30, 30d supply, fill #2
  Filled 2023-02-07: qty 30, 30d supply, fill #3

## 2022-11-12 ENCOUNTER — Other Ambulatory Visit (HOSPITAL_COMMUNITY): Payer: Self-pay

## 2022-11-12 MED ORDER — INSULIN PEN NEEDLE 32G X 4 MM MISC
11 refills | Status: DC
  Filled 2022-11-12: qty 200, 28d supply, fill #0

## 2022-11-12 MED ORDER — INSULIN PEN NEEDLE 32G X 4 MM MISC
11 refills | Status: DC
Start: 2022-11-11 — End: 2023-04-01
  Filled 2022-11-12: qty 200, 28d supply, fill #0
  Filled 2022-12-27: qty 200, 28d supply, fill #1
  Filled 2023-01-28: qty 200, 28d supply, fill #2
  Filled 2023-02-23 – 2023-03-07 (×2): qty 200, 28d supply, fill #3

## 2022-11-15 ENCOUNTER — Other Ambulatory Visit (HOSPITAL_COMMUNITY): Payer: Self-pay

## 2022-11-19 ENCOUNTER — Other Ambulatory Visit (HOSPITAL_COMMUNITY): Payer: Self-pay

## 2022-11-29 ENCOUNTER — Other Ambulatory Visit (HOSPITAL_COMMUNITY): Payer: Self-pay

## 2022-12-06 ENCOUNTER — Other Ambulatory Visit (HOSPITAL_COMMUNITY): Payer: Self-pay

## 2022-12-06 MED ORDER — CELECOXIB 200 MG PO CAPS
200.0000 mg | ORAL_CAPSULE | Freq: Every day | ORAL | 2 refills | Status: DC | PRN
  Filled 2022-12-06: qty 90, 90d supply, fill #0
  Filled 2023-03-01: qty 90, 90d supply, fill #1
  Filled 2023-04-07 – 2023-05-19 (×2): qty 90, 90d supply, fill #2

## 2022-12-08 ENCOUNTER — Other Ambulatory Visit (HOSPITAL_COMMUNITY): Payer: Self-pay

## 2022-12-27 ENCOUNTER — Other Ambulatory Visit (HOSPITAL_COMMUNITY): Payer: Self-pay

## 2022-12-27 ENCOUNTER — Other Ambulatory Visit: Payer: Self-pay

## 2022-12-28 ENCOUNTER — Other Ambulatory Visit (HOSPITAL_COMMUNITY): Payer: Self-pay

## 2022-12-28 MED ORDER — DEXCOM G7 SENSOR MISC
5 refills | Status: AC
  Filled 2022-12-28 – 2023-06-27 (×3): qty 3, 30d supply, fill #0
  Filled 2023-08-04: qty 3, 30d supply, fill #1
  Filled 2023-09-05: qty 3, 30d supply, fill #2
  Filled 2023-11-02: qty 3, 30d supply, fill #3

## 2022-12-30 ENCOUNTER — Other Ambulatory Visit (HOSPITAL_COMMUNITY): Payer: Self-pay

## 2022-12-30 MED ORDER — DEXCOM G7 SENSOR MISC
3 refills | Status: DC
  Filled 2022-12-30 – 2023-02-28 (×4): qty 3, 30d supply, fill #0
  Filled 2023-03-25: qty 3, 30d supply, fill #1
  Filled 2023-04-07 – 2023-04-18 (×2): qty 3, 30d supply, fill #2
  Filled 2023-05-26: qty 3, 30d supply, fill #3

## 2023-01-10 ENCOUNTER — Other Ambulatory Visit: Payer: Self-pay

## 2023-01-10 ENCOUNTER — Other Ambulatory Visit: Payer: Self-pay | Admitting: Internal Medicine

## 2023-01-10 ENCOUNTER — Other Ambulatory Visit (HOSPITAL_COMMUNITY): Payer: Self-pay

## 2023-01-10 MED ORDER — METFORMIN HCL ER 500 MG PO TB24
1000.0000 mg | ORAL_TABLET | Freq: Every day | ORAL | 1 refills | Status: DC
  Filled 2023-01-10 (×2): qty 60, 30d supply, fill #0
  Filled 2023-02-07: qty 60, 30d supply, fill #1

## 2023-01-10 MED ORDER — LOSARTAN POTASSIUM 25 MG PO TABS
25.0000 mg | ORAL_TABLET | Freq: Every day | ORAL | 0 refills | Status: DC
  Filled 2023-01-10: qty 30, 30d supply, fill #0

## 2023-01-24 ENCOUNTER — Other Ambulatory Visit (HOSPITAL_BASED_OUTPATIENT_CLINIC_OR_DEPARTMENT_OTHER): Payer: Self-pay

## 2023-01-24 ENCOUNTER — Other Ambulatory Visit (HOSPITAL_COMMUNITY): Payer: Self-pay

## 2023-01-26 ENCOUNTER — Encounter: Payer: Self-pay | Admitting: Gastroenterology

## 2023-01-28 ENCOUNTER — Other Ambulatory Visit (HOSPITAL_COMMUNITY): Payer: Self-pay

## 2023-01-31 ENCOUNTER — Other Ambulatory Visit (HOSPITAL_COMMUNITY): Payer: Self-pay

## 2023-02-01 ENCOUNTER — Other Ambulatory Visit: Payer: Self-pay

## 2023-02-01 ENCOUNTER — Other Ambulatory Visit (HOSPITAL_COMMUNITY): Payer: Self-pay

## 2023-02-07 ENCOUNTER — Other Ambulatory Visit (HOSPITAL_COMMUNITY): Payer: Self-pay

## 2023-02-07 ENCOUNTER — Other Ambulatory Visit: Payer: Self-pay | Admitting: Internal Medicine

## 2023-02-07 MED ORDER — LOSARTAN POTASSIUM 25 MG PO TABS
25.0000 mg | ORAL_TABLET | Freq: Every day | ORAL | 0 refills | Status: DC
  Filled 2023-02-07: qty 15, 15d supply, fill #0

## 2023-02-10 DIAGNOSIS — M65332 Trigger finger, left middle finger: Secondary | ICD-10-CM | POA: Diagnosis not present

## 2023-02-23 ENCOUNTER — Other Ambulatory Visit (HOSPITAL_COMMUNITY): Payer: Self-pay

## 2023-02-23 ENCOUNTER — Other Ambulatory Visit: Payer: Self-pay

## 2023-02-23 ENCOUNTER — Other Ambulatory Visit: Payer: Self-pay | Admitting: Internal Medicine

## 2023-02-23 MED ORDER — DAPAGLIFLOZIN PROPANEDIOL 5 MG PO TABS
5.0000 mg | ORAL_TABLET | Freq: Every day | ORAL | 3 refills | Status: DC
  Filled 2023-03-07: qty 30, 30d supply, fill #0

## 2023-02-23 MED ORDER — LOSARTAN POTASSIUM 25 MG PO TABS
25.0000 mg | ORAL_TABLET | Freq: Every day | ORAL | 0 refills | Status: DC
Start: 2023-02-23 — End: 2023-04-01
  Filled 2023-02-23: qty 15, 15d supply, fill #0

## 2023-02-25 ENCOUNTER — Encounter: Payer: Self-pay | Admitting: Gastroenterology

## 2023-02-28 ENCOUNTER — Other Ambulatory Visit (HOSPITAL_COMMUNITY): Payer: Self-pay

## 2023-03-01 ENCOUNTER — Encounter (HOSPITAL_COMMUNITY): Payer: Self-pay

## 2023-03-01 ENCOUNTER — Other Ambulatory Visit (HOSPITAL_COMMUNITY): Payer: Self-pay

## 2023-03-01 ENCOUNTER — Other Ambulatory Visit: Payer: Self-pay | Admitting: Internal Medicine

## 2023-03-03 ENCOUNTER — Other Ambulatory Visit (HOSPITAL_COMMUNITY): Payer: Self-pay

## 2023-03-07 ENCOUNTER — Other Ambulatory Visit: Payer: Self-pay

## 2023-03-07 ENCOUNTER — Other Ambulatory Visit (HOSPITAL_COMMUNITY): Payer: Self-pay

## 2023-03-07 ENCOUNTER — Other Ambulatory Visit (HOSPITAL_BASED_OUTPATIENT_CLINIC_OR_DEPARTMENT_OTHER): Payer: Self-pay

## 2023-03-08 ENCOUNTER — Other Ambulatory Visit: Payer: Self-pay

## 2023-03-22 ENCOUNTER — Encounter (HOSPITAL_COMMUNITY): Payer: Self-pay

## 2023-03-22 ENCOUNTER — Other Ambulatory Visit (HOSPITAL_COMMUNITY): Payer: Self-pay

## 2023-03-22 ENCOUNTER — Other Ambulatory Visit: Payer: Self-pay | Admitting: Internal Medicine

## 2023-03-23 ENCOUNTER — Other Ambulatory Visit (HOSPITAL_COMMUNITY): Payer: Self-pay

## 2023-03-25 ENCOUNTER — Other Ambulatory Visit (HOSPITAL_COMMUNITY): Payer: Self-pay

## 2023-03-25 ENCOUNTER — Telehealth: Payer: Self-pay | Admitting: Internal Medicine

## 2023-03-25 ENCOUNTER — Other Ambulatory Visit: Payer: Self-pay | Admitting: Orthopedic Surgery

## 2023-03-25 NOTE — Telephone Encounter (Signed)
Pt has been scheduled to see Eula Listen, PA-C, 04/01/23, clearance will be addressed at that time.  Will route to requesting surgeon's office to make them aware.

## 2023-03-25 NOTE — Telephone Encounter (Signed)
    Primary Cardiologist:Paula Tenny Craw, MD  Chart reviewed as part of pre-operative protocol coverage. Because of Stephen Arnold's past medical history and time since last visit, he/she will require a follow-up visit in order to better assess preoperative cardiovascular risk.  Pre-op covering staff: - Please schedule  Office appointment and call patient to inform them. - Please contact requesting surgeon's office via preferred method (i.e, phone, fax) to inform them of need for appointment prior to surgery.  If applicable, this message will also be routed to pharmacy pool and/or primary cardiologist for input on holding anticoagulant/antiplatelet agent as requested below so that this information is available at time of patient's appointment.   Ronney Asters, NP  03/25/2023, 12:51 PM

## 2023-03-25 NOTE — Telephone Encounter (Signed)
   Pre-operative Risk Assessment    Patient Name: Stephen Arnold  DOB: [birth year] MRN: [id number]      Request for Surgical Clearance    Procedure:   Left long finger revision trigger release  Date of Surgery:  Clearance 04/04/23                                 Surgeon:  Dr. Janee Morn Surgeon's Group or Practice Name: [facility]  Phone number:  [phone]  Fax number:  [phone]   Type of Clearance Requested:   - Medical  - Pharmacy:  Hold All medications      Type of Anesthesia:  MAC   Additional requests/questions:   Caller Darel Hong) stated patient will need medical and pharmacy clearance for all of his medications.  Signed, Annetta Maw   03/25/2023, 12:37 PM

## 2023-03-26 ENCOUNTER — Other Ambulatory Visit (HOSPITAL_COMMUNITY): Payer: Self-pay

## 2023-03-26 ENCOUNTER — Encounter (HOSPITAL_COMMUNITY): Payer: Self-pay

## 2023-03-28 ENCOUNTER — Other Ambulatory Visit: Payer: Self-pay

## 2023-03-28 ENCOUNTER — Encounter (HOSPITAL_BASED_OUTPATIENT_CLINIC_OR_DEPARTMENT_OTHER): Payer: Self-pay | Admitting: Orthopedic Surgery

## 2023-03-28 ENCOUNTER — Other Ambulatory Visit (HOSPITAL_COMMUNITY): Payer: Self-pay

## 2023-03-28 NOTE — Progress Notes (Signed)
   03/28/23 0854  Pre-op Phone Call  Surgery Date Verified 04/04/23  Arrival Time Verified 0600  Surgery Location Verified [facility]  Medical History Reviewed Yes  Is the patient taking a GLP-1 receptor agonist? No  Does the patient have diabetes? Type II  Does the patient use a Continuous Blood Glucose Monitor? Yes  Location of sensor? Right Arm  Is the patient on an insulin pump? No  Has the diabetes coordinator been notified? No  Do you have a history of heart problems? Yes  Cardiologist Name (S)  Dr. Dietrich Pates (apt on 04/07/23)  Have you ever had tests on your heart? Yes  What cardiac tests were performed? Cardiac Cath;EKG  Results viewable: [facility] Media Tab  Does patient have other implanted devices? No  Patient Teaching Enhanced Recovery;Pre / Post Procedure  Patient educated about smoking cessation 24 hours prior to surgery. N/A Non-Smoker  Patient verbalizes understanding of bowel prep? N/A  THA/TKA patients only:  By your surgery date, will you have been taking narcotics for 90 days or greater? No  Med Rec Completed Yes  Take the Following Meds the Morning of Surgery [place]  Recent  Lab Work, EKG, CXR? No  NPO (Including gum & candy) After midnight  Allowed clear liquids Water;Black Coffee Only (no creamer, milk or cream including half and half);Gatorade  (diabetics please choose diet or no sugar options)  Patient instructed to stop clear liquids including Carb loading drink at: 0430  Stop Solids, Milk, Candy, and Gum STARTING AT MIDNIGHT  Did patient view EMMI videos? No  Responsible adult to drive and be with you for 24 hours? Yes  Name & Phone Number for Ride/Caregiver Wife Selena Batten  No Jewelry, money, nail polish or make-up.  No lotions, powders, perfumes. No shaving  48 hrs. prior to surgery. Yes  Contacts, Dentures & Glasses Will Have to be Removed Before OR. Yes  Please bring your ID and Insurance Card the morning of your surgery. (Surgery Centers Only) Yes  Bring any papers  or x-rays with you that your surgeon gave you. Yes  Instructed to contact the location of procedure/ provider if they or anyone in their household develops symptoms or tests positive for COVID-19, has close contact with someone who tests positive for COVID, or has known exposure to any contagious illness. Yes  Call this number the morning of surgery  with any problems that may cancel your surgery. [phone]  Covid-19 Assessment  Have you had a positive COVID-19 test within the previous 90 days? No  COVID Testing Guidance Proceed with the additional questions.  Patient's surgery required a COVID-19 test (cardiothoracic, complex ENT, and bronchoscopies/ EBUS) No  Have you been unmasked and in close contact with anyone with COVID-19 or COVID-19 symptoms within the past 10 days? No  Do you or anyone in your household currently have any COVID-19 symptoms? No

## 2023-03-29 ENCOUNTER — Other Ambulatory Visit (HOSPITAL_BASED_OUTPATIENT_CLINIC_OR_DEPARTMENT_OTHER): Payer: Self-pay

## 2023-03-29 ENCOUNTER — Other Ambulatory Visit (HOSPITAL_COMMUNITY): Payer: Self-pay

## 2023-03-29 MED ORDER — TRESIBA FLEXTOUCH 100 UNIT/ML ~~LOC~~ SOPN
PEN_INJECTOR | SUBCUTANEOUS | 3 refills | Status: DC
  Filled 2023-03-29: qty 15, 25d supply, fill #0

## 2023-03-29 MED ORDER — TRESIBA FLEXTOUCH 100 UNIT/ML ~~LOC~~ SOPN
PEN_INJECTOR | SUBCUTANEOUS | 0 refills | Status: DC
  Filled 2023-03-29: qty 15, 25d supply, fill #0

## 2023-03-29 MED ORDER — TRESIBA FLEXTOUCH 100 UNIT/ML ~~LOC~~ SOPN
PEN_INJECTOR | SUBCUTANEOUS | 0 refills | Status: DC
  Filled 2023-03-29 (×2): qty 18, 30d supply, fill #0

## 2023-03-31 ENCOUNTER — Encounter (HOSPITAL_BASED_OUTPATIENT_CLINIC_OR_DEPARTMENT_OTHER)
Admission: RE | Admit: 2023-03-31 | Discharge: 2023-03-31 | Disposition: A | Discharge status: Home / Self Care | Payer: 59 | Source: Ambulatory Visit | Attending: Orthopedic Surgery | Admitting: Orthopedic Surgery

## 2023-03-31 ENCOUNTER — Other Ambulatory Visit: Payer: Self-pay

## 2023-03-31 DIAGNOSIS — Z01818 Encounter for other preprocedural examination: Secondary | ICD-10-CM | POA: Insufficient documentation

## 2023-03-31 DIAGNOSIS — Z01810 Encounter for preprocedural cardiovascular examination: Secondary | ICD-10-CM | POA: Diagnosis present

## 2023-03-31 DIAGNOSIS — Z01812 Encounter for preprocedural laboratory examination: Secondary | ICD-10-CM | POA: Diagnosis present

## 2023-03-31 LAB — BASIC METABOLIC PANEL
Anion gap: 10 (ref 5–15)
BUN: 18 mg/dL (ref 6–20)
CO2: 24 mmol/L (ref 22–32)
Calcium: 9.1 mg/dL (ref 8.9–10.3)
Chloride: 103 mmol/L (ref 98–111)
Creatinine, Ser: 1.09 mg/dL (ref 0.61–1.24)
GFR, Estimated: >60 mL/min (ref >60)
Glucose, Bld: 316 mg/dL — ABNORMAL HIGH (ref 70–99)
Potassium: 4.3 mmol/L (ref 3.5–5.1)
Sodium: 137 mmol/L (ref 135–145)

## 2023-03-31 NOTE — Progress Notes (Signed)
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
## 2023-04-01 ENCOUNTER — Encounter: Payer: Self-pay | Admitting: Physician Assistant

## 2023-04-01 ENCOUNTER — Other Ambulatory Visit (HOSPITAL_COMMUNITY): Payer: Self-pay

## 2023-04-01 ENCOUNTER — Ambulatory Visit: Payer: 59 | Attending: Physician Assistant | Admitting: Physician Assistant

## 2023-04-01 VITALS — BP 148/107 | HR 88 | Ht 72.0 in | Wt 241.2 lb

## 2023-04-01 DIAGNOSIS — Z789 Other specified health status: Secondary | ICD-10-CM | POA: Diagnosis not present

## 2023-04-01 DIAGNOSIS — I2510 Atherosclerotic heart disease of native coronary artery without angina pectoris: Secondary | ICD-10-CM | POA: Diagnosis not present

## 2023-04-01 DIAGNOSIS — E785 Hyperlipidemia, unspecified: Secondary | ICD-10-CM | POA: Diagnosis not present

## 2023-04-01 DIAGNOSIS — I10 Essential (primary) hypertension: Secondary | ICD-10-CM

## 2023-04-01 DIAGNOSIS — Z01810 Encounter for preprocedural cardiovascular examination: Secondary | ICD-10-CM | POA: Diagnosis not present

## 2023-04-01 MED ORDER — LOSARTAN POTASSIUM 50 MG PO TABS
50.0000 mg | ORAL_TABLET | Freq: Every day | ORAL | 6 refills | Status: DC
  Filled 2023-04-01: qty 30, 30d supply, fill #0
  Filled 2023-04-07 – 2023-04-27 (×2): qty 30, 30d supply, fill #1
  Filled 2023-06-27 – 2023-11-02 (×2): qty 30, 30d supply, fill #2
  Filled 2023-12-16 – 2024-01-18 (×2): qty 30, 30d supply, fill #3
  Filled 2024-03-20: qty 30, 30d supply, fill #4

## 2023-04-01 NOTE — Patient Instructions (Addendum)
Medication Instructions:  Your physician recommends the following medication changes.  RESTART: Losartan 50 mg by mouth daily  *If you need a refill on your cardiac medications before your next appointment, please call your pharmacy*   Lab Work: No labs ordered today    Testing/Procedures: No test ordered today    Follow-Up: At [facility], you and your health needs are our priority.  As part of our continuing mission to provide you with exceptional heart care, we have created designated Provider Care Teams.  These Care Teams include your primary Cardiologist (physician) and Advanced Practice Providers (APPs -  Physician Assistants and Nurse Practitioners) who all work together to provide you with the care you need, when you need it.  We recommend signing up for the patient portal called "MyChart".  Sign up information is provided on this After Visit Summary.  MyChart is used to connect with patients for Virtual Visits (Telemedicine).  Patients are able to view lab/test results, encounter notes, upcoming appointments, etc.  Non-urgent messages can be sent to your provider as well.   To learn more about what you can do with MyChart, go to [email or web address].    Your next appointment:   As planned  Provider:   Dietrich Pates, MD

## 2023-04-01 NOTE — Progress Notes (Signed)
glucose-316, will recheck day of surgery per Dr. Aleene Davidson.

## 2023-04-01 NOTE — H&P (Signed)
History: CC / Reason for Visit: Left hand evaluation HPI: This patient presents for reevaluation.  He reports that the steroid injection provided on 07-27-22 had no real benefit.  The long finger continues to lock and catching is painful and he cannot hyperextend the digit.  In fact is difficult to extend even to neutral.  HPI 07-27-22: This patient returns to clinic today for reevaluation of his left hand and long finger.  He states that it still actively popping which is painful.  He also notes that he does continue to take Celebrex daily.  He reports that the pain radiates throughout his flexor of his long finger.  He no longer catches in a flexed position, but when it does click its painful.  He states that he has been working on stretching.  He has not attended any formalized therapy.    HPI 06/15/22:This patient is a 57 year old he has had multiple trigger digits addressed surgically over the years with Dr. Jerl Santos.  The most recent was the left long finger trigger release 2.5 months ago.  He reports that if he holds all of his fingers extended then tries to flex the index that he feels a different type of triggering occurring in the long finger, nearer to the level of the PIP joint.  Aside from that he has some slight flexion and extension contracture of the digit.  Review of systems as related to current complaint reviewed and unchanged.  Exam:  Vitals: Refer to EMR. Constitutional:  WD, WN, NAD HEENT:  NCAT, EOMI Neuro/Psych:  Alert & oriented to person, place, and time; appropriate mood & affect Lymphatic: No generalized UE edema or lymphadenopathy Extremities / MSK:  Both UE are normal with respect to appearance, ranges of motion, joint stability, muscle strength/tone, sensation, & perfusion except as otherwise noted:  The skin is now soft and supple.  With MP flexion, PIP achieves full extension, but with composite extension there is a slight flexed curve to the finger.  There is some  tenderness to palpation at the base of the digit with overt locking detected with each flexion cycle  Labs / Xrays:  No radiographic studies obtained today.  Assessment: 11 months following left long finger trigger release, with some ongoing painful locking  Plan:  I discussed these findings with him.  I recommended exploration with revision release.  He would like to proceed as well, likely in December.  We will work to schedule this accordingly.  We discussed incision placement, etc.    The details of the operative procedure were discussed with the patient.  Questions were invited and answered.  The goal of the procedure was reviewed.  The risks of the procedure includes but is not limited to bleeding; infection; damage to the nerves or blood vessels that could result in bleeding, numbness, weakness, chronic pain, and the need for additional procedures; stiffness; the need for revision surgery; and anesthetic risks, including death. No specific outcome was guaranteed or implied.  Informed consent was obtained.

## 2023-04-04 ENCOUNTER — Ambulatory Visit (HOSPITAL_BASED_OUTPATIENT_CLINIC_OR_DEPARTMENT_OTHER): Payer: 59 | Admitting: Anesthesiology

## 2023-04-04 ENCOUNTER — Encounter (HOSPITAL_BASED_OUTPATIENT_CLINIC_OR_DEPARTMENT_OTHER): Payer: Self-pay | Admitting: Orthopedic Surgery

## 2023-04-04 ENCOUNTER — Ambulatory Visit (HOSPITAL_BASED_OUTPATIENT_CLINIC_OR_DEPARTMENT_OTHER)
Admission: RE | Admit: 2023-04-04 | Discharge: 2023-04-04 | Disposition: A | Discharge status: Home / Self Care | Payer: 59 | Attending: Orthopedic Surgery | Admitting: Orthopedic Surgery

## 2023-04-04 ENCOUNTER — Other Ambulatory Visit (HOSPITAL_COMMUNITY): Payer: Self-pay

## 2023-04-04 ENCOUNTER — Other Ambulatory Visit: Payer: Self-pay

## 2023-04-04 ENCOUNTER — Encounter (HOSPITAL_BASED_OUTPATIENT_CLINIC_OR_DEPARTMENT_OTHER)
Admission: RE | Disposition: A | Discharge status: Home / Self Care | Payer: Self-pay | Source: Home / Self Care | Attending: Orthopedic Surgery

## 2023-04-04 DIAGNOSIS — Z955 Presence of coronary angioplasty implant and graft: Secondary | ICD-10-CM | POA: Diagnosis not present

## 2023-04-04 DIAGNOSIS — I10 Essential (primary) hypertension: Secondary | ICD-10-CM | POA: Diagnosis not present

## 2023-04-04 DIAGNOSIS — M65842 Other synovitis and tenosynovitis, left hand: Secondary | ICD-10-CM | POA: Insufficient documentation

## 2023-04-04 DIAGNOSIS — I2510 Atherosclerotic heart disease of native coronary artery without angina pectoris: Secondary | ICD-10-CM | POA: Insufficient documentation

## 2023-04-04 DIAGNOSIS — E119 Type 2 diabetes mellitus without complications: Secondary | ICD-10-CM | POA: Diagnosis not present

## 2023-04-04 DIAGNOSIS — M65332 Trigger finger, left middle finger: Secondary | ICD-10-CM | POA: Diagnosis not present

## 2023-04-04 DIAGNOSIS — G4730 Sleep apnea, unspecified: Secondary | ICD-10-CM | POA: Diagnosis not present

## 2023-04-04 DIAGNOSIS — Z01818 Encounter for other preprocedural examination: Secondary | ICD-10-CM

## 2023-04-04 DIAGNOSIS — M1990 Unspecified osteoarthritis, unspecified site: Secondary | ICD-10-CM | POA: Insufficient documentation

## 2023-04-04 HISTORY — PX: TRIGGER FINGER RELEASE: SHX641

## 2023-04-04 LAB — GLUCOSE, CAPILLARY
Glucose-Capillary: 166 mg/dL — ABNORMAL HIGH (ref 70–99)
Glucose-Capillary: 165 mg/dL — ABNORMAL HIGH (ref 70–99)

## 2023-04-04 SURGERY — RELEASE, A1 PULLEY, FOR TRIGGER FINGER
Anesthesia: General | Site: Hand | Laterality: Left

## 2023-04-04 MED ORDER — MIDAZOLAM HCL 2 MG/2ML IJ SOLN
INTRAMUSCULAR | Status: AC
  Filled 2023-04-04: qty 2

## 2023-04-04 MED ORDER — 0.9 % SODIUM CHLORIDE (POUR BTL) OPTIME
TOPICAL | Status: DC | PRN
  Administered 2023-04-04: 100 mL

## 2023-04-04 MED ORDER — CEFAZOLIN SODIUM-DEXTROSE 2-4 GM/100ML-% IV SOLN
INTRAVENOUS | Status: AC
  Filled 2023-04-04: qty 100

## 2023-04-04 MED ORDER — ACETAMINOPHEN 325 MG PO TABS
650.0000 mg | ORAL_TABLET | Freq: Four times a day (QID) | ORAL | Status: AC
Start: 2023-04-04 — End: ?

## 2023-04-04 MED ORDER — PROPOFOL 10 MG/ML IV BOLUS
INTRAVENOUS | Status: DC | PRN
  Administered 2023-04-04: 180 mg via INTRAVENOUS
  Administered 2023-04-04: 20 mg via INTRAVENOUS
  Administered 2023-04-04: 50 mg via INTRAVENOUS

## 2023-04-04 MED ORDER — OXYCODONE HCL 5 MG PO TABS
5.0000 mg | ORAL_TABLET | Freq: Once | ORAL | Status: DC | PRN
Start: 2023-04-04 — End: 2023-04-04

## 2023-04-04 MED ORDER — SODIUM CHLORIDE 0.9 % IV SOLN: INTRAVENOUS | Status: DC | PRN

## 2023-04-04 MED ORDER — OXYCODONE HCL 5 MG PO TABS
5.0000 mg | ORAL_TABLET | Freq: Four times a day (QID) | ORAL | 0 refills | Status: AC | PRN
  Filled 2023-04-04: qty 12, 3d supply, fill #0

## 2023-04-04 MED ORDER — ACETAMINOPHEN 500 MG PO TABS: 1000.0000 mg | ORAL_TABLET | Freq: Once | ORAL | Status: DC | PRN

## 2023-04-04 MED ORDER — DEXAMETHASONE SODIUM PHOSPHATE 10 MG/ML IJ SOLN
INTRAMUSCULAR | Status: DC | PRN
  Administered 2023-04-04: 4 mg via INTRAVENOUS

## 2023-04-04 MED ORDER — ACETAMINOPHEN 10 MG/ML IV SOLN: 1000.0000 mg | Freq: Once | INTRAVENOUS | Status: DC | PRN

## 2023-04-04 MED ORDER — ONDANSETRON HCL 4 MG/2ML IJ SOLN
INTRAMUSCULAR | Status: AC
  Filled 2023-04-04: qty 2

## 2023-04-04 MED ORDER — PROPOFOL 500 MG/50ML IV EMUL
INTRAVENOUS | Status: AC
  Filled 2023-04-04: qty 50

## 2023-04-04 MED ORDER — LACTATED RINGERS IV SOLN: INTRAVENOUS | Status: DC

## 2023-04-04 MED ORDER — ACETAMINOPHEN 160 MG/5ML PO SOLN: 1000.0000 mg | Freq: Once | ORAL | Status: DC | PRN

## 2023-04-04 MED ORDER — LIDOCAINE HCL (CARDIAC) PF 100 MG/5ML IV SOSY
PREFILLED_SYRINGE | INTRAVENOUS | Status: DC | PRN
  Administered 2023-04-04: 60 mg via INTRAVENOUS

## 2023-04-04 MED ORDER — BUPIVACAINE-EPINEPHRINE (PF) 0.5% -1:200000 IJ SOLN
INTRAMUSCULAR | Status: AC
  Filled 2023-04-04: qty 30

## 2023-04-04 MED ORDER — LIDOCAINE HCL 2 % IJ SOLN
INTRAMUSCULAR | Status: AC
Start: 2023-04-04 — End: ?
  Filled 2023-04-04: qty 20

## 2023-04-04 MED ORDER — OXYCODONE HCL 5 MG/5ML PO SOLN: 5.0000 mg | Freq: Once | ORAL | Status: DC | PRN

## 2023-04-04 MED ORDER — FENTANYL CITRATE (PF) 100 MCG/2ML IJ SOLN
INTRAMUSCULAR | Status: DC | PRN
  Administered 2023-04-04: 50 ug via INTRAVENOUS
  Administered 2023-04-04 (×2): 25 ug via INTRAVENOUS

## 2023-04-04 MED ORDER — SODIUM BICARBONATE 4.2 % IV SOLN
INTRAVENOUS | Status: AC
  Filled 2023-04-04: qty 10

## 2023-04-04 MED ORDER — ONDANSETRON HCL 4 MG/2ML IJ SOLN
INTRAMUSCULAR | Status: DC | PRN
  Administered 2023-04-04: 4 mg via INTRAVENOUS

## 2023-04-04 MED ORDER — MIDAZOLAM HCL 5 MG/5ML IJ SOLN
INTRAMUSCULAR | Status: DC | PRN
  Administered 2023-04-04: 2 mg via INTRAVENOUS

## 2023-04-04 MED ORDER — CEFAZOLIN SODIUM-DEXTROSE 2-4 GM/100ML-% IV SOLN
2.0000 g | INTRAVENOUS | Status: AC
  Administered 2023-04-04: 2 g via INTRAVENOUS

## 2023-04-04 MED ORDER — FENTANYL CITRATE (PF) 100 MCG/2ML IJ SOLN: 25.0000 ug | INTRAMUSCULAR | Status: DC | PRN

## 2023-04-04 MED ORDER — LIDOCAINE HCL 2 % IJ SOLN
INTRAMUSCULAR | Status: DC | PRN
  Administered 2023-04-04: 5 mL

## 2023-04-04 MED ORDER — BUPIVACAINE-EPINEPHRINE 0.5% -1:200000 IJ SOLN
INTRAMUSCULAR | Status: DC | PRN
  Administered 2023-04-04: 5 mL

## 2023-04-04 MED ORDER — FENTANYL CITRATE (PF) 100 MCG/2ML IJ SOLN
INTRAMUSCULAR | Status: AC
  Filled 2023-04-04: qty 2

## 2023-04-04 MED ORDER — PROPOFOL 10 MG/ML IV BOLUS
INTRAVENOUS | Status: AC
  Filled 2023-04-04: qty 20

## 2023-04-04 SURGICAL SUPPLY — 29 items
BLADE SURG 15 STRL LF DISP TIS (BLADE) ×1 IMPLANT
BNDG COHESIVE 1X5 TAN STRL LF (GAUZE/BANDAGES/DRESSINGS) ×1 IMPLANT
BNDG ESMARK 4X9 LF (GAUZE/BANDAGES/DRESSINGS) ×1 IMPLANT
CHLORAPREP W/TINT 26 (MISCELLANEOUS) ×1 IMPLANT
COVER BACK TABLE 60X90IN (DRAPES) ×1 IMPLANT
COVER MAYO STAND STRL (DRAPES) ×1 IMPLANT
CUFF TOURN SGL QUICK 18X4 (TOURNIQUET CUFF) IMPLANT
DRAPE EXTREMITY T 121X128X90 (DISPOSABLE) ×1 IMPLANT
DRAPE SURG 17X23 STRL (DRAPES) ×1 IMPLANT
DRSG EMULSION OIL 3X3 NADH (GAUZE/BANDAGES/DRESSINGS) ×1 IMPLANT
GAUZE SPONGE 4X4 12PLY STRL LF (GAUZE/BANDAGES/DRESSINGS) ×1 IMPLANT
GAUZE STRETCH 2X75IN STRL (MISCELLANEOUS) ×1 IMPLANT
GLOVE BIO SURGEON STRL SZ7.5 (GLOVE) ×1 IMPLANT
GLOVE BIOGEL PI IND STRL 7.0 (GLOVE) ×1 IMPLANT
GLOVE BIOGEL PI IND STRL 8 (GLOVE) ×1 IMPLANT
GLOVE ECLIPSE 6.5 STRL STRAW (GLOVE) ×1 IMPLANT
GOWN STRL REUS W/ TWL LRG LVL3 (GOWN DISPOSABLE) ×2 IMPLANT
GOWN STRL REUS W/TWL XL LVL3 (GOWN DISPOSABLE) ×1 IMPLANT
NDL HYPO 25X1 1.5 SAFETY (NEEDLE) IMPLANT
NDL SAFETY ECLIPSE 18X1.5 (NEEDLE) IMPLANT
NEEDLE HYPO 25X1 1.5 SAFETY (NEEDLE)
NS IRRIG 1000ML POUR BTL (IV SOLUTION) ×1 IMPLANT
PACK BASIN DAY SURGERY FS (CUSTOM PROCEDURE TRAY) ×1 IMPLANT
STOCKINETTE 6 STRL (DRAPES) ×1 IMPLANT
SUT VICRYL RAPIDE 4/0 PS 2 (SUTURE) IMPLANT
SYR 10ML LL (SYRINGE) IMPLANT
SYR BULB EAR ULCER 3OZ GRN STR (SYRINGE) ×1 IMPLANT
TOWEL GREEN STERILE FF (TOWEL DISPOSABLE) ×1 IMPLANT
UNDERPAD 30X36 HEAVY ABSORB (UNDERPADS AND DIAPERS) ×1 IMPLANT

## 2023-04-04 NOTE — Transfer of Care (Signed)
Immediate Anesthesia Transfer of Care Note  Patient: Stephen Arnold  Procedure(s) Performed: LEFT LONG FINGER REVISION TRIGGER RELEASE (Left: Hand)  Patient Location: PACU  Anesthesia Type:General  Level of Consciousness: drowsy, patient cooperative, and responds to stimulation  Airway & Oxygen Therapy: Patient Spontanous Breathing and Patient connected to face mask oxygen  Post-op Assessment: Report given to RN and Post -op Vital signs reviewed and stable  Post vital signs: Reviewed and stable  Last Vitals:  Vitals Value Taken Time  BP    Temp    Pulse 77 04/04/23 0817  Resp 14 04/04/23 0817  SpO2 99 % 04/04/23 0817  Vitals shown include unfiled device data.  Last Pain:  Vitals:   04/04/23 0646  TempSrc: Temporal  PainSc: 3       Patients Stated Pain Goal: 4 (04/04/23 0646)  Complications: No notable events documented.

## 2023-04-04 NOTE — Anesthesia Procedure Notes (Signed)
Procedure Name: LMA Insertion Date/Time: 04/04/2023 7:37 AM  Performed by: Thornell Mule, CRNAPre-anesthesia Checklist: Patient identified, Emergency Drugs available, Suction available and Patient being monitored Patient Re-evaluated:Patient Re-evaluated prior to induction Oxygen Delivery Method: Circle system utilized Preoxygenation: Pre-oxygenation with 100% oxygen Induction Type: IV induction Ventilation: Mask ventilation without difficulty LMA: LMA inserted LMA Size: 5.0 Number of attempts: 1 Placement Confirmation: positive ETCO2 Tube secured with: Tape Dental Injury: Teeth and Oropharynx as per pre-operative assessment

## 2023-04-04 NOTE — Discharge Instructions (Addendum)
Discharge Instructions   You have a light dressing on your hand.  You may begin gentle motion of your fingers and hand immediately, but you should not do any heavy lifting or gripping.  Elevate your hand to reduce pain & swelling of the digits.  Ice over the operative site may be helpful to reduce pain & swelling.  DO NOT USE HEAT. Pain medicine has been prescribed for you.  Take Tylenol 650 mg and Ibuprofen 600mg  every 6 hours together. Take the prescribed pain medicine as a rescue medicine for severe post operative pain management. Leave the dressing in place until the third day after your surgery and then remove it, leaving it open to air.  After the bandage has been removed you may shower, regularly washing the incision and letting the water run over it, but not submerging it (no swimming, soaking it in dishwater, etc.) You may drive a car when you are off of prescription pain medications and can safely control your vehicle with both hands. We will address whether therapy will be required or not when you return to the office. You will be contacted by our office to arrange follow up.   Please call [phone] during normal business hours or [phone] after hours for any problems. Including the following:  - excessive redness of the incisions - drainage for more than 4 days - fever of more than 101.5 F  *Please note that pain medications will not be refilled after hours or on weekends.   Patient is out of work today 04/04/23. He may return to work tomorrow, 04/05/23 with no lifting, gripping or grasping greater than pencil and paper tasks until first post operative appointment.   Post Anesthesia Home Care Instructions  Activity: Get plenty of rest for the remainder of the day. A responsible individual must stay with you for 24 hours following the procedure.  For the next 24 hours, DO NOT: -Drive a car -Advertising copywriter -Drink alcoholic beverages -Take any medication unless  instructed by your physician -Make any legal decisions or sign important papers.  Meals: Start with liquid foods such as gelatin or soup. Progress to regular foods as tolerated. Avoid greasy, spicy, heavy foods. If nausea and/or vomiting occur, drink only clear liquids until the nausea and/or vomiting subsides. Call your physician if vomiting continues.  Special Instructions/Symptoms: Your throat may feel dry or sore from the anesthesia or the breathing tube placed in your throat during surgery. If this causes discomfort, gargle with warm salt water. The discomfort should disappear within 24 hours.  If you had a scopolamine patch placed behind your ear for the management of post- operative nausea and/or vomiting:  1. The medication in the patch is effective for 72 hours, after which it should be removed.  Wrap patch in a tissue and discard in the trash. Wash hands thoroughly with soap and water. 2. You may remove the patch earlier than 72 hours if you experience unpleasant side effects which may include dry mouth, dizziness or visual disturbances. 3. Avoid touching the patch. Wash your hands with soap and water after contact with the patch.

## 2023-04-04 NOTE — Interval H&P Note (Signed)
History and Physical Interval Note:  04/04/2023 7:28 AM  Stephen Arnold  has presented today for surgery, with the diagnosis of LEFT LONG FINGER STENOSING TENOSYNOVITIS.  The various methods of treatment have been discussed with the patient and family. After consideration of risks, benefits and other options for treatment, the patient has consented to  Procedure(s) with comments: LEFT LONG FINGER REVISION TRIGGER RELEASE (Left) - REQUEST 30 MINUTES as a surgical intervention.  The patient's history has been reviewed, patient examined, no change in status, stable for surgery.  I have reviewed the patient's chart and labs.  Questions were answered to the patient's satisfaction.     [name]

## 2023-04-04 NOTE — Op Note (Signed)
04/04/2023  7:28 AM  PATIENT:  Stephen Arnold  57 y.o. male  PRE-OPERATIVE DIAGNOSIS:  LEFT LONG FINGER STENOSING TENOSYNOVITIS  POST-OPERATIVE DIAGNOSIS:  Same  PROCEDURE:  Procedure(s): LEFT LONG FINGER REVISION TRIGGER RELEASE  SURGEON:  Surgeon(s): Mack Hook, MD  PHYSICIAN ASSISTANT: Danielle Rankin, OPA-C  ANESTHESIA: General  SPECIMENS:  None  DRAINS:   None  EBL: Less than 10 mL  PREOPERATIVE INDICATIONS:  [name] is a  57 y.o. male with a diagnosis of LEFT LONG FINGER STENOSING TENOSYNOVITIS who failed conservative measures and elected for surgical management.    The risks benefits and alternatives were discussed with the patient preoperatively including but not limited to the risks of infection, bleeding, nerve injury, cardiopulmonary complications, the need for revision surgery, among others, and the patient verbalized understanding and consented to proceed.  OPERATIVE IMPLANTS: None  OPERATIVE FINDINGS: See Below  OPERATIVE PROCEDURE:  After receiving prophylactic antibiotics, the patient was escorted to the operative theatre and placed in a supine position.   General anesthesia was provided by the anesthesia provider.  A surgical "time-out" was performed during which the planned procedure, proposed operative site, and the correct patient identity were compared to the operative consent and agreement confirmed by the circulating nurse according to current facility policy.  Digital block/median nerve block at the wrist was performed with a mixture of lidocaine and Marcaine, bearing epinephrine. Following application of a tourniquet to the operative extremity, the exposed skin was prepped with Chloraprep and draped in the usual sterile fashion.  The limb was exsanguinated with an Esmarch bandage and the tourniquet inflated to approximately higher than systolic BP.   An oblique incision was made over the A1 pulley of the affected digit.   Subcutaneous taste tissues were dissected with blunt and spreading dissection to reveal an underlying flexor tendon sheath and A1 pulley.  With the neurovascular structures protected, the A1 pulley was split in the midline under direct visualization with loupe assistance.  Some crossing bands proximal to the A1 pulley were also released.  The tendons were pulled into view, cleaned of thickened synovium and return to their bed.  The wound was irrigated and the tourniquet released.  He was awakened from general anesthesia so that he could participate by opening and closing the fingers on this hand.  He no longer could appreciate any catching or clicking, neither could I.  The skin incision was then closed with 4-0 Vicryl Rapide.  A light dressing was applied and the patient was taken to the recovery room.   DISPOSITION: The patient will be discharged home today with typical instructions, returning in 10-15 days.

## 2023-04-04 NOTE — Anesthesia Preprocedure Evaluation (Addendum)
Anesthesia Evaluation  Patient identified by MRN, date of birth, ID band Patient awake    Reviewed: Allergy & Precautions, NPO status , Patient's Chart, lab work & pertinent test results  History of Anesthesia Complications Negative for: history of anesthetic complications  Airway Mallampati: II  TM Distance: >3 FB Neck ROM: Full    Dental  (+) Teeth Intact, Dental Advisory Given   Pulmonary sleep apnea    breath sounds clear to auscultation       Cardiovascular hypertension, Pt. on medications (-) angina + CAD and + Cardiac Stents   Rhythm:Regular   Mid RCA lesion, 60 %stenosed.  Ost LAD to Mid LAD lesion, 30 %stenosed.  Dist LAD-1 lesion, 40 %stenosed.  Dist LAD-2 lesion, 90 %stenosed.  1st Mrg lesion, 90 %stenosed.  2nd Mrg lesion, 40 %stenosed.  LPDA-1 lesion, 45 %stenosed.  LV end diastolic pressure is normal.  Mid LAD lesion, 90 %stenosed.  A STENT PROMUS PREM MR 3.0X16 drug eluting stent was successfully placed.  Post intervention, there is a 0% residual stenosis.  LPDA-2 lesion, 90 %stenosed.  A STENT PROMUS PREM MR 2.25X20 drug eluting stent was successfully placed.  Post intervention, there is a 0% residual stenosis.   1. Multivessel obstructive CAD    - 90% ulcerated mid LAD stenosis. 90% distal LAD stenosis at the apex    - 90% small OM 1    - 90% mid left PDA 2. Normal LVEDP 3. Successful stenting of the mid LAD with DES 4. Successful stenting of the mid left PDA with DES   Plan: DAPT for one year. Aggressive risk factor modification. Patient reports history of statin intolerance. Consider low dose Crestor 5 mg every other day. Consider adding Zetia or PCSK-9 inhibitor. Add beta blocker therapy. Would treat residual disease medically.     Neuro/Psych negative neurological ROS  negative psych ROS   GI/Hepatic negative GI ROS, Neg liver ROS,,,  Endo/Other  diabetes, Type 2, Insulin  Dependent  Lab Results      Component                Value               Date                      HGBA1C                   7.5 (H)             07/14/2021             Renal/GU Renal diseaseLab Results      Component                Value               Date                      NA                       137                 03/31/2023                K                        4.3  03/31/2023                CO2                      24                  03/31/2023                GLUCOSE                  316 (H)             03/31/2023                BUN                      18                  03/31/2023                CREATININE               1.09                03/31/2023                CALCIUM                  9.1                 03/31/2023                GFRNONAA                 >60                 03/31/2023                Musculoskeletal  (+) Arthritis ,    Abdominal   Peds  Hematology  (+) Blood dyscrasia, anemia Lab Results      Component                Value               Date                      WBC                      15.4 (H)            07/15/2021                HGB                      11.5 (L)            07/15/2021                HCT                      35.7 (L)            07/15/2021                MCV                      78.8 (L)            07/15/2021  PLT                      313                 07/15/2021              Anesthesia Other Findings   Reproductive/Obstetrics                             Anesthesia Physical Anesthesia Plan  ASA: 3  Anesthesia Plan: General   Post-op Pain Management: Minimal or no pain anticipated   Induction: Intravenous  PONV Risk Score and Plan: 2 and Ondansetron  Airway Management Planned: LMA  Additional Equipment: None  Intra-op Plan:   Post-operative Plan: Extubation in OR  Informed Consent: I have reviewed the patients History and Physical, chart, labs and  discussed the procedure including the risks, benefits and alternatives for the proposed anesthesia with the patient or authorized representative who has indicated his/her understanding and acceptance.     Dental advisory given  Plan Discussed with: CRNA  Anesthesia Plan Comments:         Anesthesia Quick Evaluation

## 2023-04-04 NOTE — Anesthesia Postprocedure Evaluation (Signed)
Anesthesia Post Note  Patient: SOLON BELLMAN  Procedure(s) Performed: LEFT LONG FINGER REVISION TRIGGER RELEASE (Left: Hand)     Patient location during evaluation: PACU Anesthesia Type: General Level of consciousness: awake and alert Pain management: pain level controlled Vital Signs Assessment: post-procedure vital signs reviewed and stable Respiratory status: spontaneous breathing, nonlabored ventilation and respiratory function stable Cardiovascular status: blood pressure returned to baseline and stable Postop Assessment: no apparent nausea or vomiting Anesthetic complications: no   No notable events documented.  Last Vitals:  Vitals:   04/04/23 0830 04/04/23 0844  BP: 136/83 (!) 145/82  Pulse: 70 62  Resp: 11 14  Temp:  37.2 C  SpO2: 98% 96%    Last Pain:  Vitals:   04/04/23 0844  TempSrc: Temporal  PainSc: 0-No pain                 Barney Russomanno

## 2023-04-05 ENCOUNTER — Encounter (HOSPITAL_BASED_OUTPATIENT_CLINIC_OR_DEPARTMENT_OTHER): Payer: Self-pay | Admitting: Orthopedic Surgery

## 2023-04-05 ENCOUNTER — Other Ambulatory Visit (HOSPITAL_COMMUNITY): Payer: Self-pay

## 2023-04-05 MED ORDER — PROMETHAZINE HCL 25 MG PO TABS
25.0000 mg | ORAL_TABLET | Freq: Four times a day (QID) | ORAL | 0 refills | Status: AC | PRN
  Filled 2023-04-05: qty 12, 3d supply, fill #0

## 2023-04-07 ENCOUNTER — Other Ambulatory Visit (HOSPITAL_COMMUNITY): Payer: Self-pay

## 2023-04-07 MED ORDER — METFORMIN HCL ER 500 MG PO TB24
1000.0000 mg | ORAL_TABLET | Freq: Every day | ORAL | 1 refills | Status: DC
  Filled 2023-04-07: qty 60, 30d supply, fill #0
  Filled 2023-06-27 (×2): qty 60, 30d supply, fill #1

## 2023-04-07 MED ORDER — INSULIN PEN NEEDLE 32G X 4 MM MISC
1.0000 | Freq: Every day | 0 refills | Status: DC
  Filled 2023-04-07 – 2023-04-15 (×2): qty 200, 28d supply, fill #0

## 2023-04-07 MED ORDER — TRESIBA FLEXTOUCH 100 UNIT/ML ~~LOC~~ SOPN
PEN_INJECTOR | SUBCUTANEOUS | 0 refills | Status: DC
  Filled 2023-04-07 – 2023-04-27 (×3): qty 18, 30d supply, fill #0

## 2023-04-07 MED ORDER — INSULIN LISPRO (1 UNIT DIAL) 100 UNIT/ML (KWIKPEN)
12.0000 [IU] | PEN_INJECTOR | Freq: Three times a day (TID) | SUBCUTANEOUS | 0 refills | Status: DC
  Filled 2023-04-07: qty 15, 34d supply, fill #0

## 2023-04-08 ENCOUNTER — Other Ambulatory Visit (HOSPITAL_COMMUNITY): Payer: Self-pay

## 2023-04-08 ENCOUNTER — Other Ambulatory Visit: Payer: Self-pay

## 2023-04-08 MED ORDER — INSULIN LISPRO (1 UNIT DIAL) 100 UNIT/ML (KWIKPEN)
12.0000 [IU] | PEN_INJECTOR | Freq: Three times a day (TID) | SUBCUTANEOUS | 0 refills | Status: DC
  Filled 2023-04-08 – 2023-06-27 (×3): qty 15, 34d supply, fill #0

## 2023-04-08 MED ORDER — TRESIBA FLEXTOUCH 100 UNIT/ML ~~LOC~~ SOPN
PEN_INJECTOR | SUBCUTANEOUS | 0 refills | Status: DC
  Filled 2023-11-02: qty 18, 30d supply, fill #0

## 2023-04-08 MED ORDER — METFORMIN HCL ER 500 MG PO TB24
1000.0000 mg | ORAL_TABLET | Freq: Every day | ORAL | 1 refills | Status: DC
  Filled 2023-04-08 – 2023-09-05 (×2): qty 60, 30d supply, fill #0
  Filled 2023-10-04: qty 60, 30d supply, fill #1

## 2023-04-08 MED ORDER — DEXCOM G7 SENSOR MISC
1.0000 | 3 refills | Status: AC
  Filled 2023-04-08 – 2023-10-04 (×2): qty 3, 30d supply, fill #0
  Filled 2023-11-02 – 2024-01-18 (×2): qty 3, 30d supply, fill #1
  Filled 2024-03-20: qty 3, 30d supply, fill #2

## 2023-04-11 ENCOUNTER — Ambulatory Visit: Payer: 59 | Admitting: Internal Medicine

## 2023-04-11 DIAGNOSIS — E113293 Type 2 diabetes mellitus with mild nonproliferative diabetic retinopathy without macular edema, bilateral: Secondary | ICD-10-CM | POA: Diagnosis not present

## 2023-04-15 ENCOUNTER — Other Ambulatory Visit (HOSPITAL_COMMUNITY): Payer: Self-pay

## 2023-04-18 ENCOUNTER — Other Ambulatory Visit: Payer: Self-pay

## 2023-04-18 ENCOUNTER — Other Ambulatory Visit (HOSPITAL_COMMUNITY): Payer: Self-pay

## 2023-04-27 ENCOUNTER — Other Ambulatory Visit (HOSPITAL_COMMUNITY): Payer: Self-pay

## 2023-04-28 ENCOUNTER — Other Ambulatory Visit (HOSPITAL_COMMUNITY): Payer: Self-pay

## 2023-04-29 ENCOUNTER — Other Ambulatory Visit (HOSPITAL_COMMUNITY): Payer: Self-pay

## 2023-05-18 ENCOUNTER — Other Ambulatory Visit (HOSPITAL_COMMUNITY): Payer: Self-pay

## 2023-05-19 ENCOUNTER — Other Ambulatory Visit (HOSPITAL_COMMUNITY): Payer: Self-pay

## 2023-05-19 DIAGNOSIS — I10 Essential (primary) hypertension: Secondary | ICD-10-CM | POA: Diagnosis not present

## 2023-05-19 DIAGNOSIS — E785 Hyperlipidemia, unspecified: Secondary | ICD-10-CM | POA: Diagnosis not present

## 2023-05-19 DIAGNOSIS — E1065 Type 1 diabetes mellitus with hyperglycemia: Secondary | ICD-10-CM | POA: Diagnosis not present

## 2023-05-20 ENCOUNTER — Other Ambulatory Visit: Payer: Self-pay

## 2023-05-20 ENCOUNTER — Other Ambulatory Visit (HOSPITAL_COMMUNITY): Payer: Self-pay

## 2023-05-20 MED ORDER — INSULIN LISPRO (1 UNIT DIAL) 100 UNIT/ML (KWIKPEN)
12.0000 [IU] | PEN_INJECTOR | Freq: Three times a day (TID) | SUBCUTANEOUS | 0 refills | Status: DC
  Filled 2023-05-20: qty 15, 34d supply, fill #0

## 2023-05-26 ENCOUNTER — Other Ambulatory Visit (HOSPITAL_BASED_OUTPATIENT_CLINIC_OR_DEPARTMENT_OTHER): Payer: Self-pay

## 2023-05-27 DIAGNOSIS — Z955 Presence of coronary angioplasty implant and graft: Secondary | ICD-10-CM | POA: Diagnosis not present

## 2023-05-27 DIAGNOSIS — E1065 Type 1 diabetes mellitus with hyperglycemia: Secondary | ICD-10-CM | POA: Diagnosis not present

## 2023-05-27 DIAGNOSIS — E785 Hyperlipidemia, unspecified: Secondary | ICD-10-CM | POA: Diagnosis not present

## 2023-05-27 DIAGNOSIS — I10 Essential (primary) hypertension: Secondary | ICD-10-CM | POA: Diagnosis not present

## 2023-05-31 ENCOUNTER — Other Ambulatory Visit (HOSPITAL_COMMUNITY): Payer: Self-pay

## 2023-05-31 MED ORDER — TRESIBA FLEXTOUCH 100 UNIT/ML ~~LOC~~ SOPN
PEN_INJECTOR | SUBCUTANEOUS | 0 refills | Status: DC
Start: 2023-05-31 — End: 2023-06-27
  Filled 2023-05-31: qty 18, 30d supply, fill #0

## 2023-06-27 ENCOUNTER — Other Ambulatory Visit (HOSPITAL_COMMUNITY): Payer: Self-pay

## 2023-06-27 ENCOUNTER — Other Ambulatory Visit: Payer: Self-pay

## 2023-06-27 MED ORDER — TRESIBA FLEXTOUCH 100 UNIT/ML ~~LOC~~ SOPN
PEN_INJECTOR | SUBCUTANEOUS | 3 refills | Status: DC
  Filled 2023-06-27: qty 18, 30d supply, fill #0
  Filled 2023-08-04: qty 18, 30d supply, fill #1
  Filled 2023-09-05: qty 18, 30d supply, fill #2
  Filled 2023-10-04: qty 18, 30d supply, fill #3

## 2023-07-19 ENCOUNTER — Other Ambulatory Visit (HOSPITAL_COMMUNITY): Payer: Self-pay

## 2023-07-19 MED ORDER — METFORMIN HCL ER 500 MG PO TB24
1000.0000 mg | ORAL_TABLET | Freq: Every day | ORAL | 1 refills | Status: AC
Start: 2023-07-19 — End: ?
  Filled 2023-07-19 – 2023-11-02 (×2): qty 60, 30d supply, fill #0
  Filled 2024-02-17: qty 60, 30d supply, fill #1

## 2023-07-20 ENCOUNTER — Other Ambulatory Visit (HOSPITAL_COMMUNITY): Payer: Self-pay

## 2023-08-04 ENCOUNTER — Other Ambulatory Visit (HOSPITAL_COMMUNITY): Payer: Self-pay

## 2023-08-04 ENCOUNTER — Other Ambulatory Visit: Payer: Self-pay

## 2023-08-04 MED ORDER — INSULIN LISPRO (1 UNIT DIAL) 100 UNIT/ML (KWIKPEN)
12.0000 [IU] | PEN_INJECTOR | Freq: Three times a day (TID) | SUBCUTANEOUS | 0 refills | Status: DC
Start: 2023-08-04 — End: 2023-09-09
  Filled 2023-08-04: qty 15, 34d supply, fill #0

## 2023-08-04 MED ORDER — INSULIN PEN NEEDLE 32G X 4 MM MISC
0 refills | Status: DC
  Filled 2023-08-04: qty 200, 30d supply, fill #0

## 2023-08-05 ENCOUNTER — Other Ambulatory Visit (HOSPITAL_COMMUNITY): Payer: Self-pay

## 2023-08-05 ENCOUNTER — Other Ambulatory Visit: Payer: Self-pay

## 2023-08-08 ENCOUNTER — Other Ambulatory Visit (HOSPITAL_COMMUNITY): Payer: Self-pay

## 2023-08-08 MED ORDER — CELECOXIB 200 MG PO CAPS
200.0000 mg | ORAL_CAPSULE | Freq: Every day | ORAL | 2 refills | Status: DC | PRN
  Filled 2023-08-08: qty 90, 90d supply, fill #0
  Filled 2023-10-10 – 2023-11-03 (×2): qty 90, 90d supply, fill #1
  Filled 2024-02-17: qty 90, 90d supply, fill #2

## 2023-08-10 ENCOUNTER — Other Ambulatory Visit (HOSPITAL_COMMUNITY): Payer: Self-pay

## 2023-08-18 ENCOUNTER — Other Ambulatory Visit (HOSPITAL_COMMUNITY): Payer: Self-pay

## 2023-08-18 MED ORDER — DAPAGLIFLOZIN PROPANEDIOL 5 MG PO TABS
5.0000 mg | ORAL_TABLET | Freq: Every day | ORAL | 0 refills | Status: DC
  Filled 2023-08-18: qty 30, 30d supply, fill #0

## 2023-09-05 ENCOUNTER — Other Ambulatory Visit (HOSPITAL_COMMUNITY): Payer: Self-pay

## 2023-09-06 ENCOUNTER — Encounter (HOSPITAL_COMMUNITY): Payer: Self-pay

## 2023-09-06 ENCOUNTER — Other Ambulatory Visit (HOSPITAL_COMMUNITY): Payer: Self-pay

## 2023-09-09 ENCOUNTER — Other Ambulatory Visit (HOSPITAL_COMMUNITY): Payer: Self-pay

## 2023-09-09 MED ORDER — INSULIN LISPRO (1 UNIT DIAL) 100 UNIT/ML (KWIKPEN)
12.0000 [IU] | PEN_INJECTOR | Freq: Three times a day (TID) | SUBCUTANEOUS | 0 refills | Status: DC
Start: 2023-09-09 — End: 2023-11-02
  Filled 2023-09-09: qty 15, 34d supply, fill #0

## 2023-09-15 ENCOUNTER — Other Ambulatory Visit (HOSPITAL_COMMUNITY): Payer: Self-pay

## 2023-09-15 MED ORDER — DAPAGLIFLOZIN PROPANEDIOL 5 MG PO TABS
5.0000 mg | ORAL_TABLET | Freq: Every day | ORAL | 1 refills | Status: DC
  Filled 2023-09-15 – 2024-01-18 (×3): qty 30, 30d supply, fill #0
  Filled 2024-02-17: qty 30, 30d supply, fill #1

## 2023-09-15 MED ORDER — DAPAGLIFLOZIN PROPANEDIOL 5 MG PO TABS
5.0000 mg | ORAL_TABLET | Freq: Every day | ORAL | 0 refills | Status: DC
  Filled 2023-09-15: qty 30, 30d supply, fill #0

## 2023-10-04 ENCOUNTER — Other Ambulatory Visit (HOSPITAL_COMMUNITY): Payer: Self-pay

## 2023-10-10 ENCOUNTER — Other Ambulatory Visit (HOSPITAL_COMMUNITY): Payer: Self-pay

## 2023-10-10 MED ORDER — DAPAGLIFLOZIN PROPANEDIOL 5 MG PO TABS
5.0000 mg | ORAL_TABLET | Freq: Every day | ORAL | 1 refills | Status: DC
  Filled 2023-10-10: qty 30, 30d supply, fill #0
  Filled 2023-11-02 – 2023-12-16 (×2): qty 30, 30d supply, fill #1

## 2023-10-11 ENCOUNTER — Other Ambulatory Visit (HOSPITAL_COMMUNITY): Payer: Self-pay

## 2023-10-11 MED ORDER — DAPAGLIFLOZIN PROPANEDIOL 5 MG PO TABS
5.0000 mg | ORAL_TABLET | Freq: Every day | ORAL | 0 refills | Status: DC
  Filled 2023-11-02 – 2023-11-03 (×2): qty 30, 30d supply, fill #0

## 2023-11-02 ENCOUNTER — Other Ambulatory Visit (HOSPITAL_COMMUNITY): Payer: Self-pay

## 2023-11-02 ENCOUNTER — Other Ambulatory Visit: Payer: Self-pay

## 2023-11-02 ENCOUNTER — Other Ambulatory Visit: Payer: Self-pay | Admitting: Internal Medicine

## 2023-11-02 DIAGNOSIS — I2510 Atherosclerotic heart disease of native coronary artery without angina pectoris: Secondary | ICD-10-CM

## 2023-11-02 DIAGNOSIS — E785 Hyperlipidemia, unspecified: Secondary | ICD-10-CM

## 2023-11-02 MED ORDER — INSULIN PEN NEEDLE 32G X 4 MM MISC
6 refills | Status: AC
  Filled 2023-11-02: qty 200, 29d supply, fill #0
  Filled 2023-12-20: qty 200, 29d supply, fill #1
  Filled 2024-02-17: qty 200, 29d supply, fill #2
  Filled 2024-03-20 – 2024-03-21 (×3): qty 200, 29d supply, fill #3
  Filled 2024-04-16: qty 200, 29d supply, fill #4
  Filled 2024-05-17: qty 200, 29d supply, fill #5

## 2023-11-02 MED ORDER — INSULIN LISPRO (1 UNIT DIAL) 100 UNIT/ML (KWIKPEN)
12.0000 [IU] | PEN_INJECTOR | Freq: Three times a day (TID) | SUBCUTANEOUS | 5 refills | Status: AC
  Filled 2024-04-16: qty 15, 34d supply, fill #0
  Filled 2024-05-17: qty 15, 34d supply, fill #1

## 2023-11-02 MED ORDER — METFORMIN HCL ER 500 MG PO TB24
1000.0000 mg | ORAL_TABLET | Freq: Every day | ORAL | 5 refills | Status: AC
  Filled 2023-11-02 – 2024-03-20 (×3): qty 60, 30d supply, fill #0

## 2023-11-02 MED ORDER — REPATHA SURECLICK 140 MG/ML ~~LOC~~ SOAJ
140.0000 mg | SUBCUTANEOUS | 3 refills | Status: AC
  Filled 2023-11-02: qty 6, 84d supply, fill #0
  Filled 2024-02-02 – 2024-03-20 (×2): qty 6, 84d supply, fill #1

## 2023-11-02 MED ORDER — TRESIBA FLEXTOUCH 100 UNIT/ML ~~LOC~~ SOPN
60.0000 [IU] | PEN_INJECTOR | Freq: Every day | SUBCUTANEOUS | 3 refills | Status: AC
  Filled 2023-11-02 – 2024-03-20 (×4): qty 18, 30d supply, fill #0

## 2023-11-02 MED ORDER — INSULIN LISPRO (1 UNIT DIAL) 100 UNIT/ML (KWIKPEN)
12.0000 [IU] | PEN_INJECTOR | Freq: Three times a day (TID) | SUBCUTANEOUS | 5 refills | Status: AC
  Filled 2023-11-02: qty 15, 33d supply, fill #0
  Filled 2024-03-20: qty 15, 33d supply, fill #1

## 2023-11-03 ENCOUNTER — Other Ambulatory Visit (HOSPITAL_COMMUNITY): Payer: Self-pay

## 2023-11-03 ENCOUNTER — Other Ambulatory Visit: Payer: Self-pay

## 2023-11-03 ENCOUNTER — Other Ambulatory Visit (HOSPITAL_BASED_OUTPATIENT_CLINIC_OR_DEPARTMENT_OTHER): Payer: Self-pay

## 2023-11-04 ENCOUNTER — Other Ambulatory Visit (HOSPITAL_COMMUNITY): Payer: Self-pay

## 2023-11-04 MED ORDER — DEXCOM G7 SENSOR MISC
11 refills | Status: AC
  Filled 2023-11-04: qty 3, 30d supply, fill #0
  Filled 2023-12-16: qty 3, 30d supply, fill #1
  Filled 2024-02-17: qty 3, 30d supply, fill #2
  Filled 2024-03-20: qty 3, 30d supply, fill #3
  Filled 2024-04-16: qty 3, 30d supply, fill #4
  Filled 2024-05-17: qty 3, 30d supply, fill #5

## 2023-11-08 DIAGNOSIS — E1065 Type 1 diabetes mellitus with hyperglycemia: Secondary | ICD-10-CM | POA: Diagnosis not present

## 2023-11-08 DIAGNOSIS — E785 Hyperlipidemia, unspecified: Secondary | ICD-10-CM | POA: Diagnosis not present

## 2023-11-08 DIAGNOSIS — I10 Essential (primary) hypertension: Secondary | ICD-10-CM | POA: Diagnosis not present

## 2023-11-10 ENCOUNTER — Other Ambulatory Visit (HOSPITAL_COMMUNITY): Payer: Self-pay

## 2023-12-13 ENCOUNTER — Other Ambulatory Visit (HOSPITAL_COMMUNITY): Payer: Self-pay

## 2023-12-13 MED ORDER — TRESIBA FLEXTOUCH 100 UNIT/ML ~~LOC~~ SOPN
50.0000 [IU] | PEN_INJECTOR | Freq: Every morning | SUBCUTANEOUS | 5 refills | Status: DC
  Filled 2023-12-13: qty 15, 30d supply, fill #0
  Filled 2024-01-06: qty 15, 30d supply, fill #1
  Filled 2024-02-02: qty 15, 30d supply, fill #2
  Filled 2024-02-28: qty 15, 30d supply, fill #3
  Filled 2024-03-20: qty 15, 30d supply, fill #4
  Filled 2024-03-22 – 2024-04-18 (×3): qty 15, 30d supply, fill #0
  Filled ????-??-??: fill #4

## 2023-12-16 ENCOUNTER — Other Ambulatory Visit (HOSPITAL_COMMUNITY): Payer: Self-pay

## 2023-12-20 ENCOUNTER — Other Ambulatory Visit: Payer: Self-pay

## 2023-12-20 ENCOUNTER — Other Ambulatory Visit (HOSPITAL_COMMUNITY): Payer: Self-pay

## 2023-12-21 ENCOUNTER — Other Ambulatory Visit (HOSPITAL_COMMUNITY): Payer: Self-pay

## 2023-12-22 ENCOUNTER — Other Ambulatory Visit (HOSPITAL_COMMUNITY): Payer: Self-pay

## 2024-01-06 ENCOUNTER — Other Ambulatory Visit (HOSPITAL_COMMUNITY): Payer: Self-pay

## 2024-01-18 ENCOUNTER — Other Ambulatory Visit (HOSPITAL_COMMUNITY): Payer: Self-pay

## 2024-02-02 ENCOUNTER — Other Ambulatory Visit (HOSPITAL_COMMUNITY): Payer: Self-pay

## 2024-02-08 DIAGNOSIS — M25561 Pain in right knee: Secondary | ICD-10-CM | POA: Diagnosis not present

## 2024-02-09 NOTE — Progress Notes (Deleted)
 Rescheduled

## 2024-02-10 ENCOUNTER — Ambulatory Visit: Admitting: Internal Medicine

## 2024-02-17 ENCOUNTER — Other Ambulatory Visit: Payer: Self-pay

## 2024-02-17 ENCOUNTER — Other Ambulatory Visit (HOSPITAL_COMMUNITY): Payer: Self-pay

## 2024-02-27 ENCOUNTER — Other Ambulatory Visit (HOSPITAL_COMMUNITY): Payer: Self-pay

## 2024-02-29 ENCOUNTER — Other Ambulatory Visit (HOSPITAL_COMMUNITY): Payer: Self-pay

## 2024-03-20 ENCOUNTER — Other Ambulatory Visit: Payer: Self-pay

## 2024-03-20 ENCOUNTER — Other Ambulatory Visit (HOSPITAL_COMMUNITY): Payer: Self-pay

## 2024-03-20 MED ORDER — DAPAGLIFLOZIN PROPANEDIOL 5 MG PO TABS
5.0000 mg | ORAL_TABLET | Freq: Every day | ORAL | 6 refills | Status: DC
  Filled 2024-03-20: qty 30, 30d supply, fill #0
  Filled 2024-04-16: qty 30, 30d supply, fill #1

## 2024-03-21 ENCOUNTER — Other Ambulatory Visit (HOSPITAL_COMMUNITY): Payer: Self-pay

## 2024-03-22 ENCOUNTER — Other Ambulatory Visit: Payer: Self-pay

## 2024-03-22 ENCOUNTER — Other Ambulatory Visit (HOSPITAL_BASED_OUTPATIENT_CLINIC_OR_DEPARTMENT_OTHER): Payer: Self-pay

## 2024-03-22 ENCOUNTER — Other Ambulatory Visit (HOSPITAL_COMMUNITY): Payer: Self-pay

## 2024-03-23 ENCOUNTER — Other Ambulatory Visit (HOSPITAL_BASED_OUTPATIENT_CLINIC_OR_DEPARTMENT_OTHER): Payer: Self-pay

## 2024-03-28 ENCOUNTER — Other Ambulatory Visit (HOSPITAL_BASED_OUTPATIENT_CLINIC_OR_DEPARTMENT_OTHER): Payer: Self-pay

## 2024-04-16 ENCOUNTER — Other Ambulatory Visit: Payer: Self-pay

## 2024-04-16 ENCOUNTER — Other Ambulatory Visit: Payer: Self-pay | Admitting: Physician Assistant

## 2024-04-16 ENCOUNTER — Other Ambulatory Visit (HOSPITAL_COMMUNITY): Payer: Self-pay

## 2024-04-16 ENCOUNTER — Other Ambulatory Visit: Payer: Self-pay | Admitting: Internal Medicine

## 2024-04-16 MED ORDER — LOSARTAN POTASSIUM 50 MG PO TABS
50.0000 mg | ORAL_TABLET | Freq: Every day | ORAL | 6 refills | Status: AC
  Filled 2024-04-16: qty 30, 30d supply, fill #0

## 2024-04-17 ENCOUNTER — Other Ambulatory Visit (HOSPITAL_COMMUNITY): Payer: Self-pay

## 2024-04-18 ENCOUNTER — Other Ambulatory Visit: Payer: Self-pay

## 2024-04-18 ENCOUNTER — Other Ambulatory Visit (HOSPITAL_COMMUNITY): Payer: Self-pay

## 2024-04-23 NOTE — Progress Notes (Deleted)
 Patient rescheduled appt

## 2024-04-24 ENCOUNTER — Ambulatory Visit: Admitting: Internal Medicine
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
## 2024-05-01 ENCOUNTER — Other Ambulatory Visit: Payer: Self-pay | Admitting: Cardiology

## 2024-05-01 ENCOUNTER — Encounter: Payer: Self-pay | Admitting: Cardiology

## 2024-05-01 ENCOUNTER — Ambulatory Visit: Attending: Nurse Practitioner | Admitting: Cardiology

## 2024-05-01 VITALS — BP 126/66 | HR 76 | Ht 72.0 in | Wt 227.0 lb

## 2024-05-01 DIAGNOSIS — E7800 Pure hypercholesterolemia, unspecified: Secondary | ICD-10-CM

## 2024-05-01 DIAGNOSIS — I10 Essential (primary) hypertension: Secondary | ICD-10-CM

## 2024-05-01 DIAGNOSIS — I2510 Atherosclerotic heart disease of native coronary artery without angina pectoris: Secondary | ICD-10-CM

## 2024-05-01 NOTE — Patient Instructions (Signed)
 Medication Instructions:  Your physician recommends that you continue on your current medications as directed. Please refer to the Current Medication list given to you today.  *If you need a refill on your cardiac medications before your next appointment, please call your pharmacy*  Lab Work: NONE ordered at this time of appointment   Testing/Procedures: NONE ordered at this time of appointment   Follow-Up: At [facility], you and your health needs are our priority.  As part of our continuing mission to provide you with exceptional heart care, our providers are all part of one team.  This team includes your primary Cardiologist (physician) and Advanced Practice Providers or APPs (Physician Assistants and Nurse Practitioners) who all work together to provide you with the care you need, when you need it.  Your next appointment:    1 year   Provider:   Vina Gull, MD or APP   We recommend signing up for the patient portal called MyChart.  Sign up information is provided on this After Visit Summary.  MyChart is used to connect with patients for Virtual Visits (Telemedicine).  Patients are able to view lab/test results, encounter notes, upcoming appointments, etc.  Non-urgent messages can be sent to your provider as well.   To learn more about what you can do with MyChart, go to [email or web address].

## 2024-05-01 NOTE — Progress Notes (Deleted)
 Error

## 2024-05-08 ENCOUNTER — Other Ambulatory Visit (HOSPITAL_COMMUNITY): Payer: Self-pay

## 2024-05-17 ENCOUNTER — Other Ambulatory Visit (HOSPITAL_COMMUNITY): Payer: Self-pay

## 2024-05-17 ENCOUNTER — Other Ambulatory Visit: Payer: Self-pay

## 2024-05-17 MED ORDER — TRESIBA FLEXTOUCH 100 UNIT/ML ~~LOC~~ SOPN
50.0000 [IU] | PEN_INJECTOR | Freq: Every morning | SUBCUTANEOUS | 5 refills | Status: AC
  Filled 2024-05-17: qty 15, 30d supply, fill #0

## 2024-05-17 MED ORDER — DAPAGLIFLOZIN PROPANEDIOL 5 MG PO TABS
5.0000 mg | ORAL_TABLET | Freq: Every day | ORAL | 6 refills | Status: AC
  Filled 2024-05-17: qty 30, 30d supply, fill #0

## 2024-05-18 ENCOUNTER — Other Ambulatory Visit (HOSPITAL_COMMUNITY): Payer: Self-pay
